# Patient Record
Sex: Female | Born: 1990 | Race: White | Hispanic: No | Marital: Married | Smoking: Never smoker
Health system: Southern US, Community
[De-identification: ages and names within clinical notes are randomized; demographics above are authoritative.]

## PROBLEM LIST (undated history)

## (undated) DIAGNOSIS — G43109 Migraine with aura, not intractable, without status migrainosus: Secondary | ICD-10-CM

## (undated) DIAGNOSIS — A6000 Herpesviral infection of urogenital system, unspecified: Secondary | ICD-10-CM

## (undated) DIAGNOSIS — F32A Depression, unspecified: Secondary | ICD-10-CM

## (undated) HISTORY — DX: Herpesviral infection of urogenital system, unspecified: A60.00

## (undated) HISTORY — DX: Depression, unspecified: F32.A

## (undated) HISTORY — DX: Migraine with aura, not intractable, without status migrainosus: G43.109

## (undated) HISTORY — PX: WISDOM TOOTH EXTRACTION: SHX21

---
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
## 2021-01-19 ENCOUNTER — Telehealth: Payer: Self-pay | Admitting: *Deleted

## 2021-01-19 NOTE — Telephone Encounter (Signed)
Left patient an urgent message to call the office to give and receive office information for New GYN appointment on 02/10/2021.

## 2021-02-07 NOTE — Progress Notes (Deleted)
   GYNECOLOGY OFFICE VISIT NOTE  History:   Monique Pruitt is a 30 y.o. No obstetric history on file. here today for multiple concerns:  Painful and heavy periods Recurrent UTIs    She denies any abnormal vaginal discharge, bleeding, pelvic pain or other concerns.     No past medical history on file.  *** The histories are not reviewed yet. Please review them in the "History" navigator section and refresh this SmartLink.  The following portions of the patient's history were reviewed and updated as appropriate: allergies, current medications, past family history, past medical history, past social history, past surgical history and problem list.   Health Maintenance:  Normal pap and negative HRHPV on ***.    Review of Systems:  Pertinent items noted in HPI and remainder of comprehensive ROS otherwise negative.  Physical Exam:  There were no vitals taken for this visit. CONSTITUTIONAL: Well-developed, well-nourished female in no acute distress.  HEENT:  Normocephalic, atraumatic. External right and left ear normal. No scleral icterus.  NECK: Normal range of motion, supple, no masses noted on observation SKIN: No rash noted. Not diaphoretic. No erythema. No pallor. MUSCULOSKELETAL: Normal range of motion. No edema noted. NEUROLOGIC: Alert and oriented to person, place, and time. Normal muscle tone coordination. No cranial nerve deficit noted. PSYCHIATRIC: Normal mood and affect. Normal behavior. Normal judgment and thought content.  CARDIOVASCULAR: Normal heart rate noted RESPIRATORY: Effort and breath sounds normal, no problems with respiration noted ABDOMEN: No masses noted. No other overt distention noted.    PELVIC: {Blank single:19197::"Deferred","Normal appearing external genitalia; normal urethral meatus; normal appearing vaginal mucosa and cervix.  No abnormal discharge noted.  Normal uterine size, no other palpable masses, no uterine or adnexal tenderness. Performed  in the presence of a chaperone"}  Labs and Imaging No results found for this or any previous visit (from the past 168 hour(s)). No results found.    Assessment and Plan:    There are no diagnoses linked to this encounter.  Routine preventative health maintenance measures emphasized. Please refer to After Visit Summary for other counseling recommendations.   No follow-ups on file.    I spent {Blank single:19197::"10","15","20","25","30"} minutes dedicated to the care of this patient including pre-visit review of records, face to face time with the patient discussing her conditions and treatments and post visit orders.    Milas Hock, MD, FACOG Obstetrician & Gynecologist, [facility] for [facility], [facility]

## 2021-02-09 ENCOUNTER — Telehealth: Payer: Self-pay | Admitting: *Deleted

## 2021-02-09 NOTE — Telephone Encounter (Signed)
Left patient an urgent message to call the office prior to appointment on 02/10/2021 at 2:10 PM for prior appointment questions.

## 2021-02-10 ENCOUNTER — Encounter: Payer: Commercial Managed Care - PPO | Admitting: Obstetrics and Gynecology

## 2021-03-01 ENCOUNTER — Emergency Department (INDEPENDENT_AMBULATORY_CARE_PROVIDER_SITE_OTHER): Payer: Commercial Managed Care - PPO

## 2021-03-01 ENCOUNTER — Emergency Department (INDEPENDENT_AMBULATORY_CARE_PROVIDER_SITE_OTHER)
Admission: RE | Admit: 2021-03-01 | Discharge: 2021-03-01 | Disposition: A | Discharge status: Home / Self Care | Payer: Commercial Managed Care - PPO | Source: Ambulatory Visit | Attending: Family Medicine | Admitting: Family Medicine

## 2021-03-01 ENCOUNTER — Other Ambulatory Visit: Payer: Self-pay

## 2021-03-01 VITALS — BP 126/86 | HR 87 | Temp 99.1°F

## 2021-03-01 DIAGNOSIS — M25562 Pain in left knee: Secondary | ICD-10-CM | POA: Diagnosis not present

## 2021-03-01 DIAGNOSIS — S83002A Unspecified subluxation of left patella, initial encounter: Secondary | ICD-10-CM

## 2021-03-01 MED ORDER — NAPROXEN SODIUM 550 MG PO TABS: 550.0000 mg | ORAL_TABLET | Freq: Two times a day (BID) | ORAL | 0 refills | Status: DC

## 2021-03-01 NOTE — Discharge Instructions (Signed)
Use ice for 20 minutes every couple of hours Take the Anaprox 2 times a day with food Stay off your leg as long as it is painful.  Hopefully this will improve over the next week or so See sports medicine if you fail to improve

## 2021-03-01 NOTE — ED Triage Notes (Addendum)
Pt c/o LT knee pain since Sunday afternoon when she twisted her knee. Says it appears swollen. Hurts to bend leg. Pain 3/10, worse when bending. Ibuprofen and ice prn.

## 2021-03-01 NOTE — ED Provider Notes (Signed)
[facility]    CSN: [id number] Arrival date & time: 03/01/21  1287      History   Chief Complaint Chief Complaint  Patient presents with   Knee Injury    LT    HPI Mikelle Myrick is a 30 y.o. female.   HPI  Patient states she was outdoors in a field, on uneven ground.  She was collecting chestnuts with a friend.  The friend tossed her 1, and as she reached to grab it she felt a twisting and pain in her left knee.  She fell to the ground.  Unable to fully weight-bear since that time.  Knee is very painful, swollen.  No prior knee problems.  Healthy 30 year old on no medications.  History reviewed. No pertinent past medical history.  There are no problems to display for this patient.   History reviewed. No pertinent surgical history.  OB History   No obstetric history on file.      Home Medications    Prior to Admission medications   Medication Sig Start Date End Date Taking? Authorizing Provider  naproxen sodium (ANAPROX DS) 550 MG tablet Take 1 tablet (550 mg total) by mouth 2 (two) times daily with a meal. 03/01/21  Yes Eustace Moore, MD    Family History History reviewed. No pertinent family history.  Social History Social History   Tobacco Use   Smoking status: Never   Smokeless tobacco: Never  Vaping Use   Vaping Use: Never used  Substance Use Topics   Alcohol use: Yes    Comment: occ     Allergies   Patient has no known allergies.   Review of Systems Review of Systems See HPI  Physical Exam Triage Vital Signs ED Triage Vitals  Enc Vitals Group     BP 03/01/21 0851 126/86     Pulse Rate 03/01/21 0851 87     Resp --      Temp 03/01/21 0851 99.1 F (37.3 C)     Temp Source 03/01/21 0851 Oral     SpO2 03/01/21 0851 96 %     Weight --      Height --      Head Circumference --      Peak Flow --      Pain Score 03/01/21 0853 3     Pain Loc --      Pain Edu? --      Excl. in GC? --    No data  found.  Updated Vital Signs BP 126/86 (BP Location: Right Arm)   Pulse 87   Temp 99.1 F (37.3 C) (Oral)   LMP 01/30/2021 (Exact Date)   SpO2 96%      Physical Exam Constitutional:      General: She is not in acute distress.    Appearance: She is well-developed.  HENT:     Head: Normocephalic and atraumatic.  Eyes:     Conjunctiva/sclera: Conjunctivae normal.     Pupils: Pupils are equal, round, and reactive to light.  Cardiovascular:     Rate and Rhythm: Normal rate.  Pulmonary:     Effort: Pulmonary effort is normal. No respiratory distress.  Abdominal:     General: There is no distension.     Palpations: Abdomen is soft.  Musculoskeletal:        General: Normal range of motion.     Cervical back: Normal range of motion.     Comments: Left knee has effusion.  Full  extension, can flex to 100 degrees.  No instability.  There is tenderness around the patella and pain with any patellar motion.  Skin:    General: Skin is warm and dry.  Neurological:     Mental Status: She is alert.     Gait: Gait abnormal.     UC Treatments / Results  Labs (all labs ordered are listed, but only abnormal results are displayed) Labs Reviewed - No data to display  EKG   Radiology DG Knee AP/LAT W/Sunrise Left  Result Date: 03/01/2021 CLINICAL DATA:  Possible patellar subluxation, left knee pain since Sunday EXAM: LEFT KNEE 3 VIEWS COMPARISON:  None. FINDINGS: There is no acute fracture or dislocation. Knee alignment is normal. Specifically, the patella is normally seated within the patellar groove. There is no effusion. The soft tissues are unremarkable. IMPRESSION: No acute fracture or dislocation.  Normal position of the patella. Electronically Signed   By: Lesia Hausen M.D.   On: 03/01/2021 09:58    Procedures Procedures (including critical care time)  Medications Ordered in UC Medications - No data to display  Initial Impression / Assessment and Plan / UC Course  I have  reviewed the triage vital signs and the nursing notes.  Pertinent labs & imaging results that were available during my care of the patient were reviewed by me and considered in my medical decision making (see chart for details).     Suspect patellar subluxation.  History and physical exam suggest.  I showed her some quadricep strengthening exercises that can be done seated, straight leg raise.  Follow-up with sports medicine Final Clinical Impressions(s) / UC Diagnoses   Final diagnoses:  Patellar subluxation, left, initial encounter     Discharge Instructions      Use ice for 20 minutes every couple of hours Take the Anaprox 2 times a day with food Stay off your leg as long as it is painful.  Hopefully this will improve over the next week or so See sports medicine if you fail to improve     ED Prescriptions     Medication Sig Dispense Auth. Provider   naproxen sodium (ANAPROX DS) 550 MG tablet Take 1 tablet (550 mg total) by mouth 2 (two) times daily with a meal. 30 tablet Eustace Moore, MD      PDMP not reviewed this encounter.   Eustace Moore, MD 03/01/21 1149

## 2021-03-07 ENCOUNTER — Telehealth: Payer: Self-pay | Admitting: *Deleted

## 2021-03-07 NOTE — Telephone Encounter (Signed)
Left patient a message with scheduled appointment information and location.

## 2021-03-15 ENCOUNTER — Encounter: Payer: Self-pay | Admitting: Obstetrics and Gynecology

## 2021-03-15 DIAGNOSIS — G43109 Migraine with aura, not intractable, without status migrainosus: Secondary | ICD-10-CM

## 2021-03-15 HISTORY — DX: Migraine with aura, not intractable, without status migrainosus: G43.109

## 2021-03-15 NOTE — Progress Notes (Signed)
GYNECOLOGY OFFICE VISIT NOTE  History:   Monique Pruitt is a 30 y.o. G0 here today for multiple concerns.   Painful heavy periods - since 19. For the first 3 days. Heating pad, ibuprofen/tylenol. Does 400mg  of ibu. Cycles are 36. + fmh of endometriosis. Taking iron which seems to be helpful. Uses the menstrual disc - empties each time she goes to the bathroom. When she used pads/tampons she used to change them between q2-4 with super tampons.  PMS - tracking for few months. Anxiety and depression, low energy after ovulation. Affects work and anhedonia. Happens more right after ovulation.  Recurrent UTIs - the last Ucx I have on file in [place] is 2020. In 2020, they thought at [facility] that perhaps she has IC. By symptoms was 2 months ago. Over a year ago, during covid she didn't feel comfortable getting in person care - has them multiple times and done over the telehelath. Saw an herbalist for 2 months ago, started on some supplement and resolved.    [facility] + Ovarian cancer - MGM.   She denies any abnormal vaginal discharge, bleeding, pelvic pain or other concerns.     Past Medical History:  Diagnosis Date   Depression    Genital HSV    Migraine headache with aura    Remote; last experienced over 5 years ago    Past Surgical History:  Procedure Laterality Date   WISDOM TOOTH EXTRACTION      The following portions of the patient's history were reviewed and updated as appropriate: allergies, current medications, past family history, past medical history, past social history, past surgical history and problem list.   Health Maintenance:   Normal pap 11/2018 in care [facility] at [facility].    Review of Systems:  Pertinent items noted in HPI and remainder of comprehensive ROS otherwise negative.  Physical Exam:  BP 115/78   Pulse 83   Ht 5\' 4"  (1.626 m)   Wt 168 lb (76.2 kg)   LMP 03/02/2021   BMI 28.84 kg/m  CONSTITUTIONAL: Well-developed, well-nourished female in no acute  distress.  HEENT:  Normocephalic, atraumatic. External right and left ear normal. No scleral icterus.  NECK: Normal range of motion, supple, no masses noted on observation SKIN: No rash noted. Not diaphoretic. No erythema. No pallor. MUSCULOSKELETAL: Normal range of motion. No edema noted. NEUROLOGIC: Alert and oriented to person, place, and time. Normal muscle tone coordination. No cranial nerve deficit noted. PSYCHIATRIC: Normal mood and affect. Normal behavior. Normal judgment and thought content.  CARDIOVASCULAR: Normal heart rate noted RESPIRATORY: Effort and breath sounds normal, no problems with respiration noted ABDOMEN: No masses noted. No other overt distention noted.    PELVIC: Deferred  Labs and Imaging  Assessment and Plan:    1. Painful menstrual periods - Discussed options for comfort - continued heating pad/tylenol/ibuprofen - Discussed may be endometriosis vs primary dysmenorrhea.  - Reduction in bleeding amount may be beneficial.  - Check pelvic to confirm no endometrioma. Discussed limit of 03/04/2021 for diagnosing endometriosis but goal would be to rule out need for surgical intervention.   2. Menorrhagia with regular cycle - She would like to continue lifestyle changes but also interested in [place]. We also discussed hormonal options. She does not wish to take medicine regularly at this time.   3. Recurrent UTI - Not a current issue - recent UTI improved without ABX - We will check Ucx today as a precaution  4. PMS (premenstrual syndrome) -  Discussed different options available. Discussed two main options include hormonal birth control and SSRI i.e. prozac.  - For hormonal birth control, Dianah Field is FDA approved but other birth control may help as well.  Discussed risk of VTE with Yaz. Discussed may do an option like Mirena to make periods light or no period at all and may help with mood as well although not FDA approved but can combine birth control with SSRI as well.   - Discussed Prozac is typically taken the week before the period and has shown benefit for these symptoms. Discussed difference in dosing from depression. She could move to when her symptoms peak as well.  - Reviewed trial and error component of management and to give each option about 2-3 months to determine effectiveness.  - She would like Expectant management and lifestyle changes at this time  - She would also like nutrition referral   5. Migraine with aura and without status migrainosus, not intractable - Not an active problem at this time but we discussed risk of combined OCP risk of stroke even with prior history and not active.    Routine preventative health maintenance measures emphasized. Please refer to After Visit Summary for other counseling recommendations.   Return in about 3 months (around 06/17/2021).   Milas Hock, MD, FACOG Obstetrician & Gynecologist, [facility] for [facility], [facility]

## 2021-03-17 ENCOUNTER — Encounter: Payer: Self-pay | Admitting: Obstetrics and Gynecology

## 2021-03-17 ENCOUNTER — Ambulatory Visit (INDEPENDENT_AMBULATORY_CARE_PROVIDER_SITE_OTHER): Payer: Commercial Managed Care - PPO | Admitting: Obstetrics and Gynecology

## 2021-03-17 ENCOUNTER — Other Ambulatory Visit: Payer: Self-pay

## 2021-03-17 VITALS — BP 115/78 | HR 83 | Ht 64.0 in | Wt 168.0 lb

## 2021-03-17 DIAGNOSIS — G43109 Migraine with aura, not intractable, without status migrainosus: Secondary | ICD-10-CM

## 2021-03-17 DIAGNOSIS — N946 Dysmenorrhea, unspecified: Secondary | ICD-10-CM | POA: Diagnosis not present

## 2021-03-17 DIAGNOSIS — N943 Premenstrual tension syndrome: Secondary | ICD-10-CM | POA: Diagnosis not present

## 2021-03-17 DIAGNOSIS — N390 Urinary tract infection, site not specified: Secondary | ICD-10-CM | POA: Diagnosis not present

## 2021-03-17 DIAGNOSIS — N920 Excessive and frequent menstruation with regular cycle: Secondary | ICD-10-CM | POA: Diagnosis not present

## 2021-03-17 MED ORDER — TRANEXAMIC ACID 650 MG PO TABS: 1300.0000 mg | ORAL_TABLET | Freq: Three times a day (TID) | ORAL | 2 refills | Status: DC

## 2021-03-17 NOTE — Progress Notes (Signed)
Pt states periods are regular but painful

## 2021-03-19 LAB — URINE CULTURE
MICRO NUMBER:: 12624537
Result:: NO GROWTH
SPECIMEN QUALITY:: ADEQUATE

## 2021-03-22 ENCOUNTER — Encounter: Payer: Commercial Managed Care - PPO | Attending: Obstetrics and Gynecology | Admitting: Registered"

## 2021-03-22 ENCOUNTER — Other Ambulatory Visit: Payer: Self-pay

## 2021-03-22 ENCOUNTER — Ambulatory Visit: Payer: Commercial Managed Care - PPO | Admitting: Registered"

## 2021-03-22 DIAGNOSIS — N946 Dysmenorrhea, unspecified: Secondary | ICD-10-CM | POA: Insufficient documentation

## 2021-03-22 NOTE — Progress Notes (Signed)
Medical Nutrition Therapy  Appointment Start time:  [phone]  Appointment End time:  1010  Primary concerns today: PMS, painful periods  Referral diagnosis: N94.6 (ICD-10-CM) - Painful menstrual periods Preferred learning style: no preference indicated Learning readiness: ready  NUTRITION ASSESSMENT   Anthropometrics  Wt Readings from Last 3 Encounters:  03/17/21 168 lb (76.2 kg)      Clinical Medical Hx: migraine, painful menstrual periods Medications: reviewed Labs: no recent blood work Notable Signs/Symptoms: hypoglycemic sxs, painful periods  Lifestyle & Dietary Hx Pt states last summer she consulted an herbalist and started B complex and iron supplement. Pt states she did not notice much difference except periods were a little lighter flow and brighter red. Pt state sfirst 3 days of period feels like an internal blender. Pt reports she will be tested for endometriosis soon.   Pt also reports drop in mood after ovulation.   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 Pt works from home 3x/week.  Physical activity: ADLs x1 month due to knee injury. Pt state prior to that was going to gym 3x/week with 30 min cardio/30 min weights.  Estimated daily fluid intake: 56-72 oz Supplements: none (was B complex and iron) Sleep: 7-9 hrs (feels better with 9 hours) Stress / self-care: 4/10 Current average weekly physical activity: ADL  24-Hr Dietary Recall First Meal: Bagel with cream cheese OR fruit Snack: fruit and peanut butter OR chips Second Meal: left overs OR goes to deli nearby work Snack: same Third Meal: soup or chili or pasta Snack: piece of chocolate and whole milk Beverages: mostly water, some milk and orange juice  Estimated Energy Needs Calories: 2200   NUTRITION DIAGNOSIS  NI-5.8.5 Inadeqate fiber intake As related to lack of whole grains and daily vegetable intake.  As evidenced by dietary recall.   NUTRITION INTERVENTION  Nutrition education  (E-1) on the following topics:  Balanced eating  Whole grains importance in diet and sources Foods that may help increase serotonin Foods with iron Meal planning Exercise role in mood  Handouts Provided Include  AND Iron deficiency MNT  Learning Style & Readiness for Change Teaching method utilized: Visual & Auditory  Demonstrated degree of understanding via: Teach Back  Barriers to learning/adherence to lifestyle change: none  Goals Established by Pt Aim for 2 vegetable servings per day, mix of starchy and non-starchy.  Consider including more [place], salmon, nuts, seeds, eggs.  Whole grains: try some new each week [employer]  Breakfast: Plan ahead for the week for breakfast ideas: Plain [place] yogurt, berries, granola Overnight oats, chia seeds or flaxseeds, protein powder (PB fit, whey protein powder, or plant based such as orgain) Egg bites (Homemade), hardboiled, with meat [place] sausage (home made: 1/2 lb burger 1 tsp sage, 1/2 tsp cinnamon, 1/2 tsp thyme, 1/2 tsp salt, (1/2 rosemary, optional) Whole grain toast with PB - (sprouted wheat is delicious)  Lunch: More leftovers  Sodium: read labels    MONITORING & EVALUATION Dietary intake, weekly physical activity, and PMS sxs in prn.  Next Steps  Patient is to contact office if would like follow-up appt.

## 2021-03-22 NOTE — Patient Instructions (Signed)
Aim for 2 vegetable servings per day, mix of starchy and non-starchy.  Consider including more [place], salmon, nuts, seeds, eggs.  Whole grains: try some new each week [employer]  Breakfast: Plan ahead for the week for breakfast ideas: Plain [place] yogurt, berries, granola Overnight oats, chia seeds or flaxseeds, protein powder (PB fit, whey protein powder, or plant based such as orgain) Egg bites (Homemade), hardboiled, with meat [place] sausage (home made: 1/2 lb burger 1 tsp sage, 1/2 tsp cinnamon, 1/2 tsp thyme, 1/2 tsp salt, (1/2 rosemary, optional) Whole grain toast with PB - (sprouted wheat is delicious)  Lunch: More leftovers  Sodium: read labels

## 2021-03-28 ENCOUNTER — Other Ambulatory Visit: Payer: Self-pay

## 2021-06-17 ENCOUNTER — Telehealth: Payer: Self-pay | Admitting: *Deleted

## 2021-06-17 NOTE — Telephone Encounter (Signed)
Left patient a message to call and schedule. 

## 2021-10-06 NOTE — Progress Notes (Signed)
ANNUAL EXAM Patient name: Monique Pruitt MRN [id number]  Date of birth: 1991-02-12 Chief Complaint:   Annual Exam  History of Present Illness:   Monique Pruitt is a 31 y.o. G0P0000 female being seen today for a routine annual exam.   Current complaints: Since last seen her periods have been about the same but now she had significant pain with intercourse which was most in May. Her pain has eased some this month. She did not yet get the [place] but would still like to go. She declined intervention at the time in November opting for expectant management/lifestyle changes. She is tearful today regarding the pain she is experiencing.   Patient's last menstrual period was 09/27/2021.  Last pap 11/2018. Results were: NILM w/ HRHPV not done. H/O abnormal pap: no  Health Maintenance Due  Topic Date Due   COVID-19 Vaccine (1) Never done   HIV Screening  Never done   Hepatitis C Screening  Never done   PAP-Cervical Cytology Screening  Never done   PAP SMEAR-Modifier  Never done   TETANUS/TDAP  08/14/2016         View : No data to display.               View : No data to display.           Review of Systems:   Pertinent items are noted in HPI Denies any headaches, blurred vision, fatigue, shortness of breath, chest pain, abdominal pain, abnormal vaginal discharge/itching/odor/irritation, bowel movements, urination, or intercourse unless otherwise stated above.  Pertinent History Reviewed:  Reviewed past medical,surgical, social and family history.  Reviewed problem list, medications and allergies. Physical Assessment:   Vitals:   10/10/21 1343  BP: 127/71  Pulse: 76  Resp: 16  Weight: 162 lb (73.5 kg)  Height: 5\' 4"  (1.626 m)  Body mass index is 27.81 kg/m.   Physical Examination:  General appearance - well appearing, and in no distress Mental status - alert, oriented to person, place, and time Psych:  She has a normal mood and affect Skin - warm and  dry, normal color, no suspicious lesions noted Chest - effort normal, all lung fields clear to auscultation bilaterally Heart - normal rate and regular rhythm Neck:  midline trachea, no thyromegaly or nodules Breasts - breasts appear normal, no suspicious masses, no skin or nipple changes or axillary nodes Abdomen - soft, nontender, nondistended, no masses or organomegaly Pelvic -  VULVA: normal appearing vulva with no masses, tenderness or lesions  VAGINA: normal appearing vagina with normal color and discharge, no lesions, bilateral levator tenderness and left>right and obturator tender CERVIX: normal appearing cervix without discharge or lesions, no CMT UTERUS: uterus is felt to be normal size, shape, consistency and nontender  ADNEXA: No adnexal masses noted. Tenderness noted bilaterally but especially on the left Extremities:  No swelling or varicosities noted  Chaperone present for exam  No results found for this or any previous visit (from the past 24 hour(s)).  Assessment & Plan:  Hula was seen today for annual exam.  Diagnoses and all orders for this visit:  Encounter for annual routine gynecological examination - Cervical cancer screening: Discussed screening Q3 years. Reviewed importance of annual exams and limits of pap smear. Pap with reflex HPV  - GC/CT: Discussed and recommended. Pt  declines - Gardasil: completed - Birth Control: Declines - Breast Health: Encouraged self breast awareness/exams. Teaching provided. - Follow-up: 12 months and prn -  Cytology - PAP( [place])  Painful menstrual periods - Discussed differential: i.e. secondary dysmenorrhea vs endometriosis vs other etiology. Most likely she has endometriosis.  - Discussed only way to tell definitively regarding endometriosis is either by US showing an endometrioma or by laparoscopy. Would advise against surgery unless other treatment options have failed or concerns regarding infertility. -  Discussed treatment options: Motrin or Naprosyn prn severe cramps along with tylenol, hormonal options. We discussed the role of PFPT.  - We also discussed possible prevention of progression of endometriosis if hormonal therapy.  - We reviewed use of laparoscopy in the assistance of fertility if endometriosis but that would only be after she has tried for at least 6 months.  - At this time, she would like to do: PFPT and NSAIDs -     naproxen (NAPROSYN) 500 MG tablet; Take 1 tablet (500 mg total) by mouth 2 (two) times daily with a meal. As needed for pain - She will also go for TVUS -     Ambulatory referral to Physical Therapy  Pelvic pain -     Cancel: Ambulatory referral to Physical Therapy -     naproxen (NAPROSYN) 500 MG tablet; Take 1 tablet (500 mg total) by mouth 2 (two) times daily with a meal. As needed for pain -     Ambulatory referral to Physical Therapy      Orders Placed This Encounter  Procedures   Ambulatory referral to Physical Therapy    Meds:  Meds ordered this encounter  Medications   naproxen (NAPROSYN) 500 MG tablet    Sig: Take 1 tablet (500 mg total) by mouth 2 (two) times daily with a meal. As needed for pain    Dispense:  60 tablet    Refill:  2    Follow-up: No follow-ups on file.  Milas Hock, MD 10/10/2021 2:44 PM

## 2021-10-10 ENCOUNTER — Encounter: Payer: Self-pay | Admitting: Obstetrics and Gynecology

## 2021-10-10 ENCOUNTER — Ambulatory Visit (INDEPENDENT_AMBULATORY_CARE_PROVIDER_SITE_OTHER): Payer: Commercial Managed Care - PPO | Admitting: Obstetrics and Gynecology

## 2021-10-10 ENCOUNTER — Other Ambulatory Visit (HOSPITAL_COMMUNITY)
Admission: RE | Admit: 2021-10-10 | Discharge: 2021-10-10 | Disposition: A | Discharge status: Home / Self Care | Payer: Commercial Managed Care - PPO | Source: Ambulatory Visit | Attending: Obstetrics and Gynecology | Admitting: Obstetrics and Gynecology

## 2021-10-10 VITALS — BP 127/71 | HR 76 | Resp 16 | Ht 64.0 in | Wt 162.0 lb

## 2021-10-10 DIAGNOSIS — Z01419 Encounter for gynecological examination (general) (routine) without abnormal findings: Secondary | ICD-10-CM

## 2021-10-10 DIAGNOSIS — N946 Dysmenorrhea, unspecified: Secondary | ICD-10-CM

## 2021-10-10 DIAGNOSIS — R102 Pelvic and perineal pain: Secondary | ICD-10-CM | POA: Diagnosis not present

## 2021-10-10 MED ORDER — NAPROXEN 500 MG PO TABS: 500.0000 mg | ORAL_TABLET | Freq: Two times a day (BID) | ORAL | 2 refills | Status: DC

## 2021-10-11 ENCOUNTER — Encounter: Payer: Self-pay | Admitting: Physical Therapy

## 2021-10-11 ENCOUNTER — Ambulatory Visit: Payer: Commercial Managed Care - PPO | Attending: Obstetrics and Gynecology | Admitting: Physical Therapy

## 2021-10-11 ENCOUNTER — Encounter: Payer: Self-pay | Admitting: Obstetrics and Gynecology

## 2021-10-11 DIAGNOSIS — M6281 Muscle weakness (generalized): Secondary | ICD-10-CM | POA: Diagnosis present

## 2021-10-11 DIAGNOSIS — N946 Dysmenorrhea, unspecified: Secondary | ICD-10-CM | POA: Insufficient documentation

## 2021-10-11 DIAGNOSIS — M62838 Other muscle spasm: Secondary | ICD-10-CM | POA: Diagnosis present

## 2021-10-11 DIAGNOSIS — R102 Pelvic and perineal pain: Secondary | ICD-10-CM | POA: Insufficient documentation

## 2021-10-11 DIAGNOSIS — R293 Abnormal posture: Secondary | ICD-10-CM | POA: Insufficient documentation

## 2021-10-11 DIAGNOSIS — R279 Unspecified lack of coordination: Secondary | ICD-10-CM

## 2021-10-11 LAB — CYTOLOGY - PAP: Diagnosis: NEGATIVE

## 2021-10-11 NOTE — Therapy (Signed)
OUTPATIENT PHYSICAL THERAPY FEMALE PELVIC EVALUATION   Patient Name: Monique Pruitt MRN: [id number] DOB:[birth year], 31 y.o., female Today's Date: 10/11/2021   PT End of Session - 10/11/21 0845     Visit Number 1    Date for PT Re-Evaluation 01/11/22    Authorization Type [employer]    PT Start Time 0845    PT Stop Time 0923    PT Time Calculation (min) 38 min    Activity Tolerance Patient tolerated treatment well    Behavior During Therapy WFL for tasks assessed/performed             Past Medical History:  Diagnosis Date   Depression    Genital HSV    Migraine headache with aura    Remote; last experienced over 5 years ago   Past Surgical History:  Procedure Laterality Date   WISDOM TOOTH EXTRACTION     Patient Active Problem List   Diagnosis Date Noted   Painful menstrual periods 03/22/2021   Migraine with aura 03/15/2021   Acne 12/29/2008   Soft tissue lesion of shoulder region 02/17/2008    PCP: none per chart  REFERRING PROVIDER: Milas Hock, MD   REFERRING DIAG: N94.6 (ICD-10-CM) - Painful menstrual periods R10.2 (ICD-10-CM) - Pelvic pain  THERAPY DIAG:  Muscle weakness (generalized)  Unspecified lack of coordination  Other muscle spasm  Rationale for Evaluation and Treatment Rehabilitation  ONSET DATE: one month  SUBJECTIVE:                                                                                                                                                                                           SUBJECTIVE STATEMENT: Pt reports she has had worsening pelvic pain starting with painful periods for years but now having soreness all the time, very painful intercourse for the past month. Periods pain has stayed the same.  Fluid intake: Yes: 48 oz of water per day, sometimes tea    Patient confirms identification and approves PT to assess pelvic floor and treatment Yes   PAIN:  Are you having pain? Yes NPRS scale: 7/10 at  worst Pain location:  abdominal Lt lower quadrant  Pain type: sharp Pain description:  sharp/severity is intermittent but soreness is constant    Aggravating factors: intercourse, periods  Relieving factors: with periods a heating pad helps this pain nothing has been helping  PRECAUTIONS: None  WEIGHT BEARING RESTRICTIONS No  FALLS:  Has patient fallen in last 6 months? No  LIVING ENVIRONMENT: Lives with: lives with their partner Lives in: House/apartment   OCCUPATION: patent Engineer, [employer]  PLOF: Independent  PATIENT GOALS to have  less pain  PERTINENT HISTORY:  Possible endometriosis  Sexual abuse: No  BOWEL MOVEMENT Pain with bowel movement: No Type of bowel movement:Type (Bristol Stool Scale) 4, Frequency 5x per week, and Strain No Fully empty rectum: No Leakage: No Pads: No Fiber supplement: No  URINATION Pain with urination: No Fully empty bladder: Yes:   Stream: Strong Urgency: No Frequency: every 2 hours Leakage:  none Pads: No  INTERCOURSE Pain with intercourse: Initial Penetration, During Penetration, and After Intercourse Ability to have vaginal penetration:  Yes: but painful Climax: not painful but is limited in ability to climax due to pain Marinoff Scale: 3/3  PREGNANCY Vaginal deliveries 0 Tearing No C-section deliveries 0 Currently pregnant No  PROLAPSE None    OBJECTIVE:   DIAGNOSTIC FINDINGS:    COGNITION:  Overall cognitive status: Within functional limits for tasks assessed     SENSATION:  Light touch: Appears intact  Proprioception: Appears intact  MUSCLE LENGTH: Bil hamstrings and adductors limited by 25%  POSTURE:  Rounded shoulders, posterior pelvic tilt  LUMBARAROM/PROM  WFL but mild stiffness reported   LOWER EXTREMITY ROM:    WFL  LOWER EXTREMITY MMT:      Rt hip 4/5 and Lt hip 3+/5; bil knees and ankles 5/5  PELVIC MMT:  pt deferred as she is still sore from internal with MD yesterday   MMT eval   Vaginal   Internal Anal Sphincter   External Anal Sphincter   Puborectalis   Diastasis Recti   (Blank rows = not tested)        PALPATION:   General  TTP at Lt lower abdominal quadrant and Lt anterior pelvis, pt ticklish and hard to assess fascia at abdomen                 External Perineal Exam pt would like wait for internal after having internal with MD yesterday and feeling "inflamed"                              Internal Pelvic Floor deferred   TONE: Deferred   PROLAPSE: Deferred   TODAY'S TREATMENT  10/11/2021 EVAL Examination completed, findings reviewed, pt educated on POC, HEP. Pt motivated to participate in PT and agreeable to attempt recommendations.     PATIENT EDUCATION:  Education details: [id number] Person educated: Patient Education method: Programmer, multimedia, Facilities manager, Actor cues, Verbal cues, and Handouts Education comprehension: verbalized understanding and returned demonstration   HOME EXERCISE PROGRAM: [id number]  ASSESSMENT:  CLINICAL IMPRESSION: Patient is a 31 y.o. female  who was seen today for physical therapy evaluation and treatment for pelvic pain with periods, intercourse, vaginal penetration, and has soreness constantly. Per chart review and pt reports endometriosis may be a diagnosis  contributing to pain, MD found internal tenderness at bil pelvic floor internally but worse at Lt side. Pt deferred internal with PT today as she was still sore from yesterday's assessment with MD. Pt found to have decreased flexiblity in bil hips and spine, decreased hip strength, decreased core strength, pain with palpation at lt side of abdomen/pelvis anteriorly. Pt would benefit from additional PT to further address deficits.     OBJECTIVE IMPAIRMENTS decreased coordination, decreased endurance, decreased strength, increased fascial restrictions, increased muscle spasms, impaired flexibility, improper body mechanics, postural dysfunction, and pain.   ACTIVITY  LIMITATIONS  intercourse/all vaginal penetration  PARTICIPATION LIMITATIONS: interpersonal relationship  PERSONAL FACTORS Time since onset of injury/illness/exacerbation and 1 comorbidity: possible  endometriosis   are also affecting patient's functional outcome.   REHAB POTENTIAL: Good  CLINICAL DECISION MAKING: Stable/uncomplicated  EVALUATION COMPLEXITY: Low   GOALS: Goals reviewed with patient? Yes  SHORT TERM GOALS: Target date: 11/08/2021  Pt to be I with HEP.  Baseline: Goal status: INITIAL  2.  Pt to be I with recall of voiding mechanics and breathing mechanics to improve ability to fully void bowels and promote improved pelvic floor relaxation.  Baseline:  Goal status: INITIAL  3.  Pt to report no more than 6/10 pelvic pain for improved QOL.  Baseline:  Goal status: INITIAL    LONG TERM GOALS: Target date: 01/11/2022   Pt to be I with advanced HEP.  Baseline:  Goal status: INITIAL  2.  Pt to demonstrate at least 5/5 bil hip strength for improved pelvic stability and functional squats without pain.  Baseline:  Goal status: INITIAL  3.  Pt to report tolerance to vaginal penetration at size of vaginal dilator 5 or equivalent for improved ability to tolerate pelvic assessments medically and for intercourse.  Baseline:  Goal status: INITIAL  4.  Pt to report no more than 3/10 pelvic pain for improved QOL.  Baseline:  Goal status: INITIAL    PLAN: PT FREQUENCY: 1x/week  PT DURATION:  8 sessions  PLANNED INTERVENTIONS: Therapeutic exercises, Therapeutic activity, Neuromuscular re-education, Patient/Family education, Joint mobilization, Aquatic Therapy, Dry Needling, Spinal mobilization, Cryotherapy, Moist heat, Manual lymph drainage, scar mobilization, Taping, Biofeedback, and Manual therapy  PLAN FOR NEXT SESSION: manual internally if pt consents and appropriate, manual abdomen/ stretching   Otelia SergeantHaley Emorie Mcfate, PT, DPT 06/06/239:23 AM

## 2021-10-14 ENCOUNTER — Ambulatory Visit (INDEPENDENT_AMBULATORY_CARE_PROVIDER_SITE_OTHER): Payer: Commercial Managed Care - PPO

## 2021-10-14 DIAGNOSIS — N920 Excessive and frequent menstruation with regular cycle: Secondary | ICD-10-CM

## 2021-10-14 DIAGNOSIS — N946 Dysmenorrhea, unspecified: Secondary | ICD-10-CM | POA: Diagnosis not present

## 2021-10-21 ENCOUNTER — Ambulatory Visit: Payer: Commercial Managed Care - PPO | Admitting: Physical Therapy

## 2021-10-21 DIAGNOSIS — M6281 Muscle weakness (generalized): Secondary | ICD-10-CM

## 2021-10-21 DIAGNOSIS — R279 Unspecified lack of coordination: Secondary | ICD-10-CM

## 2021-10-21 DIAGNOSIS — R293 Abnormal posture: Secondary | ICD-10-CM

## 2021-10-21 NOTE — Therapy (Signed)
OUTPATIENT PHYSICAL THERAPY FEMALE PELVIC TREATMENT   Patient Name: Monique Pruitt MRN: [id number] DOB:April 29, 1991, 31 y.o., female Today's Date: 10/21/2021   PT End of Session - 10/21/21 0844     Visit Number 2    Date for PT Re-Evaluation 01/11/22    Authorization Type [employer]    PT Start Time 0845    PT Stop Time 0925    PT Time Calculation (min) 40 min    Activity Tolerance Patient tolerated treatment well    Behavior During Therapy WFL for tasks assessed/performed             Past Medical History:  Diagnosis Date   Depression    Genital HSV    Migraine headache with aura    Remote; last experienced over 5 years ago   Past Surgical History:  Procedure Laterality Date   WISDOM TOOTH EXTRACTION     Patient Active Problem List   Diagnosis Date Noted   Painful menstrual periods 03/22/2021   Migraine with aura 03/15/2021   Acne 12/29/2008   Soft tissue lesion of shoulder region 02/17/2008    PCP: none per chart  REFERRING PROVIDER: Milas Hock, MD   REFERRING DIAG: N94.6 (ICD-10-CM) - Painful menstrual periods R10.2 (ICD-10-CM) - Pelvic pain  THERAPY DIAG:  Muscle weakness (generalized)  Abnormal posture  Unspecified lack of coordination  Rationale for Evaluation and Treatment Rehabilitation  ONSET DATE: one month  SUBJECTIVE:                                                                                                                                                                                           SUBJECTIVE STATEMENT: Pt reports she had been in pretty consistent pain until a few days ago, unable to sleep on Lt side due to increased pressure felt. Increased pain with cramping for a bowel movement. Pt reports HEP has been really helpful with stretching abdomen.   Fluid intake: Yes: 48 oz of water per day, sometimes tea    Patient confirms identification and approves PT to assess pelvic floor and treatment Yes   PAIN:  Are you  having pain? Yes NPRS scale: 1/10 currently, feels much better than a few days ago. Pain location:  abdominal Lt lower quadrant  Pain type: sharp Pain description:  sharp/severity is intermittent but soreness is constant    Aggravating factors: intercourse, periods  Relieving factors: with periods a heating pad helps this pain nothing has been helping  PRECAUTIONS: None   PATIENT GOALS to have less pain  PERTINENT HISTORY:  Possible endometriosis  Sexual abuse: No  BOWEL MOVEMENT Pain with bowel movement: No  Type of bowel movement:Type (Bristol Stool Scale) 4, Frequency 5x per week, and Strain No Fully empty rectum: No Leakage: No Pads: No Fiber supplement: No  URINATION Pain with urination: No Fully empty bladder: Yes:   Stream: Strong Urgency: No Frequency: every 2 hours Leakage:  none Pads: No  INTERCOURSE Pain with intercourse: Initial Penetration, During Penetration, and After Intercourse Ability to have vaginal penetration:  Yes: but painful Climax: not painful but is limited in ability to climax due to pain Marinoff Scale: 3/3  PREGNANCY Vaginal deliveries 0 Tearing No C-section deliveries 0 Currently pregnant No  PROLAPSE None    OBJECTIVE:   DIAGNOSTIC FINDINGS:    COGNITION:  Overall cognitive status: Within functional limits for tasks assessed     SENSATION:  Light touch: Appears intact  Proprioception: Appears intact  MUSCLE LENGTH: Bil hamstrings and adductors limited by 25%  POSTURE:  Rounded shoulders, posterior pelvic tilt  LUMBARAROM/PROM  WFL but mild stiffness reported   LOWER EXTREMITY ROM:    WFL  LOWER EXTREMITY MMT:      Rt hip 4/5 and Lt hip 3+/5; bil knees and ankles 5/5  PELVIC MMT:    pt deferred as she is still sore from internal with MD yesterday (time of eval)   MMT eval  Vaginal   Internal Anal Sphincter   External Anal Sphincter   Puborectalis   Diastasis Recti   (Blank rows = not tested)         PALPATION:   General  TTP at Lt lower abdominal quadrant and Lt anterior pelvis, pt ticklish and hard to assess fascia at abdomen                 External Perineal Exam pt would like wait for internal after having internal with MD yesterday and feeling "inflamed"                              Internal Pelvic Floor deferred (eval)    10/21/2021: slightly increased tone at Rt side; see treatment for details.    TONE: Deferred (eval)  PROLAPSE: Deferred (eval)  TODAY'S TREATMENT   10/21/2021: Pt consented to internal vaginal treatment and assessment this date, pt educated on female pelvic floor with model use for anatomy. Externally pt reported TTP to at Lt proximal adductor, anterior pelvic region but "not painful, just pressure". Pt consented to internal: TTP at Lt bulbocavernosus, pubococcygeus, iliococcygeus, and Lt obturator internus. Pt had trigger points at obturator,  pubococcygeus, iliococcygeus and all released with gentle overpressure. Pt reports she could feel the release and felt much less pressure and tenderness both internally and externally at Lt abdomen and pelvic area.  Post internal treatment pt agreeable to external lower abdominal soft tissue massage to decrease tension and improve mobility with less pain. Pt responded well to treatment and reported feeling much better at end of session.   10/11/2021 EVAL Examination completed, findings reviewed, pt educated on POC, HEP. Pt motivated to participate in PT and agreeable to attempt recommendations.     PATIENT EDUCATION:  Education details: [id number], female pelvic floor anatomy with model used.  Person educated: Patient Education method: Explanation, Demonstration, Tactile cues, Verbal cues, and Handouts Education comprehension: verbalized understanding and returned demonstration   HOME EXERCISE PROGRAM: [id number]  ASSESSMENT:  CLINICAL IMPRESSION: Patient reports to clinic she had high pain levels this past week but  started feeling better a few days ago. Pt consented  to internal treatment this date and found to have increased tension throughout pelvic floor but worse at Lt side and this is where pt reported her pain. See treatment note for details, but pt responded well to internal and external manual work today, reported less pain and tolerated well. All trigger points released with pt reports noted less tension and feeling better. Pt would benefit from additional PT to further address deficits.     OBJECTIVE IMPAIRMENTS decreased coordination, decreased endurance, decreased strength, increased fascial restrictions, increased muscle spasms, impaired flexibility, improper body mechanics, postural dysfunction, and pain.   ACTIVITY LIMITATIONS  intercourse/all vaginal penetration  PARTICIPATION LIMITATIONS: interpersonal relationship  PERSONAL FACTORS Time since onset of injury/illness/exacerbation and 1 comorbidity: possible endometriosis   are also affecting patient's functional outcome.   REHAB POTENTIAL: Good  CLINICAL DECISION MAKING: Stable/uncomplicated  EVALUATION COMPLEXITY: Low   GOALS: Goals reviewed with patient? Yes  SHORT TERM GOALS: Target date: 11/08/2021  Pt to be I with HEP.  Baseline: Goal status: INITIAL  2.  Pt to be I with recall of voiding mechanics and breathing mechanics to improve ability to fully void bowels and promote improved pelvic floor relaxation.  Baseline:  Goal status: INITIAL  3.  Pt to report no more than 6/10 pelvic pain for improved QOL.  Baseline:  Goal status: INITIAL    LONG TERM GOALS: Target date: 01/11/2022   Pt to be I with advanced HEP.  Baseline:  Goal status: INITIAL  2.  Pt to demonstrate at least 5/5 bil hip strength for improved pelvic stability and functional squats without pain.  Baseline:  Goal status: INITIAL  3.  Pt to report tolerance to vaginal penetration at size of vaginal dilator 5 or equivalent for improved ability to  tolerate pelvic assessments medically and for intercourse.  Baseline:  Goal status: INITIAL  4.  Pt to report no more than 3/10 pelvic pain for improved QOL.  Baseline:  Goal status: INITIAL    PLAN: PT FREQUENCY: 1x/week  PT DURATION:  8 sessions  PLANNED INTERVENTIONS: Therapeutic exercises, Therapeutic activity, Neuromuscular re-education, Patient/Family education, Joint mobilization, Aquatic Therapy, Dry Needling, Spinal mobilization, Cryotherapy, Moist heat, Manual lymph drainage, scar mobilization, Taping, Biofeedback, and Manual therapy  PLAN FOR NEXT SESSION: manual internally if pt consents and appropriate, manual abdomen/ stretching Otelia Sergeant, PT, DPT 06/16/239:47 AM

## 2021-11-10 ENCOUNTER — Ambulatory Visit: Payer: Commercial Managed Care - PPO | Attending: Obstetrics and Gynecology | Admitting: Physical Therapy

## 2021-11-10 DIAGNOSIS — M6281 Muscle weakness (generalized): Secondary | ICD-10-CM | POA: Diagnosis present

## 2021-11-10 DIAGNOSIS — R279 Unspecified lack of coordination: Secondary | ICD-10-CM | POA: Insufficient documentation

## 2021-11-10 DIAGNOSIS — R293 Abnormal posture: Secondary | ICD-10-CM | POA: Diagnosis present

## 2021-11-10 NOTE — Therapy (Signed)
OUTPATIENT PHYSICAL THERAPY FEMALE PELVIC TREATMENT   Patient Name: Monique Pruitt MRN: [id number] DOB:[birth year], 31 y.o., female Today's Date: 11/10/2021   PT End of Session - 11/10/21 0756     Visit Number 3    Date for PT Re-Evaluation 01/11/22    Authorization Type [employer]    PT Start Time 0800    PT Stop Time 0838    PT Time Calculation (min) 38 min    Activity Tolerance Patient tolerated treatment well    Behavior During Therapy WFL for tasks assessed/performed             Past Medical History:  Diagnosis Date   Depression    Genital HSV    Migraine headache with aura    Remote; last experienced over 5 years ago   Past Surgical History:  Procedure Laterality Date   WISDOM TOOTH EXTRACTION     Patient Active Problem List   Diagnosis Date Noted   Painful menstrual periods 03/22/2021   Migraine with aura 03/15/2021   Acne 12/29/2008   Soft tissue lesion of shoulder region 02/17/2008    PCP: none per chart  REFERRING PROVIDER: Milas Hock, MD   REFERRING DIAG: N94.6 (ICD-10-CM) - Painful menstrual periods R10.2 (ICD-10-CM) - Pelvic pain  THERAPY DIAG:  Muscle weakness (generalized)  Abnormal posture  Unspecified lack of coordination  Rationale for Evaluation and Treatment Rehabilitation  ONSET DATE: one month  SUBJECTIVE:                                                                                                                                                                                           SUBJECTIVE STATEMENT: "I've been doing really good." Pt reports since internal has had much lower pain levels overall and even with intercourse last week and only had 1/10 pain during. Pt also has had period since this and this pain wasn't as bad either.   Fluid intake: Yes: 48 oz of water per day, sometimes tea    Patient confirms identification and approves PT to assess pelvic floor and treatment Yes   PAIN:  Are you having pain?  No  PRECAUTIONS: None   PATIENT GOALS to have less pain  PERTINENT HISTORY:  Possible endometriosis  Sexual abuse: No  BOWEL MOVEMENT Pain with bowel movement: No Type of bowel movement:Type (Bristol Stool Scale) 4, Frequency 5x per week, and Strain No Fully empty rectum: No Leakage: No Pads: No Fiber supplement: No  URINATION Pain with urination: No Fully empty bladder: Yes:   Stream: Strong Urgency: No Frequency: every 2 hours Leakage:  none Pads: No  INTERCOURSE Pain with intercourse:  Initial Penetration, During Penetration, and After Intercourse Ability to have vaginal penetration:  Yes: but painful Climax: not painful but is limited in ability to climax due to pain Marinoff Scale: 3/3  PREGNANCY Vaginal deliveries 0 Tearing No C-section deliveries 0 Currently pregnant No  PROLAPSE None  OBJECTIVE:   DIAGNOSTIC FINDINGS:    COGNITION:  Overall cognitive status: Within functional limits for tasks assessed     SENSATION:  Light touch: Appears intact  Proprioception: Appears intact  MUSCLE LENGTH: Bil hamstrings and adductors limited by 25%  POSTURE:  Rounded shoulders, posterior pelvic tilt  LUMBARAROM/PROM  WFL but mild stiffness reported   LOWER EXTREMITY ROM:    WFL  LOWER EXTREMITY MMT:      Rt hip 4/5 and Lt hip 3+/5; bil knees and ankles 5/5  PELVIC MMT:      MMT eval  Vaginal   Internal Anal Sphincter   External Anal Sphincter   Puborectalis   Diastasis Recti   (Blank rows = not tested)        PALPATION:   General  TTP at Lt lower abdominal quadrant and Lt anterior pelvis, pt ticklish and hard to assess fascia at abdomen                 External Perineal Exam pt would like wait for internal after having internal with MD yesterday and feeling "inflamed"                              Internal Pelvic Floor deferred (eval)   10/21/2021: slightly increased tone at Rt side; see treatment for details.    TONE: Deferred  (eval)  PROLAPSE: Deferred (eval)  TODAY'S TREATMENT   11/10/21:  Pt consented to internal vaginal treatment and assessment this date, found to have trigger points at Lt pubococcygeus and Rt obturator internus. Pt had several trigger points at lt pubococcygeus all released and one at obturator. Pt tolerated well, reports she felt release each time and afterward in sitting reports she is more comfortable and has no tenderness at tailbone or abdomen.  Pt educated on pelvic wand use with model for example  10/21/2021: Pt consented to internal vaginal treatment and assessment this date, pt educated on female pelvic floor with model use for anatomy. Externally pt reported TTP to at Lt proximal adductor, anterior pelvic region but "not painful, just pressure". Pt consented to internal: TTP at Lt bulbocavernosus, pubococcygeus, iliococcygeus, and Lt obturator internus. Pt had trigger points at obturator,  pubococcygeus, iliococcygeus and all released with gentle overpressure. Pt reports she could feel the release and felt much less pressure and tenderness both internally and externally at Lt abdomen and pelvic area.  Post internal treatment pt agreeable to external lower abdominal soft tissue massage to decrease tension and improve mobility with less pain. Pt responded well to treatment and reported feeling much better at end of session.    PATIENT EDUCATION:  Education details: [email or web address] Person educated: Patient Education method: Explanation, Demonstration, Actor cues, Verbal cues, and Handouts Education comprehension: verbalized understanding and returned demonstration   HOME EXERCISE PROGRAM: [id number]  ASSESSMENT:  CLINICAL IMPRESSION: Patient reports greatly improved pain since internal last visit. Pt pleased with progress so far requesting internal again this session to see if this helps. Pt does continue to have trigger points internally but all released during session. Pt tolerated well  reported feeling better after session and could feel immediate relief. Pt also  educated on pelvic wand use as she stated she has ordered this but it has not come in yet. Pt would benefit from additional PT to further address deficits.     OBJECTIVE IMPAIRMENTS decreased coordination, decreased endurance, decreased strength, increased fascial restrictions, increased muscle spasms, impaired flexibility, improper body mechanics, postural dysfunction, and pain.   ACTIVITY LIMITATIONS  intercourse/all vaginal penetration  PARTICIPATION LIMITATIONS: interpersonal relationship  PERSONAL FACTORS Time since onset of injury/illness/exacerbation and 1 comorbidity: possible endometriosis   are also affecting patient's functional outcome.   REHAB POTENTIAL: Good  CLINICAL DECISION MAKING: Stable/uncomplicated  EVALUATION COMPLEXITY: Low   GOALS: Goals reviewed with patient? Yes  SHORT TERM GOALS: Target date: 11/08/2021  Pt to be I with HEP.  Baseline: Goal status: INITIAL  2.  Pt to be I with recall of voiding mechanics and breathing mechanics to improve ability to fully void bowels and promote improved pelvic floor relaxation.  Baseline:  Goal status: INITIAL  3.  Pt to report no more than 6/10 pelvic pain for improved QOL.  Baseline:  Goal status: INITIAL    LONG TERM GOALS: Target date: 01/11/2022   Pt to be I with advanced HEP.  Baseline:  Goal status: INITIAL  2.  Pt to demonstrate at least 5/5 bil hip strength for improved pelvic stability and functional squats without pain.  Baseline:  Goal status: INITIAL  3.  Pt to report tolerance to vaginal penetration at size of vaginal dilator 5 or equivalent for improved ability to tolerate pelvic assessments medically and for intercourse.  Baseline:  Goal status: INITIAL  4.  Pt to report no more than 3/10 pelvic pain for improved QOL.  Baseline:  Goal status: INITIAL    PLAN: PT FREQUENCY: 1x/week  PT DURATION:  8  sessions  PLANNED INTERVENTIONS: Therapeutic exercises, Therapeutic activity, Neuromuscular re-education, Patient/Family education, Joint mobilization, Aquatic Therapy, Dry Needling, Spinal mobilization, Cryotherapy, Moist heat, Manual lymph drainage, scar mobilization, Taping, Biofeedback, and Manual therapy  PLAN FOR NEXT SESSION: manual internally if pt consents and appropriate, manual abdomen/ stretching  Otelia Sergeant, PT, DPT 07/06/238:40 AM

## 2021-11-17 ENCOUNTER — Ambulatory Visit: Payer: Commercial Managed Care - PPO | Admitting: Physical Therapy

## 2021-11-17 DIAGNOSIS — R279 Unspecified lack of coordination: Secondary | ICD-10-CM

## 2021-11-17 DIAGNOSIS — R293 Abnormal posture: Secondary | ICD-10-CM

## 2021-11-17 DIAGNOSIS — M6281 Muscle weakness (generalized): Secondary | ICD-10-CM | POA: Diagnosis not present

## 2021-11-17 NOTE — Therapy (Signed)
OUTPATIENT PHYSICAL THERAPY FEMALE PELVIC TREATMENT   Patient Name: Monique Pruitt MRN: [id number] DOB:January 24, 1991, 31 y.o., female Today's Date: 11/17/2021   PT End of Session - 11/17/21 0759     Visit Number 4    Date for PT Re-Evaluation 01/11/22    Authorization Type [employer]    PT Start Time 0800    PT Stop Time 0844    PT Time Calculation (min) 44 min    Activity Tolerance Patient tolerated treatment well    Behavior During Therapy WFL for tasks assessed/performed             Past Medical History:  Diagnosis Date   Depression    Genital HSV    Migraine headache with aura    Remote; last experienced over 5 years ago   Past Surgical History:  Procedure Laterality Date   WISDOM TOOTH EXTRACTION     Patient Active Problem List   Diagnosis Date Noted   Painful menstrual periods 03/22/2021   Migraine with aura 03/15/2021   Acne 12/29/2008   Soft tissue lesion of shoulder region 02/17/2008    PCP: none per chart  REFERRING PROVIDER: Milas Hock, MD   REFERRING DIAG: N94.6 (ICD-10-CM) - Painful menstrual periods R10.2 (ICD-10-CM) - Pelvic pain  THERAPY DIAG:  Muscle weakness (generalized)  Unspecified lack of coordination  Abnormal posture  Rationale for Evaluation and Treatment Rehabilitation  ONSET DATE: one month  SUBJECTIVE:                                                                                                                                                                                           SUBJECTIVE STATEMENT: Pt reports she had just a little bit of pain at Lt side internally but this would also be around ovulation time and pt unsure if this could be pain from that. 2/10 pain at worst.   Fluid intake: Yes: 48 oz of water per day, sometimes tea    Patient confirms identification and approves PT to assess pelvic floor and treatment Yes   PAIN:  Are you having pain? No  PRECAUTIONS: None   PATIENT GOALS to have less  pain  PERTINENT HISTORY:  Possible endometriosis  Sexual abuse: No  BOWEL MOVEMENT Pain with bowel movement: No Type of bowel movement:Type (Bristol Stool Scale) 4, Frequency 5x per week, and Strain No Fully empty rectum: No Leakage: No Pads: No Fiber supplement: No  URINATION Pain with urination: No Fully empty bladder: Yes:   Stream: Strong Urgency: No Frequency: every 2 hours Leakage:  none Pads: No  INTERCOURSE Pain with intercourse: Initial Penetration, During Penetration, and After  Intercourse Ability to have vaginal penetration:  Yes: but painful Climax: not painful but is limited in ability to climax due to pain Marinoff Scale: 3/3  PREGNANCY Vaginal deliveries 0 Tearing No C-section deliveries 0 Currently pregnant No  PROLAPSE None  OBJECTIVE:   DIAGNOSTIC FINDINGS:    COGNITION:  Overall cognitive status: Within functional limits for tasks assessed     SENSATION:  Light touch: Appears intact  Proprioception: Appears intact  MUSCLE LENGTH: Bil hamstrings and adductors limited by 25%  POSTURE:  Rounded shoulders, posterior pelvic tilt  LUMBARAROM/PROM  WFL but mild stiffness reported   LOWER EXTREMITY ROM:    WFL  LOWER EXTREMITY MMT:      Rt hip 4/5 and Lt hip 3+/5; bil knees and ankles 5/5          PALPATION:   General  TTP at Lt lower abdominal quadrant and Lt anterior pelvis, pt ticklish and hard to assess fascia at abdomen                 External Perineal Exam pt would like wait for internal after having internal with MD yesterday and feeling "inflamed"                              Internal Pelvic Floor deferred (eval)   10/21/2021: slightly increased tone at Rt side; see treatment for details.    TONE: Deferred (eval)  PROLAPSE: Deferred (eval)  TODAY'S TREATMENT   11/17/21:  Pt consented to internal vaginal manual treatment today, found to have three minor trigger points (lt obturator, superficial pubococcygeus, and  one at rt superficial pubococcygeus.). pt tolerated well with release, reported only minor tenderness with palpation and had only minor tension felt internally overall.  Moist heat applied externally at Lt anterior pelvic region and hip with x5 cloth layers between skin and hot pack without adverse effects at end of session. Heat during stretching Stretching: hamstring 2x30s with strap, adductors 2x30s with strap, ITB with strap 2x30s all bil; bil piriformis stretch, hip flexor stretch in half kneeling x30s each   11/10/21:  Pt consented to internal vaginal treatment and assessment this date, found to have trigger points at Lt pubococcygeus and Rt obturator internus. Pt had several trigger points at lt pubococcygeus all released and one at obturator. Pt tolerated well, reports she felt release each time and afterward in sitting reports she is more comfortable and has no tenderness at tailbone or abdomen.  Pt educated on pelvic wand use with model for example  10/21/2021: Pt consented to internal vaginal treatment and assessment this date, pt educated on female pelvic floor with model use for anatomy. Externally pt reported TTP to at Lt proximal adductor, anterior pelvic region but "not painful, just pressure". Pt consented to internal: TTP at Lt bulbocavernosus, pubococcygeus, iliococcygeus, and Lt obturator internus. Pt had trigger points at obturator,  pubococcygeus, iliococcygeus and all released with gentle overpressure. Pt reports she could feel the release and felt much less pressure and tenderness both internally and externally at Lt abdomen and pelvic area.  Post internal treatment pt agreeable to external lower abdominal soft tissue massage to decrease tension and improve mobility with less pain. Pt responded well to treatment and reported feeling much better at end of session.    PATIENT EDUCATION:  Education details: [email or web address] Person educated: Patient Education method: Explanation,  Demonstration, Actor cues, Verbal cues, and Handouts Education comprehension: verbalized understanding and returned  demonstration   HOME EXERCISE PROGRAM: [email or web address]  ASSESSMENT:  CLINICAL IMPRESSION: Patient reports she has still felt improvement since eval, did have a little pain this past week but at most 2/10.  Pt does continue to have trigger points internally but all released during session and fewer with less tenderness compared to last session. Pt tolerated well reported feeling better after session and could feel immediate relief.  Pt session also had stretching and moist heat for improved muscle mobility and decreased pain and tension. Pt would benefit from additional PT to further address deficits.     OBJECTIVE IMPAIRMENTS decreased coordination, decreased endurance, decreased strength, increased fascial restrictions, increased muscle spasms, impaired flexibility, improper body mechanics, postural dysfunction, and pain.   ACTIVITY LIMITATIONS  intercourse/all vaginal penetration  PARTICIPATION LIMITATIONS: interpersonal relationship  PERSONAL FACTORS Time since onset of injury/illness/exacerbation and 1 comorbidity: possible endometriosis   are also affecting patient's functional outcome.   REHAB POTENTIAL: Good  CLINICAL DECISION MAKING: Stable/uncomplicated  EVALUATION COMPLEXITY: Low   GOALS: Goals reviewed with patient? Yes  SHORT TERM GOALS: Target date: 11/08/2021  Pt to be I with HEP.  Baseline: Goal status: INITIAL  2.  Pt to be I with recall of voiding mechanics and breathing mechanics to improve ability to fully void bowels and promote improved pelvic floor relaxation.  Baseline:  Goal status: INITIAL  3.  Pt to report no more than 6/10 pelvic pain for improved QOL.  Baseline:  Goal status: INITIAL    LONG TERM GOALS: Target date: 01/11/2022   Pt to be I with advanced HEP.  Baseline:  Goal status: INITIAL  2.  Pt to demonstrate at least 5/5 bil  hip strength for improved pelvic stability and functional squats without pain.  Baseline:  Goal status: INITIAL  3.  Pt to report tolerance to vaginal penetration at size of vaginal dilator 5 or equivalent for improved ability to tolerate pelvic assessments medically and for intercourse.  Baseline:  Goal status: INITIAL  4.  Pt to report no more than 3/10 pelvic pain for improved QOL.  Baseline:  Goal status: INITIAL    PLAN: PT FREQUENCY: 1x/week  PT DURATION:  8 sessions  PLANNED INTERVENTIONS: Therapeutic exercises, Therapeutic activity, Neuromuscular re-education, Patient/Family education, Joint mobilization, Aquatic Therapy, Dry Needling, Spinal mobilization, Cryotherapy, Moist heat, Manual lymph drainage, scar mobilization, Taping, Biofeedback, and Manual therapy  PLAN FOR NEXT SESSION: manual internally if pt consents and appropriate, manual abdomen/ stretching  Otelia Sergeant, PT, DPT 07/13/238:46 AM

## 2021-11-24 ENCOUNTER — Ambulatory Visit: Payer: Commercial Managed Care - PPO | Admitting: Physical Therapy

## 2021-11-24 DIAGNOSIS — R279 Unspecified lack of coordination: Secondary | ICD-10-CM

## 2021-11-24 DIAGNOSIS — M6281 Muscle weakness (generalized): Secondary | ICD-10-CM | POA: Diagnosis not present

## 2021-11-24 DIAGNOSIS — R293 Abnormal posture: Secondary | ICD-10-CM

## 2021-11-24 NOTE — Therapy (Signed)
OUTPATIENT PHYSICAL THERAPY FEMALE PELVIC TREATMENT   Patient Name: Monique Pruitt MRN: [id number] DOB:1990-11-11, 31 y.o., female Today's Date: 11/24/2021   PT End of Session - 11/24/21 0803     Visit Number 5    Date for PT Re-Evaluation 01/11/22    Authorization Type [employer]    PT Start Time 0801    PT Stop Time 0839    PT Time Calculation (min) 38 min    Activity Tolerance Patient tolerated treatment well    Behavior During Therapy WFL for tasks assessed/performed             Past Medical History:  Diagnosis Date   Depression    Genital HSV    Migraine headache with aura    Remote; last experienced over 5 years ago   Past Surgical History:  Procedure Laterality Date   WISDOM TOOTH EXTRACTION     Patient Active Problem List   Diagnosis Date Noted   Painful menstrual periods 03/22/2021   Migraine with aura 03/15/2021   Acne 12/29/2008   Soft tissue lesion of shoulder region 02/17/2008    PCP: none per chart  REFERRING PROVIDER: Radene Gunning, MD   REFERRING DIAG: N94.6 (ICD-10-CM) - Painful menstrual periods R10.2 (ICD-10-CM) - Pelvic pain  THERAPY DIAG:  Muscle weakness (generalized)  Unspecified lack of coordination  Abnormal posture  Rationale for Evaluation and Treatment Rehabilitation  ONSET DATE: one month  SUBJECTIVE:                                                                                                                                                                                           SUBJECTIVE STATEMENT: Pt reports she has had no pain this past week, thinks last week was minor pain from ovulation but no other issues. Pt would like to cancel next appointment to see how she does with/without pain through next period and then return after period.   Fluid intake: Yes: 48 oz of water per day, sometimes tea    Patient confirms identification and approves PT to assess pelvic floor and treatment Yes   PAIN:  Are you  having pain? No  PRECAUTIONS: None   PATIENT GOALS to have less pain  PERTINENT HISTORY:  Possible endometriosis  Sexual abuse: No  BOWEL MOVEMENT Pain with bowel movement: No Type of bowel movement:Type (Bristol Stool Scale) 4, Frequency 5x per week, and Strain No Fully empty rectum: No Leakage: No Pads: No Fiber supplement: No  URINATION Pain with urination: No Fully empty bladder: Yes:   Stream: Strong Urgency: No Frequency: every 2 hours Leakage:  none Pads: No  INTERCOURSE Pain  with intercourse: Initial Penetration, During Penetration, and After Intercourse Ability to have vaginal penetration:  Yes: but painful Climax: not painful but is limited in ability to climax due to pain Marinoff Scale: 3/3  PREGNANCY Vaginal deliveries 0 Tearing No C-section deliveries 0 Currently pregnant No  PROLAPSE None  OBJECTIVE:   DIAGNOSTIC FINDINGS:    COGNITION:  Overall cognitive status: Within functional limits for tasks assessed     SENSATION:  Light touch: Appears intact  Proprioception: Appears intact  MUSCLE LENGTH: Bil hamstrings and adductors limited by 25%  POSTURE:  Rounded shoulders, posterior pelvic tilt  LUMBARAROM/PROM  WFL but mild stiffness reported   LOWER EXTREMITY ROM:    WFL  LOWER EXTREMITY MMT:      Rt hip 4/5 and Lt hip 3+/5; bil knees and ankles 5/5          PALPATION:   General  TTP at Lt lower abdominal quadrant and Lt anterior pelvis, pt ticklish and hard to assess fascia at abdomen                 External Perineal Exam pt would like wait for internal after having internal with MD yesterday and feeling "inflamed"                              Internal Pelvic Floor deferred (eval)   10/21/2021: slightly increased tone at Rt side; see treatment for details.    TONE: Deferred (eval)  PROLAPSE: Deferred (eval)  TODAY'S TREATMENT   11/24/21: Bil hamstring, adductor, ITB stretching with strap 3x30s Happy baby 2x30s   Thoracic openers 2x30s each Opp hand/knee ball press 2x10 Bear hovers 2x10 Blue band palloffs 2x10 each One leg on bosu ball lunges x10 each  11/17/21:  Pt consented to internal vaginal manual treatment today, found to have three minor trigger points (lt obturator, superficial pubococcygeus, and one at rt superficial pubococcygeus.). pt tolerated well with release, reported only minor tenderness with palpation and had only minor tension felt internally overall.  Moist heat applied externally at Lt anterior pelvic region and hip with x5 cloth layers between skin and hot pack without adverse effects at end of session. Heat during stretching Stretching: hamstring 2x30s with strap, adductors 2x30s with strap, ITB with strap 2x30s all bil; bil piriformis stretch, hip flexor stretch in half kneeling x30s each   PATIENT EDUCATION:  Education details: [email or web address] Person educated: Patient Education method: Explanation, Demonstration, Tactile cues, Verbal cues, and Handouts Education comprehension: verbalized understanding and returned demonstration   HOME EXERCISE PROGRAM: [email or web address]  ASSESSMENT:  CLINICAL IMPRESSION: Patient reports she has started to feel much better, would like to see how she does through her next period then discuss discharging. Pt session focused on stretching for pelvic relaxation and preventing tension building internally to continue improved pain levels, and introducing core strengthening and hip strengthening for improved pelvic stability. Pt would benefit from additional PT to further address deficits.     OBJECTIVE IMPAIRMENTS decreased coordination, decreased endurance, decreased strength, increased fascial restrictions, increased muscle spasms, impaired flexibility, improper body mechanics, postural dysfunction, and pain.   ACTIVITY LIMITATIONS  intercourse/all vaginal penetration  PARTICIPATION LIMITATIONS: interpersonal relationship  PERSONAL FACTORS Time since  onset of injury/illness/exacerbation and 1 comorbidity: possible endometriosis   are also affecting patient's functional outcome.   REHAB POTENTIAL: Good  CLINICAL DECISION MAKING: Stable/uncomplicated  EVALUATION COMPLEXITY: Low   GOALS: Goals reviewed with patient? Yes  SHORT TERM  GOALS: Target date: 11/08/2021  Pt to be I with HEP.  Baseline: Goal status: MET  2.  Pt to be I with recall of voiding mechanics and breathing mechanics to improve ability to fully void bowels and promote improved pelvic floor relaxation.  Baseline:  Goal status: MET  3.  Pt to report no more than 6/10 pelvic pain for improved QOL.  Baseline:  Goal status: MET    LONG TERM GOALS: Target date: 01/11/2022   Pt to be I with advanced HEP.  Baseline:  Goal status: INITIAL  2.  Pt to demonstrate at least 5/5 bil hip strength for improved pelvic stability and functional squats without pain.  Baseline:  Goal status: On going  3.  Pt to report tolerance to vaginal penetration at size of vaginal dilator 5 or equivalent for improved ability to tolerate pelvic assessments medically and for intercourse.  Baseline:  Goal status: MET  4.  Pt to report no more than 3/10 pelvic pain for improved QOL.  Baseline:  Goal status: MET    PLAN: PT FREQUENCY: 1x/week  PT DURATION:  8 sessions  PLANNED INTERVENTIONS: Therapeutic exercises, Therapeutic activity, Neuromuscular re-education, Patient/Family education, Joint mobilization, Aquatic Therapy, Dry Needling, Spinal mobilization, Cryotherapy, Moist heat, Manual lymph drainage, scar mobilization, Taping, Biofeedback, and Manual therapy  PLAN FOR NEXT SESSION: manual internally if pt consents and appropriate, manual abdomen/ stretching  Stacy Gardner, PT, DPT 07/20/238:43 AM

## 2021-12-01 ENCOUNTER — Ambulatory Visit: Payer: Commercial Managed Care - PPO | Admitting: Physical Therapy

## 2021-12-08 ENCOUNTER — Ambulatory Visit: Payer: Commercial Managed Care - PPO | Attending: Obstetrics and Gynecology | Admitting: Physical Therapy

## 2021-12-08 DIAGNOSIS — R279 Unspecified lack of coordination: Secondary | ICD-10-CM | POA: Insufficient documentation

## 2021-12-08 DIAGNOSIS — M6281 Muscle weakness (generalized): Secondary | ICD-10-CM | POA: Diagnosis present

## 2021-12-08 NOTE — Therapy (Signed)
OUTPATIENT PHYSICAL THERAPY FEMALE PELVIC TREATMENT   Patient Name: Monique Pruitt MRN: [id number] DOB:1991/01/24, 31 y.o., female Today's Date: 12/08/2021   PT End of Session - 12/08/21 0759     Visit Number 6    Date for PT Re-Evaluation 01/18/22    Authorization Type [employer]    PT Start Time 0800    PT Stop Time 0838    PT Time Calculation (min) 38 min    Activity Tolerance Patient tolerated treatment well    Behavior During Therapy WFL for tasks assessed/performed             Past Medical History:  Diagnosis Date   Depression    Genital HSV    Migraine headache with aura    Remote; last experienced over 5 years ago   Past Surgical History:  Procedure Laterality Date   WISDOM TOOTH EXTRACTION     Patient Active Problem List   Diagnosis Date Noted   Painful menstrual periods 03/22/2021   Migraine with aura 03/15/2021   Acne 12/29/2008   Soft tissue lesion of shoulder region 02/17/2008    PCP: none per chart  REFERRING PROVIDER: Radene Gunning, MD   REFERRING DIAG: N94.6 (ICD-10-CM) - Painful menstrual periods R10.2 (ICD-10-CM) - Pelvic pain  THERAPY DIAG:  Muscle weakness (generalized)  Unspecified lack of coordination  Rationale for Evaluation and Treatment Rehabilitation  ONSET DATE: one month  SUBJECTIVE:                                                                                                                                                                                           SUBJECTIVE STATEMENT: Pt reports she continues to have no pain when not on period. When on her period last week had pain in abdomen, back and this has remained the same.    Fluid intake: Yes: 48 oz of water per day, sometimes tea    Patient confirms identification and approves PT to assess pelvic floor and treatment Yes   PAIN:  Are you having pain? No  PRECAUTIONS: None   PATIENT GOALS to have less pain  PERTINENT HISTORY:  Possible endometriosis   Sexual abuse: No  BOWEL MOVEMENT Pain with bowel movement: No Type of bowel movement:Type (Bristol Stool Scale) 4, Frequency 5x per week, and Strain No Fully empty rectum: No Leakage: No Pads: No Fiber supplement: No  URINATION Pain with urination: No Fully empty bladder: Yes:   Stream: Strong Urgency: No Frequency: every 2 hours Leakage:  none Pads: No  INTERCOURSE Pain with intercourse: Initial Penetration, During Penetration, and After Intercourse Ability to have vaginal penetration:  Yes: but  painful Climax: not painful but is limited in ability to climax due to pain Marinoff Scale: 3/3  PREGNANCY Vaginal deliveries 0 Tearing No C-section deliveries 0 Currently pregnant No  PROLAPSE None  OBJECTIVE:   DIAGNOSTIC FINDINGS:    COGNITION:  Overall cognitive status: Within functional limits for tasks assessed     SENSATION:  Light touch: Appears intact  Proprioception: Appears intact  MUSCLE LENGTH: Bil hamstrings and adductors limited by 25%  POSTURE:  Rounded shoulders, posterior pelvic tilt  LUMBARAROM/PROM  WFL but mild stiffness reported   LOWER EXTREMITY ROM:    WFL  LOWER EXTREMITY MMT:      Rt hip 4/5 and Lt hip 3+/5; bil knees and ankles 5/5          PALPATION:   General  TTP at Lt lower abdominal quadrant and Lt anterior pelvis, pt ticklish and hard to assess fascia at abdomen                 External Perineal Exam pt would like wait for internal after having internal with MD yesterday and feeling "inflamed"                              Internal Pelvic Floor deferred (eval)   10/21/2021: slightly increased tone at Rt side; see treatment for details.    TONE: Deferred (eval)  PROLAPSE: Deferred (eval)  TODAY'S TREATMENT   12/08/2021: Manual: abdominal fascial release throughout all quadrants, with moderate tension laterally and profound tightness throughout midline of abdomen. Indirect progressing to direct release throughout  with good release felt in all quadrants. X10 diaphragmatic breathing after and pt reports "I didn't realize how much tension I had there. This feels so much better." 2x30s childs pose, 2x30 thoracic openers each side, 2x30s needle threaders in quad.   11/24/21: Bil hamstring, adductor, ITB stretching with strap 3x30s Happy baby 2x30s  Thoracic openers 2x30s each Opp hand/knee ball press 2x10 Bear hovers 2x10 Blue band palloffs 2x10 each One leg on bosu ball lunges x10 each    PATIENT EDUCATION:  Education details: [email or web address] Person educated: Patient Education method: Consulting civil engineer, Demonstration, Corporate treasurer cues, Verbal cues, and Handouts Education comprehension: verbalized understanding and returned demonstration   HOME EXERCISE PROGRAM: [email or web address]  ASSESSMENT:  CLINICAL IMPRESSION: Patient reports she is no longer having any pain unless on period. Period pain is similar to prior to PT. Session focused on abdominal release in all quadrants and pt educated while doing this on how to complete some abdominal massage at home during and a few days prior to period to see if this helps her pain levels. Pt next session moved to after next period to see if this helps her symptoms prior to DC. Pt would benefit from additional PT to further address deficits.     OBJECTIVE IMPAIRMENTS decreased coordination, decreased endurance, decreased strength, increased fascial restrictions, increased muscle spasms, impaired flexibility, improper body mechanics, postural dysfunction, and pain.   ACTIVITY LIMITATIONS  intercourse/all vaginal penetration  PARTICIPATION LIMITATIONS: interpersonal relationship  PERSONAL FACTORS Time since onset of injury/illness/exacerbation and 1 comorbidity: possible endometriosis   are also affecting patient's functional outcome.   REHAB POTENTIAL: Good  CLINICAL DECISION MAKING: Stable/uncomplicated  EVALUATION COMPLEXITY: Low   GOALS: Goals reviewed with patient?  Yes  SHORT TERM GOALS: Target date: 11/08/2021  Pt to be I with HEP.  Baseline: Goal status: MET  2.  Pt to be I with recall of voiding  mechanics and breathing mechanics to improve ability to fully void bowels and promote improved pelvic floor relaxation.  Baseline:  Goal status: MET  3.  Pt to report no more than 6/10 pelvic pain for improved QOL.  Baseline:  Goal status: MET    LONG TERM GOALS: Target date: 01/11/2022   Pt to be I with advanced HEP.  Baseline:  Goal status: INITIAL  2.  Pt to demonstrate at least 5/5 bil hip strength for improved pelvic stability and functional squats without pain.  Baseline:  Goal status: On going  3.  Pt to report tolerance to vaginal penetration at size of vaginal dilator 5 or equivalent for improved ability to tolerate pelvic assessments medically and for intercourse.  Baseline:  Goal status: MET  4.  Pt to report no more than 3/10 pelvic pain for improved QOL.  Baseline:  Goal status: MET    PLAN: PT FREQUENCY: 1x/week  PT DURATION:  8 sessions  PLANNED INTERVENTIONS: Therapeutic exercises, Therapeutic activity, Neuromuscular re-education, Patient/Family education, Joint mobilization, Aquatic Therapy, Dry Needling, Spinal mobilization, Cryotherapy, Moist heat, Manual lymph drainage, scar mobilization, Taping, Biofeedback, and Manual therapy  PLAN FOR NEXT SESSION: manual internally if pt consents and appropriate, manual abdomen/ stretching  Stacy Gardner, PT, DPT 08/03/238:46 AM

## 2021-12-15 ENCOUNTER — Ambulatory Visit: Payer: Commercial Managed Care - PPO | Admitting: Physical Therapy

## 2022-01-12 ENCOUNTER — Encounter: Payer: Self-pay | Admitting: Physical Therapy

## 2022-01-12 ENCOUNTER — Ambulatory Visit: Payer: Commercial Managed Care - PPO | Attending: Obstetrics and Gynecology | Admitting: Physical Therapy

## 2022-01-12 DIAGNOSIS — M6281 Muscle weakness (generalized): Secondary | ICD-10-CM | POA: Diagnosis present

## 2022-01-12 DIAGNOSIS — R279 Unspecified lack of coordination: Secondary | ICD-10-CM | POA: Insufficient documentation

## 2022-01-12 DIAGNOSIS — R293 Abnormal posture: Secondary | ICD-10-CM | POA: Insufficient documentation

## 2022-01-12 NOTE — Therapy (Signed)
OUTPATIENT PHYSICAL THERAPY FEMALE PELVIC TREATMENT   Patient Name: Monique Pruitt MRN: [id number] DOB:10/26/1990, 31 y.o., female Today's Date: 01/12/2022   PT End of Session - 01/12/22 0845     Visit Number 7    Date for PT Re-Evaluation 01/18/22    Authorization Type [employer]    PT Start Time 0845    PT Stop Time 0905   pt denied additional needs for PT, pleased with progress and needed to get to another appointment and ended treatment early.   PT Time Calculation (min) 20 min    Activity Tolerance Patient tolerated treatment well    Behavior During Therapy WFL for tasks assessed/performed             Past Medical History:  Diagnosis Date   Depression    Genital HSV    Migraine headache with aura    Remote; last experienced over 5 years ago   Past Surgical History:  Procedure Laterality Date   WISDOM TOOTH EXTRACTION     Patient Active Problem List   Diagnosis Date Noted   Painful menstrual periods 03/22/2021   Migraine with aura 03/15/2021   Acne 12/29/2008   Soft tissue lesion of shoulder region 02/17/2008    PCP: none per chart  REFERRING PROVIDER: Milas Hock, MD   REFERRING DIAG: N94.6 (ICD-10-CM) - Painful menstrual periods R10.2 (ICD-10-CM) - Pelvic pain  THERAPY DIAG:  Unspecified lack of coordination  Abnormal posture  Muscle weakness (generalized)  Rationale for Evaluation and Treatment Rehabilitation  ONSET DATE: one month  SUBJECTIVE:                                                                                                                                                                                           SUBJECTIVE STATEMENT: Pt reports she is no longer having any pain outside of period and intercourse is no longer painful at all. Periods are still as high as 9/10 for short time during days 2-3 but other than this 0-2/10 pain.   Fluid intake: Yes: 48 oz of water per day, sometimes tea    Patient confirms  identification and approves PT to assess pelvic floor and treatment Yes   PAIN:  Are you having pain? No  PRECAUTIONS: None   PATIENT GOALS to have less pain  PERTINENT HISTORY:  Possible endometriosis  Sexual abuse: No  BOWEL MOVEMENT Pain with bowel movement: No Type of bowel movement:Type (Bristol Stool Scale) 4, Frequency 5x per week, and Strain No Fully empty rectum: No Leakage: No Pads: No Fiber supplement: No  URINATION Pain with urination: No Fully empty bladder: Yes:  Stream: Strong Urgency: No Frequency: every 2 hours Leakage:  none Pads: No  INTERCOURSE Pain with intercourse: Initial Penetration, During Penetration, and After Intercourse Ability to have vaginal penetration:  Yes: but painful Climax: not painful but is limited in ability to climax due to pain Marinoff Scale: 3/3  PREGNANCY Vaginal deliveries 0 Tearing No C-section deliveries 0 Currently pregnant No  PROLAPSE None  OBJECTIVE:   DIAGNOSTIC FINDINGS:    COGNITION:  Overall cognitive status: Within functional limits for tasks assessed     SENSATION:  Light touch: Appears intact  Proprioception: Appears intact  MUSCLE LENGTH: Bil hamstrings and adductors limited by 25%  POSTURE:  Rounded shoulders, posterior pelvic tilt  LUMBARAROM/PROM  WFL but mild stiffness reported   LOWER EXTREMITY ROM:    WFL  LOWER EXTREMITY MMT:      Rt hip 4/5 and Lt hip 3+/5; bil knees and ankles 5/5         PALPATION:   General  TTP at Lt lower abdominal quadrant and Lt anterior pelvis, pt ticklish and hard to assess fascia at abdomen                 External Perineal Exam pt would like wait for internal after having internal with MD yesterday and feeling "inflamed"                              Internal Pelvic Floor deferred (eval)   10/21/2021: slightly increased tone at Rt side; see treatment for details.    TONE: Deferred (eval)  PROLAPSE: Deferred (eval)  TODAY'S TREATMENT    01/12/22: Pt reporting she is no longer having pain outside of period at all and no longer having pain with sex, only having pain with 2 days during period (days 2-3) with pain high and day 1, days 4-7 0-2/10 pain. Pt very pleased with progress and reports she is ready for DC. Pt had questions about maintaining gains and educated to continue HEP, HEP updated for more abdominal mobility and to prevent tension returns with addition of posture exercises as pt reports she feels tight with sitting a lot for work. Pt also educated on continued fitness for relaxation, flexibility and strengthening as well. Pt denied additional questions and denied additional needs for PT.      PATIENT EDUCATION:  Education details: [id number] Person educated: Patient Education method: Consulting civil engineer, Demonstration, Corporate treasurer cues, Verbal cues, and Handouts Education comprehension: verbalized understanding and returned demonstration   HOME EXERCISE PROGRAM: [id number]  ASSESSMENT:  CLINICAL IMPRESSION: Patient pleased with progress, only having period pains that are high pain levels 2 days during periods, rest of period pretty low pain. Pt reports she feels confident in DC post treatment today. Pt session focused on education of updated HEP, continued HEP, maintaining strength, flexibility and relaxation techniques and recommendations and pt agreed. Reports she goes to the [facility] and understands to continue to attempt abdominal mobility to decrease tension returns. Pt understands she will need new referral for future PT needs, all goals met and pt denied additional needs for PT and ended session early to go to another appointment, pleased with progress.    OBJECTIVE IMPAIRMENTS decreased coordination, decreased endurance, decreased strength, increased fascial restrictions, increased muscle spasms, impaired flexibility, improper body mechanics, postural dysfunction, and pain.   ACTIVITY LIMITATIONS  intercourse/all vaginal  penetration  PARTICIPATION LIMITATIONS: interpersonal relationship  PERSONAL FACTORS Time since onset of injury/illness/exacerbation and 1 comorbidity: possible endometriosis  are also affecting patient's functional outcome.   REHAB POTENTIAL: Good  CLINICAL DECISION MAKING: Stable/uncomplicated  EVALUATION COMPLEXITY: Low   GOALS: Goals reviewed with patient? Yes  SHORT TERM GOALS: Target date: 11/08/2021  Pt to be I with HEP.  Baseline: Goal status: MET  2.  Pt to be I with recall of voiding mechanics and breathing mechanics to improve ability to fully void bowels and promote improved pelvic floor relaxation.  Baseline:  Goal status: MET  3.  Pt to report no more than 6/10 pelvic pain for improved QOL.  Baseline:  Goal status: MET    LONG TERM GOALS: Target date: 01/11/2022   Pt to be I with advanced HEP.  Baseline:  Goal status: MET  2.  Pt to demonstrate at least 5/5 bil hip strength for improved pelvic stability and functional squats without pain.  Baseline:  Goal status: MET  3.  Pt to report tolerance to vaginal penetration at size of vaginal dilator 5 or equivalent for improved ability to tolerate pelvic assessments medically and for intercourse.  Baseline:  Goal status: MET  4.  Pt to report no more than 3/10 pelvic pain for improved QOL.  Baseline:  Goal status: MET    PLAN: PT FREQUENCY: 1x/week  PT DURATION:  8 sessions  PLANNED INTERVENTIONS: Therapeutic exercises, Therapeutic activity, Neuromuscular re-education, Patient/Family education, Joint mobilization, Aquatic Therapy, Dry Needling, Spinal mobilization, Cryotherapy, Moist heat, Manual lymph drainage, scar mobilization, Taping, Biofeedback, and Manual therapy  PLAN FOR NEXT SESSION:   PHYSICAL THERAPY DISCHARGE SUMMARY  Visits from Start of Care: 7  Current functional level related to goals / functional outcomes: All goals met   Remaining deficits: Pt does still have 2 days during  periods where pain is high but all other pain and pt goals met   Education / Equipment: HEP   Patient agrees to discharge. Patient goals were met. Patient is being discharged due to meeting the stated rehab goals.   Stacy Gardner, PT, DPT 09/07/239:11 AM

## 2022-05-11 ENCOUNTER — Encounter: Payer: Self-pay | Admitting: Obstetrics and Gynecology

## 2022-05-25 ENCOUNTER — Encounter: Payer: Self-pay | Admitting: *Deleted

## 2022-05-25 DIAGNOSIS — Z3490 Encounter for supervision of normal pregnancy, unspecified, unspecified trimester: Secondary | ICD-10-CM | POA: Insufficient documentation

## 2022-05-25 DIAGNOSIS — Z3480 Encounter for supervision of other normal pregnancy, unspecified trimester: Secondary | ICD-10-CM | POA: Insufficient documentation

## 2022-05-29 ENCOUNTER — Encounter: Payer: Self-pay | Admitting: Obstetrics and Gynecology

## 2022-05-29 ENCOUNTER — Ambulatory Visit (INDEPENDENT_AMBULATORY_CARE_PROVIDER_SITE_OTHER): Payer: Commercial Managed Care - PPO

## 2022-05-29 ENCOUNTER — Ambulatory Visit (INDEPENDENT_AMBULATORY_CARE_PROVIDER_SITE_OTHER): Payer: Commercial Managed Care - PPO | Admitting: Obstetrics and Gynecology

## 2022-05-29 ENCOUNTER — Other Ambulatory Visit (HOSPITAL_COMMUNITY)
Admission: RE | Admit: 2022-05-29 | Discharge: 2022-05-29 | Disposition: A | Discharge status: Home / Self Care | Payer: Commercial Managed Care - PPO | Source: Ambulatory Visit | Attending: Obstetrics and Gynecology | Admitting: Obstetrics and Gynecology

## 2022-05-29 VITALS — BP 118/77 | HR 116 | Wt 152.0 lb

## 2022-05-29 DIAGNOSIS — Z3401 Encounter for supervision of normal first pregnancy, first trimester: Secondary | ICD-10-CM

## 2022-05-29 DIAGNOSIS — Z8619 Personal history of other infectious and parasitic diseases: Secondary | ICD-10-CM | POA: Diagnosis not present

## 2022-05-29 DIAGNOSIS — Z3490 Encounter for supervision of normal pregnancy, unspecified, unspecified trimester: Secondary | ICD-10-CM | POA: Diagnosis not present

## 2022-05-29 DIAGNOSIS — Z3491 Encounter for supervision of normal pregnancy, unspecified, first trimester: Secondary | ICD-10-CM

## 2022-05-29 DIAGNOSIS — Z3A10 10 weeks gestation of pregnancy: Secondary | ICD-10-CM | POA: Diagnosis not present

## 2022-05-29 NOTE — Patient Instructions (Signed)
AREA PEDIATRIC/FAMILY PRACTICE PHYSICIANS  Central/Southeast [place] ([address]) [facility] Chambliss, MD; Eniola, MD; Hale, MD; Hensel, MD; McDiarmid, MD; McIntyer, MD; Neal, MD; Walden, MD [address] [place], [place] [address] [phone] Mon-Fri 8:30-12:30, 1:30-5:00 Providers come to see babies at [facility] Accepting Medicaid [facility] providers who accept newborns: Koirala, MD; Morrow, MD; Wolters, MD [address], [place], [place] [address] [phone] Mon-Fri 8:00-5:30 Babies seen by providers at [facility] Does NOT accept Medicaid Please call early in hospitalization for appointment (limited availability)  [facility] Mulberry, MD [address] [place], [place] [address] [phone] Mon, Tue, Thur, Fri 8:30-5:00, Wed 10:00-7:00 (closed 1-2pm) Babies seen by [facility] providers Accepting Medicaid Rubin - Pediatrician Rubin, MD [address], [place], [place] [address] [phone] Mon-Fri 8:30-5:00, Sat 8:30-12:00 Provider comes to see babies at [facility] Accepting Medicaid Must have been referred from current patients or contacted office prior to delivery [facility] ([facility]) Brown, MD; Chandler, MD; Ettefagh, MD; Grant, MD; Lester, MD; McCormick, MD; McQueen, MD; Prose, MD; Simha, MD; Stanley, MD; Stryffeler, NP; Tebben, NP [address], [place], [place] [address] [phone] Mon, Tue, Thur, Fri 8:30-5:30, Wed 9:30-5:30, Sat 8:30-12:30 Babies seen by [facility] providers Accepting Medicaid Only accepting infants of first-time parents or siblings of current patients Hospital discharge coordinator will make follow-up appointment Jack Amos [address], [place], [place]  [address] [phone]   Fax - [phone] [facility] [address], [place], [place]  [address] Phone - [phone]   Fax - [phone] Shilpa Gosrani [address] [place], [place]  [address] [phone]  East/Northeast [place] ([address]) [facility] Bates, MD; Brassfield, MD; Cooper, Cox, MD; MD; Davis, MD; Dovico, MD; Ettefaugh, MD; Little, MD; Lowe, MD; Keiffer, MD; Melvin, MD; Sumner, MD; Williams, MD [address], [place], [place] [address] [phone] Mon-Fri 8:30-5:00 (extended evenings Mon-Thur as needed), Sat-Sun 10:00-1:00 Providers come to see babies at [facility] Accepting Medicaid for families of first-time babies and families with all children in the household age 3 and under. Must register with office prior to making appointment (M-F only). [facility] Henson, NP; Knapp, MD; Lalonde, MD; Tysinger, PA [address] [place], [place] [address] [phone] Mon-Fri 8:00-5:00 Babies seen by providers at [facility] Does NOT accept Medicaid/Commercial Insurance Only [facility] ([facility])  Artis, MD; Barnes, MD; Bratton, MD; Coccaro, MD; Lockett Gardner, MD; Kramer, MD; Marshall, MD; Netherton, MD; Poleto, MD; Skinner, MD [address] [place], [place] [address] [phone] Mon-Fri 8:30-5:30, Sat (Oct.-Mar.) 9:00-1:00 Babies seen by providers at [facility] Accepting Medicaid  [place] ([address]) [facility] Reid, MD; Warner, MD [address], [place], [place] [address] [phone] Mon-Fri 8:30-5:00, Sat 8:30-12:00 Providers come to see babies at [facility] Does NOT accept Medicaid [facility] Becker, PA; Hagler, MD; Scifres, PA; Sun, MD; Swayne, MD [address], [place], [place] [address] [phone] Mon-Fri 8:00-5:00 Babies seen by providers at [facility] Does NOT accept Medicaid Only accepting babies of parents who  are patients Please call early in hospitalization for appointment (limited availability) [facility] Clark, MD; Frye, MD; Kelleher, MD; Mack, NP; Miller, MD; O'Keller, MD; Patterson, NP; Pudlo, MD; Puzio, MD; Thomas, MD; Tucker, MD; Twiselton, MD [address], [place], [place] [address] [phone] Mon-Fri 8:00-5:00, Sat 9:00-12:00 Providers come to see babies at [facility] Does NOT accept Medicaid  [place] ([address]) [facility] providers accepting new patients: Brake, NP; Wharton, PA [address], [place], [place] [address] [phone] Mon-Fri 8:00-5:00 Babies seen by providers at [facility] Does NOT accept Medicaid Only accepting babies of parents who are patients Please call early in hospitalization for appointment (limited availability) [facility] Gay, MD; Quinlan, MD [address] [place], [place] [address] [phone] (press [phone] to schedule appointment) Mon-Fri 8:00-5:00 Providers come to see babies at [facility] Does NOT accept Medicaid [facility] Mazer, MD [address] [place], [place] [address] [phone] Mon-Fri 8:30-5:00 (lunch 12:30-1:00), extended hours by appointment only Wed 5:00-6:30 Babies seen by [facility] providers Accepting Medicaid [facility] Banks, MD; Jordan, MD; Koberlein, MD [address], [place], [place] [address] [phone] Mon-Fri 8:00-5:00 Babies seen by [facility] providers Does NOT accept Medicaid [facility] Parker, MD; Hunter, MD; Wallace, DO [address] [place], [place] [address] [phone] Mon-Fri 8:00-5:00 Babies seen by [facility] providers Does NOT accept Medicaid [facility] Brandon, PA; Brecken, PA; Christy, NP; Dees, MD; DeClaire, MD; DeWeese, MD; Hansen, NP; Mills, NP; Parrish, NP; Smoot, NP; Summer, MD; Vapne,  MD [address] [place], [place] [address] [phone] Mon-Fri 8:30-5:00, Sat 10:00-1:00 Providers come to see babies at [facility] Does NOT accept Medicaid Free prenatal information session Tuesdays at 4:45pm [facility] Bouska, MD; Gordon, PA; Jeffery, PA; Weber, PA [address] [place] [address] [phone] Mon-Fri 7:30-5:30 Babies seen by [facility] providers [facility] [address], [place], [place]  [address] [phone]   Fax - [phone]  [place] ([address] & [address]) [facility] [place] [address] [place], [place] [address] [phone] Mon-Thur 8:00-6:00 Providers come to see babies at [facility] Accepting Medicaid [facility] Anderson, NP; Badger, MD; Beal, PA; Spencer, PA [address] [place], [place] [address] [phone] Mon-Thur 7:30-7:30, Fri 7:30-4:30 Babies seen by [facility] providers Accepting Medicaid [facility] Agbuya, MD; Klett, NP; Romgoolam, MD [address], [place], [place] [address] [phone] Mon-Fri 8:30-5:00, Sat 8:30-12:00 Providers come to see babies at [facility] Accepting Medicaid Must have "Meet & Greet" appointment at office prior to delivery [facility] ([facility] McCord, MD; Wallace, MD; Wood, MD [address], [place], [place] [address] [phone] Mon-Wed 8:00-6:00, Thur-Fri 8:00-5:00, Sat 9:00-12:00 Providers come to see babies at [facility] Does NOT accept Medicaid Only accepting siblings of current patients [facility]  [address], [place], [place]  [address] [phone]   Fax - [phone] [facility] [address], [place], [place]  [address] [phone]   Fax -  [phone]  [place]west  ([address] & [address]) [facility] Cirigliano, DO; Matthews, DO [address] , [place] [address] [phone] Mon-Fri 7:00-5:00 Babies seen by [facility] providers Does NOT accept Medicaid [facility] Briscoe, MD; Howley, PA; Moreira, PA [address], [place], [place] [address] [phone] Mon-Fri 8:00-5:00 Babies seen by [facility] providers Accepting Medicaid [facility] Boyd, MD; Church, PA; Jones, NP; Osborn, PA [address], ,  [address] [phone] Mon-Fri 8:00-5:00 Babies seen by providers at [facility] Accepting Medicaid  [place] ([address]) [facility] [place] Wendling, DO [address] [place], [place] [address] [phone] Mon-Fri 8:00-5:00 Babies seen by [facility] providers Does NOT accept Medicaid Limited availability, please call early in hospitalization to schedule follow-up [facility] Calderon, PA; Cummings, MD; Dillard, MD; Martin, PA; Olson, MD; VanDeven, PA [address], [place], [place] [address] [phone] Mon-Fri 8:30-5:00, Sat 9:00-12:00 Babies seen by providers at [facility] Accepting Medicaid Please register online then schedule online or call office [email or web address] Hunter, NP; Kumar, MD; [place] Rogers, PA [address], [place], [place] [address] [phone] Mon-Fri 8:00-5:00 Babies seen by providers at [facility] Accepting Medicaid [facility] ([facility]) Selma, MD; Kristi Fleenor, NP; West, MD [address], [place], [place] [address] [phone] Mon-Fri 8:00-5:30, Sat&Sun by appointment (phones open at  8:30) Babies seen by [facility] providers Accepting Medicaid Must be a first-time baby or sibling of current patient [facility] [place]  [address], [place], [place]  [address] [phone]   Fax - [phone]  [place] ([address]) [facility] Brown, PA; Cowen, PA; Rice, MD; Helton, PA; Spry, MD [address] [place], [place] [address] [phone] Mon-Thur 8:00-7:00, Fri 8:00-5:00, Sat 8:00-12:00, Sun 9:00-12:00 Babies seen by [facility] providers Accepting Medicaid [facility] Coe-Goins, MD; Marshall, MD; Pierre-Louis, MD [address], [place], [place] [address] [phone] Mon-Thur 8:00-5:00 Babies seen by providers at [facility] Accepting Medicaid [facility] Bratton, MD; Coe-Goins, MD; Hayes, MD; Lewis, MD; List, MD; Lott, MD; Marshall, MD; Moran, MD; O'Neal, MD; Pierre-Louis, MD; Pitonzo, MD; Scholer, MD; Spangle, MD [address] [place], [place] [address] [phone] Mon-Fri 8:00-5:30, Sat (Oct.-Mar.) 9:00-1:00 Babies seen by providers at [facility] Accepting Medicaid Must fill out new patient packet, available online at [email or web address] [facility] ([facility]) Friddle, NP; Harris, NP; Kelly, NP; Logan, MD; Melvin, PA; Poth, MD; Ramadoss, MD; Stanton, NP [address], [place], [place] [address] [phone] Mon-Thur 8:00-5:30, Fri 8:00-5:00 Babies seen by providers at [facility] Accepting Medicaid  [place] ([address]) [facility] Dixon, PA; Emanuel, MD; Pickard, MD; Tapia, PA [address], [place], [place] [address] [phone] Mon-Fri 8:00-5:00 Babies seen by providers at [facility] Accepting Medicaid   [place] ([address]) [facility] [place] Masneri, DO; Meyers, MD; Nelson, PA [address], [place], [place] [address] [phone] Mon-Fri 8:00-5:00 Babies seen by providers at [facility] Does NOT accept Medicaid Limited appointment availability, please call early in hospitalization  [facility] [place] Kunedd, DO; McGowen, MD [address], [place], [place] [address] [phone] Mon-Fri 8:00-5:00 Babies seen by [facility] providers Does NOT accept Medicaid [facility] [place] Cameron, MD; MacDonald, MD; Michaels, PA; Nayak, MD [address] [place] [address], [place], [place] [address] [phone] Mon-Fri 8:00-5:00 After hours clinic ([address] [place],  [address]) [phone] Mon-Fri 5:00-8:00, Sat 12:00-6:00, Sun 10:00-4:00 Babies seen by [facility] providers Accepting Medicaid [facility] [place] [address], [place], [place]  [address] [phone]   Fax - [phone]  [place] ([address]) [facility] [place] [facility] Andy, MD [address], [place], [place] [address] [phone] Mon-Fri 8:00-5:00 Babies seen by [facility] providers Does NOT accept Medicaid [facility] [place] ([facility] [place]) Eksir, MD [address], [place], [place] [address] [phone] Mon-Thur 8:00-7:00, Fri 8:00-5:00, Sat 8:00-12:00 Babies seen by providers at [facility] Accepting Medicaid - but does not have vaccinations in office (must be received elsewhere) Limited availability, please call early in hospitalization  [place] ([address]) [facility]  Charlene Flemming, MD [address], [place] [address] [phone]  Fax [phone]  [facility]  Kimberly Newton, MD, Annamarie Streilein, PA, Carla Hampton, PA [address] [place] [place], [place]  [address] [phone] [facility]  [address], [place], [place] [address] [phone] [address], [place], [place] [address] [phone] (West Office)  [facility] [address], [place], [place] [address] [phone] [facility] [address] [place], [place], [place] [address] [phone] [facility] [address], [place], [place] [address] [phone] [facility] [address], [place], [place] [address] [phone] [facility] [address], [place], [place] [address] [phone] [facility] [address], [place], [place] [address] [phone] [facility]  [address], [place], [place] [address] [phone] Dr[facility] [address] [place], [place], [place] [address] [phone] [facility] [address], [place], [place] [address] [phone] [facility] [address], [place], [place] [address] [phone]  Safe Medications in Pregnancy   Acne:  Benzoyl Peroxide  Salicylic Acid   Backache/Headache:  Tylenol: 2 regular strength every 4 hours OR               2 Extra strength every 6 hours   Colds/Coughs/Allergies:  Benadryl (alcohol free) 25 mg every 6 hours as needed  Breath right strips  Claritin  Cepacol throat lozenges  Chloraseptic throat spray  Cold-Eeze- up to three times per day  Cough drops, alcohol free  Flonase (by prescription only)  Guaifenesin  Mucinex  Robitussin DM (plain only, alcohol free)  Saline nasal spray/drops  Sudafed (pseudoephedrine) & Actifed * use only after [redacted] weeks gestation and if you do not have high blood pressure  Tylenol  Vicks Vaporub  Zinc lozenges  Zyrtec   Constipation:  Colace  Ducolax suppositories  Fleet enema  Glycerin suppositories  Metamucil  Milk of magnesia  Miralax  Senokot  Smooth move tea   Diarrhea:  Kaopectate  Imodium A-D    *NO pepto Bismol   Hemorrhoids:  Anusol  Anusol HC  Preparation H  Tucks   Indigestion:  Tums  Maalox  Mylanta  Zantac  Pepcid   Insomnia:  Benadryl (alcohol free) 25mg every 6 hours as needed  Tylenol PM  Unisom, no Gelcaps   Leg Cramps:  Tums  MagGel   Nausea/Vomiting:  Bonine  Dramamine  Emetrol  Ginger extract  Sea bands  Meclizine  Nausea medication to take during pregnancy:  Unisom (doxylamine succinate 25 mg tablets) Take one tablet daily at bedtime. If symptoms are not adequately controlled, the dose can be increased to a maximum recommended dose of two tablets daily (1/2 tablet in the morning, 1/2 tablet mid-afternoon and one at bedtime).  Vitamin B6 100mg tablets. Take one tablet twice a day (up   to 200 mg per day).   Skin Rashes:  Aveeno products  Benadryl cream or 25mg every 6 hours as needed  Calamine Lotion  1% cortisone cream   Yeast infection:  Gyne-lotrimin 7  Monistat 7    **If taking multiple medications, please check labels to avoid duplicating the same active ingredients  **take medication as directed on the label  ** Do not exceed 4000 mg of tylenol in 24 hours  **Do not take medications that contain aspirin or ibuprofen           

## 2022-05-29 NOTE — Progress Notes (Signed)
     Subjective:   Monique Pruitt is a 32 y.o. G1P0000 at [redacted]w[redacted]d by LMP c/w [place] today being seen for her first obstetrical visit.  Her obstetrical history is significant for genital HSV, migraines and depression. Patient does intend to breast feed. Pregnancy history fully reviewed.  Patient reports no complaints.  HISTORY: OB History  Gravida Para Term Preterm AB Living  1 0 0 0 0 0  SAB IAB Ectopic Multiple Live Births  0 0 0 0 0    # Outcome Date GA Lbr Len/2nd Weight Sex Delivery Anes PTL Lv  1 Current             Last pap smear: Lab Results  Component Value Date   DIAGPAP  10/10/2021    - Negative for intraepithelial lesion or malignancy (NILM)   Past Medical History:  Diagnosis Date   Depression    Genital HSV    Migraine without aura 03/15/2021   Past Surgical History:  Procedure Laterality Date   WISDOM TOOTH EXTRACTION     Family History  Problem Relation Age of Onset   Endometriosis Mother    Ovarian cancer Maternal Grandmother    Social History   Tobacco Use   Smoking status: Never   Smokeless tobacco: Never  Vaping Use   Vaping Use: Never used  Substance Use Topics   Alcohol use: Not Currently    Comment: occ   Drug use: Never   No Known Allergies Current Outpatient Medications on File Prior to Visit  Medication Sig Dispense Refill   Prenatal Vit-Fe Fumarate-FA (PRENATAL VITAMIN PO) Take by mouth.     No current facility-administered medications on file prior to visit.   Exam   Vitals:   05/29/22 0817  BP: 118/77  Pulse: (!) 116  Weight: 152 lb (68.9 kg)      General:  Alert, oriented and cooperative. Patient is in no acute distress.  Breast: deferred  Cardiovascular: Normal heart rate noted  Respiratory: Normal respiratory effort, no problems with respiration noted  Abdomen: Soft, nontender  Pelvic: Deferred  Extremities: Normal range of motion.     Mental Status: Normal mood and affect. Normal behavior. Normal judgment and  thought content.    Assessment:   Pregnancy: G1P0000 Patient Active Problem List   Diagnosis Date Noted   History of HSV 05/29/2022   Encounter for supervision of normal pregnancy 05/25/2022   Plan:  1. Encounter for supervision of normal pregnancy, antepartum, unspecified gravidity Initial labs drawn. Continue prenatal vitamins. Genetic Screening discussed: NIPS, carrier screening and AFP, declined. Ultrasound discussed; fetal anatomic survey: ordered. Problem list reviewed and updated. The nature of [place] with multiple MDs and other Advanced Practice Providers was explained to patient; also emphasized that residents, students are part of our team. Routine obstetric precautions reviewed. Return in about 4 weeks (around 06/26/2022) for return OB at 14-16 weeks.  2. Hx HSV No outbreak in > 2 years Discussed suppression by 36w Discussed CS if outbreak at time of delivery  Gale Journey, MD [place], Product/process development scientist for [place]

## 2022-05-30 LAB — CERVICOVAGINAL ANCILLARY ONLY
Chlamydia: NEGATIVE
Comment: NEGATIVE
Comment: NORMAL
Neisseria Gonorrhea: NEGATIVE

## 2022-05-31 ENCOUNTER — Encounter: Payer: Self-pay | Admitting: Obstetrics and Gynecology

## 2022-05-31 DIAGNOSIS — Z6791 Unspecified blood type, Rh negative: Secondary | ICD-10-CM | POA: Insufficient documentation

## 2022-05-31 DIAGNOSIS — O26899 Other specified pregnancy related conditions, unspecified trimester: Secondary | ICD-10-CM | POA: Insufficient documentation

## 2022-05-31 LAB — PREGNANCY, INITIAL SCREEN
Antibody Screen: NEGATIVE
Basophils Absolute: 0 10*3/uL (ref 0.0–0.2)
Basos: 0 %
Bilirubin, UA: NEGATIVE
Chlamydia trachomatis, NAA: NEGATIVE
EOS (ABSOLUTE): 0 10*3/uL (ref 0.0–0.4)
Eos: 0 %
Glucose, UA: NEGATIVE
HCV Ab: NONREACTIVE
HIV Screen 4th Generation wRfx: NONREACTIVE
Hematocrit: 36.1 % (ref 34.0–46.6)
Hemoglobin: 12.4 g/dL (ref 11.1–15.9)
Hepatitis B Surface Ag: NEGATIVE
Immature Grans (Abs): 0 10*3/uL (ref 0.0–0.1)
Immature Granulocytes: 0 %
Ketones, UA: NEGATIVE
Leukocytes,UA: NEGATIVE
Lymphocytes Absolute: 0.9 10*3/uL (ref 0.7–3.1)
Lymphs: 11 %
MCH: 29.5 pg (ref 26.6–33.0)
MCHC: 34.3 g/dL (ref 31.5–35.7)
MCV: 86 fL (ref 79–97)
Monocytes Absolute: 0.4 10*3/uL (ref 0.1–0.9)
Monocytes: 5 %
Neisseria Gonorrhoeae by PCR: NEGATIVE
Neutrophils Absolute: 6.8 10*3/uL (ref 1.4–7.0)
Neutrophils: 84 %
Nitrite, UA: NEGATIVE
Platelets: 179 10*3/uL (ref 150–450)
Protein,UA: NEGATIVE
RBC, UA: NEGATIVE
RBC: 4.2 x10E6/uL (ref 3.77–5.28)
RDW: 12.2 % (ref 11.7–15.4)
RPR Ser Ql: NONREACTIVE
Rh Factor: NEGATIVE
Rubella Antibodies, IGG: 4.34 index (ref >0.99)
Specific Gravity, UA: 1.007 (ref 1.005–1.030)
Urobilinogen, Ur: 0.2 mg/dL (ref 0.2–1.0)
WBC: 8.1 10*3/uL (ref 3.4–10.8)
pH, UA: 7 (ref 5.0–7.5)

## 2022-05-31 LAB — HEPATITIS C ANTIBODY: Hep C Virus Ab: NONREACTIVE

## 2022-05-31 LAB — MICROSCOPIC EXAMINATION
Bacteria, UA: NONE SEEN
Casts: NONE SEEN /lpf
Epithelial Cells (non renal): NONE SEEN /hpf (ref 0–10)
RBC, Urine: NONE SEEN /hpf (ref 0–2)
WBC, UA: NONE SEEN /hpf (ref 0–5)

## 2022-05-31 LAB — CULTURE, OB URINE

## 2022-05-31 LAB — HCV INTERPRETATION

## 2022-05-31 LAB — URINE CULTURE, OB REFLEX
Organism ID, Bacteria: NO GROWTH
Organism ID, Bacteria: NO GROWTH

## 2022-06-02 ENCOUNTER — Encounter: Payer: Self-pay | Admitting: Obstetrics and Gynecology

## 2022-06-26 ENCOUNTER — Encounter: Payer: Self-pay | Admitting: Obstetrics & Gynecology

## 2022-06-26 ENCOUNTER — Ambulatory Visit (INDEPENDENT_AMBULATORY_CARE_PROVIDER_SITE_OTHER): Payer: Commercial Managed Care - PPO | Admitting: Obstetrics & Gynecology

## 2022-06-26 VITALS — BP 113/70 | HR 83 | Wt 151.0 lb

## 2022-06-26 DIAGNOSIS — Z3490 Encounter for supervision of normal pregnancy, unspecified, unspecified trimester: Secondary | ICD-10-CM

## 2022-06-26 DIAGNOSIS — Z3A14 14 weeks gestation of pregnancy: Secondary | ICD-10-CM

## 2022-06-26 DIAGNOSIS — Z3402 Encounter for supervision of normal first pregnancy, second trimester: Secondary | ICD-10-CM

## 2022-06-26 MED ORDER — LIDOCAINE-PRILOCAINE 2.5-2.5 % EX CREA: 1.0000 | TOPICAL_CREAM | CUTANEOUS | 0 refills | Status: DC | PRN

## 2022-06-26 NOTE — Progress Notes (Signed)
   PRENATAL VISIT NOTE  Subjective:  Monique Pruitt is a 32 y.o. G1P0000 at 54w5dbeing seen today for ongoing prenatal care.  She is currently monitored for the following issues for this low-risk pregnancy and has Encounter for supervision of normal pregnancy; History of HSV; and Rh negative state in antepartum period on their problem list.  Patient reports no complaints.  Contractions: Not present. Vag. Bleeding: None.  Movement: Absent. Denies leaking of fluid.   The following portions of the patient's history were reviewed and updated as appropriate: allergies, current medications, past family history, past medical history, past social history, past surgical history and problem list.   Objective:   Vitals:   06/26/22 1010  BP: 113/70  Pulse: 83  Weight: 151 lb (68.5 kg)    Fetal Status: Fetal Heart Rate (bpm): 146   Movement: Absent     General:  Alert, oriented and cooperative. Patient is in no acute distress.  Skin: Skin is warm and dry. No rash noted.   Cardiovascular: Normal heart rate noted  Respiratory: Normal respiratory effort, no problems with respiration noted  Abdomen: Soft, gravid, appropriate for gestational age.  Pain/Pressure: Absent     Pelvic: Cervical exam deferred        Extremities: Normal range of motion.  Edema: None  Mental Status: Normal mood and affect. Normal behavior. Normal judgment and thought content.   Assessment and Plan:  Pregnancy: G1P0000 at 170w5d. Encounter for supervision of normal pregnancy, antepartum, unspecified gravidity Reviewed labs and Rh negative status  CaTyleenderstands to come in with vaginal bleeding and abdominal Pruitt / deceleration  Anatomy USKoreardered and scheduled Baby Rx optimized schedule with BPs crossing into Epic  2.  Syncopal Episode with last blood draw EMLA cream prescribed for 28 week labs Pt should ask for butterfly needle.   Preterm labor symptoms and general obstetric precautions including but  not limited to vaginal bleeding, contractions, leaking of fluid and fetal movement were reviewed in detail with the patient. Please refer to After Visit Summary for other counseling recommendations.   No follow-ups on file.  Future Appointments  Date Time Provider De[place]en3/18/2024  9:00 AM [facility] US[facility]3/18/2024 11:10 AM LeGala RomneyKeFredderick PhenixMD [facility]  KeSilas SacramentoMD

## 2022-07-19 ENCOUNTER — Telehealth: Payer: Self-pay

## 2022-07-24 ENCOUNTER — Ambulatory Visit (INDEPENDENT_AMBULATORY_CARE_PROVIDER_SITE_OTHER): Payer: Commercial Managed Care - PPO | Admitting: Obstetrics & Gynecology

## 2022-07-24 ENCOUNTER — Other Ambulatory Visit: Payer: Self-pay | Admitting: *Deleted

## 2022-07-24 ENCOUNTER — Ambulatory Visit: Payer: Commercial Managed Care - PPO | Attending: Obstetrics and Gynecology

## 2022-07-24 ENCOUNTER — Ambulatory Visit: Payer: Commercial Managed Care - PPO | Attending: Obstetrics and Gynecology | Admitting: Maternal & Fetal Medicine

## 2022-07-24 ENCOUNTER — Other Ambulatory Visit: Payer: Self-pay | Admitting: Obstetrics and Gynecology

## 2022-07-24 VITALS — BP 116/78 | HR 90 | Wt 153.0 lb

## 2022-07-24 DIAGNOSIS — Z3490 Encounter for supervision of normal pregnancy, unspecified, unspecified trimester: Secondary | ICD-10-CM | POA: Diagnosis not present

## 2022-07-24 DIAGNOSIS — Z3A18 18 weeks gestation of pregnancy: Secondary | ICD-10-CM

## 2022-07-24 DIAGNOSIS — O99352 Diseases of the nervous system complicating pregnancy, second trimester: Secondary | ICD-10-CM | POA: Diagnosis not present

## 2022-07-24 DIAGNOSIS — Z8619 Personal history of other infectious and parasitic diseases: Secondary | ICD-10-CM

## 2022-07-24 DIAGNOSIS — Z362 Encounter for other antenatal screening follow-up: Secondary | ICD-10-CM | POA: Diagnosis not present

## 2022-07-24 DIAGNOSIS — O3503X0 Maternal care for (suspected) central nervous system malformation or damage in fetus, choroid plexus cysts, not applicable or unspecified: Secondary | ICD-10-CM | POA: Diagnosis not present

## 2022-07-24 DIAGNOSIS — Z3A19 19 weeks gestation of pregnancy: Secondary | ICD-10-CM | POA: Diagnosis not present

## 2022-07-24 DIAGNOSIS — Z363 Encounter for antenatal screening for malformations: Secondary | ICD-10-CM | POA: Diagnosis not present

## 2022-07-24 DIAGNOSIS — Z3402 Encounter for supervision of normal first pregnancy, second trimester: Secondary | ICD-10-CM

## 2022-07-24 NOTE — Progress Notes (Signed)
   PRENATAL VISIT NOTE  Subjective:  Monique Pruitt is a 32 y.o. G1P0000 at [redacted]w[redacted]d being seen today for ongoing prenatal care.  She is currently monitored for the following issues for this low-risk pregnancy and has Encounter for supervision of normal pregnancy; History of HSV; and Rh negative state in antepartum period on their problem list.  Patient reports  colostrum from breasts .  Contractions: Not present. Vag. Bleeding: None.  Movement: Present. Denies leaking of fluid.   The following portions of the patient's history were reviewed and updated as appropriate: allergies, current medications, past family history, past medical history, past social history, past surgical history and problem list.   Objective:   Vitals:   07/24/22 1111  BP: 116/78  Pulse: 90  Weight: 69.4 kg    Fetal Status: Fetal Heart Rate (bpm): 147   Movement: Present     General:  Alert, oriented and cooperative. Patient is in no acute distress.  Skin: Skin is warm and dry. No rash noted.   Cardiovascular: Normal heart rate noted  Respiratory: Normal respiratory effort, no problems with respiration noted  Abdomen: Soft, gravid, appropriate for gestational age.  Pain/Pressure: Absent     Pelvic: Cervical exam deferred        Extremities: Normal range of motion.  Edema: None  Mental Status: Normal mood and affect. Normal behavior. Normal judgment and thought content.   Assessment and Plan:  Pregnancy: G1P0000 at [redacted]w[redacted]d  1. Encounter for supervision of normal pregnancy, antepartum, unspecified gravidity Baby Rx opt scheudle--BPs crossing and normal.  Will get her back on schedule.  2. Choroid plexus Cyst Patient had anatomy scan today and the report is not ready.  However, MFM came to speak with her and told her about the choroid plexus cysts.  She declines genetic testing at this point as there were no other findings on the anatomy ultrasound.  Anatomy was not fully visualized and she has a follow-up  appointment.  3.  Tailbone Pain Patient sees chiropractor.  I suggested using a doughnut if it hurts to sit for long periods.  4.  Rh neg No bleeding   Preterm labor symptoms and general obstetric precautions including but not limited to vaginal bleeding, contractions, leaking of fluid and fetal movement were reviewed in detail with the patient. Please refer to After Visit Summary for other counseling recommendations.   No follow-ups on file.  Future Appointments  Date Time Provider [place]  09/04/2022  8:10 AM Inez Catalina, MD [facility]  09/04/2022  9:45 AM [facility] NURSE [facility]  09/04/2022 10:00 AM [facility] US1 [facility]  09/25/2022  8:10 AM Israella Hubert, Fredderick Phenix, MD [facility]    Silas Sacramento, MD

## 2022-07-24 NOTE — Progress Notes (Signed)
Patient information  Patient Name: Monique Pruitt  Patient MRN:   [id number]  Referring practice: [facility] Referring Provider: [facility]  Medical/Obstetric History   Past pregnancies OB History  Gravida Para Term Preterm AB Living  1 0 0 0 0 0  SAB IAB Ectopic Multiple Live Births  0 0 0 0 0    # Outcome Date GA Lbr Len/2nd Weight Sex Delivery Anes PTL Lv  1 Current              Monique Pruitt is a 32 y.o. G1P0000 at [redacted]w[redacted]d here for ultrasound and consultation.   RE RE CPC: I discussed the finding of bilateral small choroid plexus cysts were noted in the fetal brain but appear to be resolving.  The implications and management of choroid plexus cysts were discussed today.  I discussed that these are fluid-filled structures in the choroid plexus.  I discussed that in the setting of normal aneuploidy screening these represent a normal variant.  However, these historically have been associated with trisomy 45.  Up to 1 to 2% of all genetically normal fetuses will have a choroid plexus cyst.  However, up to 30% to 50% of all fetuses with trisomy 18 will have choroid plexus cyst.  Based on the most recent data, isolated choroid plexus cysts have a minimal increased risk of trisomy 18.  I discussed genetic screening versus diagnostic testing with amniocentesis versus expectant management.  There is no evidence of other ultrasound stigmata typically seen with trisomy 18 such as: congenital heart disease, clenched hands, single umbilical artery, intrauterine growth restriction, and rocker-bottom feet, however, very structures were limited based on poor echogenic windows.  The patient was notified that not all parts of the fetus were well-visualized.  The patient was comfortable with the ultrasound results and declined all future screening or testing since it would not change her anxiety level or the decision to continue the pregnancy.  Review of Systems: A review of  systems was performed and was negative except per HPI   Vitals and Physical Exam    07/24/2022   11:11 AM 06/26/2022   10:10 AM 05/29/2022    8:17 AM  Vitals with BMI  Weight 153 lbs 151 lbs 0000000 lbs  Systolic [id number]  Diastolic 78 70 77  Pulse 90 83 116   Sitting comfortably on the sonogram table Nonlabored breathing Normal rate and rhythm Abdomen is nontender  Sonographic findings Single intrauterine pregnancy. Fetal cardiac activity:  Observed and appears normal. Presentation: variable. The anatomic structures that were well seen appear normal except for very small CPCs. Due to poor acoustic windows, the visualization some structures remain suboptimally seen. Fetal biometry shows the estimated fetal weight is consistent with the EDD. Amniotic fluid volume: Within normal limits. MVP: 4.45 cm. Placenta: posterior.   Assessment Cord plexus cyst, small and bilateral 19-week gestation Plan -Detailed ultrasound was done today showing isolated bilateral choroid plexus cysts. -Follow-up anatomy in 4 to 6 weeks to complete any anatomy not visualized today. -Genetic screening and diagnostic testing were offered but declined  I spent 30 minutes reviewing the patients chart, including labs and images as well as counseling the patient about her medical conditions. Greater than 50% of the time was spent in direct face-to-face patient counseling.  Valeda Malm  MFM, [place]   07/24/2022  12:38 PM   Future Appointments   Future Appointments  Date Time Provider [place]  09/04/2022  9:45  AM [facility] NURSE [facility]  09/04/2022 10:00 AM [facility] US1 [facility]  09/18/2022  8:10 AM Leg[place], Fredderick Phenix, MD [facility]

## 2022-07-25 ENCOUNTER — Encounter: Payer: Self-pay | Admitting: Obstetrics and Gynecology

## 2022-07-25 DIAGNOSIS — G930 Cerebral cysts: Secondary | ICD-10-CM | POA: Insufficient documentation

## 2022-07-26 ENCOUNTER — Other Ambulatory Visit: Payer: Self-pay | Admitting: Obstetrics & Gynecology

## 2022-07-26 ENCOUNTER — Encounter: Payer: Self-pay | Admitting: Obstetrics & Gynecology

## 2022-07-26 MED ORDER — LIDOCAINE-PRILOCAINE 2.5-2.5 % EX CREA: 1.0000 | TOPICAL_CREAM | CUTANEOUS | 0 refills | Status: DC | PRN

## 2022-07-31 ENCOUNTER — Other Ambulatory Visit: Payer: Self-pay | Admitting: *Deleted

## 2022-07-31 DIAGNOSIS — Z3402 Encounter for supervision of normal first pregnancy, second trimester: Secondary | ICD-10-CM

## 2022-08-17 LAB — PANORAMA PRENATAL TEST FULL PANEL:PANORAMA TEST PLUS 5 ADDITIONAL MICRODELETIONS: FETAL FRACTION: 8.8

## 2022-08-30 ENCOUNTER — Other Ambulatory Visit (HOSPITAL_COMMUNITY)
Admission: RE | Admit: 2022-08-30 | Discharge: 2022-08-30 | Disposition: A | Discharge status: Home / Self Care | Payer: Commercial Managed Care - PPO | Source: Ambulatory Visit | Attending: Obstetrics and Gynecology | Admitting: Obstetrics and Gynecology

## 2022-08-30 ENCOUNTER — Ambulatory Visit (INDEPENDENT_AMBULATORY_CARE_PROVIDER_SITE_OTHER): Payer: Commercial Managed Care - PPO | Admitting: Obstetrics and Gynecology

## 2022-08-30 ENCOUNTER — Encounter: Payer: Self-pay | Admitting: Obstetrics and Gynecology

## 2022-08-30 VITALS — BP 108/66 | HR 93 | Wt 163.0 lb

## 2022-08-30 DIAGNOSIS — O26899 Other specified pregnancy related conditions, unspecified trimester: Secondary | ICD-10-CM | POA: Insufficient documentation

## 2022-08-30 DIAGNOSIS — R102 Pelvic and perineal pain: Secondary | ICD-10-CM | POA: Diagnosis present

## 2022-08-30 DIAGNOSIS — Z3490 Encounter for supervision of normal pregnancy, unspecified, unspecified trimester: Secondary | ICD-10-CM

## 2022-08-30 DIAGNOSIS — Z3A24 24 weeks gestation of pregnancy: Secondary | ICD-10-CM

## 2022-08-30 DIAGNOSIS — O99891 Other specified diseases and conditions complicating pregnancy: Secondary | ICD-10-CM

## 2022-08-30 DIAGNOSIS — Z6791 Unspecified blood type, Rh negative: Secondary | ICD-10-CM

## 2022-08-30 DIAGNOSIS — M549 Dorsalgia, unspecified: Secondary | ICD-10-CM

## 2022-08-30 LAB — POCT URINALYSIS DIPSTICK
Bilirubin, UA: NEGATIVE
Blood, UA: NEGATIVE
Glucose, UA: NEGATIVE
Ketones, UA: NEGATIVE
Leukocytes, UA: NEGATIVE
Nitrite, UA: NEGATIVE
Protein, UA: NEGATIVE
Spec Grav, UA: 1.015 (ref 1.010–1.025)
Urobilinogen, UA: NEGATIVE E.U./dL — AB
pH, UA: 7 (ref 5.0–8.0)

## 2022-08-30 NOTE — Patient Instructions (Signed)
You can try - tylenol extra strength 1-2 tabs every 8 hours - icy hot or tiger balm creams - maternity support belt - physical therapy

## 2022-08-30 NOTE — Progress Notes (Signed)
   PRENATAL VISIT NOTE  Subjective:  Monique Pruitt is a 32 y.o. G1P0000 at [redacted]w[redacted]d being seen today as a problem visit for persistent back and pelvic pain.  Patient reports backache and pelvic pain x 1 month. Pain is progressively worsening. Has known history of back/pelvic pain for which she has followed with PT and a chiropractor in the past. Has done myofascial release through pelvic floor PT and she has been trying to use a wand to do it, but it doesn't seem to help. Pain is worse when going from lying to sitting up, twisting and when sitting for long periods of time.   Contractions: Not present. Vag. Bleeding: None.  Movement: Present. Denies leaking of fluid.   The following portions of the patient's history were reviewed and updated as appropriate: allergies, current medications, past family history, past medical history, past social history, past surgical history and problem list.   Objective:   Vitals:   08/30/22 1031  BP: 108/66  Pulse: 93  Weight: 163 lb (73.9 kg)   Fetal Status: Fetal Heart Rate (bpm): 145   Movement: Present     General:  Alert, oriented and cooperative. Patient is in no acute distress.  Skin: Skin is warm and dry. No rash noted.   Cardiovascular: Normal heart rate noted  Respiratory: Normal respiratory effort, no problems with respiration noted  Abdomen: Soft, gravid, appropriate for gestational age.  Pain/Pressure: Present     Discussed option for pelvic exam. Given the chronicity and description of her symptoms, this appears to be an MSK problem and we are concerned we will exacerbate pain with exam today. Reviewed that we will trial PT & OTC options but that if she continues to have pain I strongly recommend exam.  Assessment and Plan:  Pregnancy: G1P0000 at [redacted]w[redacted]d presenting as a problem visit for back pain  1. Back pain affecting pregnancy in second trimester 2. Pelvic pain affecting pregnancy, antepartum Will send letter to patient  requesting work accomodation - needs to drive 1.6+ hours each way to [place] twice per week for a job that can be completed fully remote Discussed options for pain management (see AVS) - Ambulatory referral to Physical Therapy - Culture, OB Urine - POCT Urinalysis Dipstick - Cervicovaginal ancillary only( [place])  3. Rh negative Discussed adding Rh to NIPS, but she would need repeat blood draw after discussion w/ natera. MyChart message sent  Preterm labor symptoms and general obstetric precautions including but not limited to vaginal bleeding, contractions, leaking of fluid and fetal movement were reviewed in detail with the patient.  Future Appointments  Date Time Provider Department Center  08/31/2022  8:00 AM Gearlean Alf, PT [facility] None  09/04/2022  9:45 AM [facility] NURSE [facility]  09/04/2022 10:00 AM [facility] US[facility]  09/18/2022  8:10 AM Penne Lash, Fredrich Romans, MD [facility]  10/09/2022  8:10 AM Jolayne Panther, Gigi Gin, MD [facility]  10/30/2022  8:10 AM Lennart Pall, MD [facility]   Lennart Pall, MD

## 2022-08-30 NOTE — Therapy (Signed)
OUTPATIENT PHYSICAL THERAPY THORACOLUMBAR EVALUATION   Patient Name: Monique Pruitt MRN: [id number] DOB:Dec 06, 1990, 32 y.o., female Today's Date: 08/31/2022  END OF SESSION:  PT End of Session - 08/31/22 0808     Visit Number 1    Date for PT Re-Evaluation 10/26/22    Authorization Type [employer]    PT Start Time 0808    PT Stop Time 0848    PT Time Calculation (min) 40 min    Activity Tolerance Patient tolerated treatment well    Behavior During Therapy [facility] for tasks assessed/performed             Past Medical History:  Diagnosis Date   Depression    Genital HSV    Migraine without aura 03/15/2021   Past Surgical History:  Procedure Laterality Date   WISDOM TOOTH EXTRACTION     Patient Active Problem List   Diagnosis Date Noted   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   Rh negative state in antepartum period 05/31/2022   History of HSV 05/29/2022   Encounter for supervision of normal pregnancy 05/25/2022    PCP: Pcp, No     REFERRING PROVIDER: Lennart Pall, MD   REFERRING DIAG: O99.891,M54.9 (ICD-10-CM) - Back pain affecting pregnancy in second trimester   Rationale for Evaluation and Treatment: Rehabilitation  THERAPY DIAG:  Back pain affecting pregnancy in second trimester  Pelvic pain affecting pregnancy in second trimester, antepartum  ONSET DATE: pelvic pain March 1, back pain mid January 2023  SUBJECTIVE:                                                                                                                                                                                           SUBJECTIVE STATEMENT:  Back hurts with prolonged sitting, standing or walking. Limited to 1 hour sitting, 10 min standing and walking 20-30 min. Pelvic pain on left side or rolling on left pain to back, getting up from sitting or bed or low seated car. Walking also hurts pelvis.  PERTINENT HISTORY:  H/O LBP and pelvic pain, left knee pain, 24 weeks and 1 day  pregnant EDD: 12/20/22, depression  PAIN:  Are you having pain? Yes: NPRS scale: 3 at rest up to 10/10 Pain location: tailbone Pain description: pinching, shooting pains out and down Aggravating factors: prolonged positioning Relieving factors: changing position  Are you having pain? Yes: NPRS scale: 7 now up to 10/10 Pain location: left ant pelvis Pain description: constant, bruise that's being pressed on Aggravating factors: on left side or rolling on left pain to back, getting up from sitting or bed or low seated car. Walking Relieving  factors: swimming  PRECAUTIONS: Other: pregnancy  WEIGHT BEARING RESTRICTIONS: No  FALLS:  Has patient fallen in last 6 months? 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  PLOF: Independent  PATIENT GOALS: to keep pain from getting worse  NEXT MD VISIT: mid May  OBJECTIVE:   DIAGNOSTIC FINDINGS:  N/A  SCREENING FOR RED FLAGS: Neg  COGNITION: Overall cognitive status: Within functional limits for tasks assessed     SENSATION: WFL  MUSCLE LENGTH: HS: mild tightness bil Piriformis:WNL  POSTURE: No Significant postural limitations  PALPATION: TTP in bil gluteals, lumbar  LUMBAR ROM: WNL, some tightness with left rotation 25% deficit  LOWER EXTREMITY ROM:   WNL  LOWER EXTREMITY MMT:  *pelvic pain unless noted otherwise  MMT Right eval Left eval  Hip flexion 4+ with pain right LB 5  Hip extension 5 5  Hip abduction 5* 4*r  Hip adduction 5 4+  Hip internal rotation    Hip external rotation    Knee flexion 5 4+  Knee extension 5 4+*  Ankle dorsiflexion 5 5   (Blank rows = not tested)  LUMBAR SPECIAL TESTS:  FABER test: Negative, negative SIJ sompression/distraction  FUNCTIONAL TESTS:  5 times sit to stand: 23.88 sec pelvic/low pain at  6/10  MODIFIED OSWESTRY: TBD   TODAY'S TREATMENT:                                                                                                                              DATE:   07/02/22 See pt ed and HEP   PATIENT EDUCATION:  Education details: PT eval findings, anticipated POC, need for further assessment of pelvic pain, initial HEP, postural awareness, education on log rolling for bed mobility, and discussed acquiring a belly band for support.  Person educated: Patient Education method: Explanation, Demonstration, Verbal cues, and Handouts Education comprehension: verbalized understanding and returned demonstration  HOME EXERCISE PROGRAM: Access Code: [email or web address] URL: [email or web address] Date: 08/31/2022 Prepared by: Monique Pruitt  Exercises - Seated Transversus Abdominis Bracing  - 2 x daily - 7 x weekly - 2 sets - 10 reps - Seated Pelvic Tilt  - 2 x daily - 7 x weekly - 2 sets - 10 reps - Cat Cow  - 2 x daily - 7 x weekly - 2 sets - 10 reps - 3 hold - Standing Lumbar Extension  - 2 x daily - 7 x weekly - 2 sets - 10 reps - Reclined Diaphragmatic Breathing  - 1 x daily - 7 x weekly - 1 sets - 20 reps - 1 2 second hold  ASSESSMENT:  CLINICAL IMPRESSION: Monique Pruitt is a 32 y.o. female who was seen today for physical therapy evaluation and treatment for low back pain affecting pregnanc which began in mid January and is  worse with prolonged sitting, standing and walking. Monique Pruitt also reports left sided pelvic pain which began at the beginning of March and is worse with standing, sit to stand transfers, bed mobility and getting in/out of the car. She also reports pain with walking. [name] demonstrates good standing posture at this time and is not overly lordotic. She has mild limitations in left lumbar rotation in standing. She demonstrates left-sided weakness in her lumbar stabilizers and left psoas. She reports relief from pain when she was in the pool  this week and would likely benefit from aquatic therapy. Plan to treat pt in Ortho setting and if pain continues seek referral to Pelvic Floor PT. She will benefit from skilled PT to address these deficits.    OBJECTIVE IMPAIRMENTS: decreased activity tolerance, decreased ROM, decreased strength, increased muscle spasms, impaired flexibility, and pain.   ACTIVITY LIMITATIONS: sitting, standing, transfers, and locomotion level  PARTICIPATION LIMITATIONS: occupation  PERSONAL FACTORS: Past/current experiences and 1-2 comorbidities: previous LB/pelvic pain and left knee pain  are also affecting patient's functional outcome.   REHAB POTENTIAL: Excellent  CLINICAL DECISION MAKING: Stable/uncomplicated  EVALUATION COMPLEXITY: Low   GOALS: Goals reviewed with patient? Yes  SHORT TERM GOALS: Target date: 09/14/2022  The patient will demonstrate knowledge of basic self care strategies and exercises to promote healing including log-rolling to get in/out of bed   Baseline: Goal status: INITIAL  2.  The patient will report a 30% improvement in pain levels with functional activities which are currently difficult including sit to stand, turning over in bed, getting in/out of car and walking. Baseline:  Goal status: INITIAL  3.  Patient to complete Modified Oswestry by 2nd land visit for baseline functional assessment. Baseline:  Goal status: INITIAL    LONG TERM GOALS: Target date: 10/26/2022  The patient will be independent in a safe self progression of a home exercise program to promote further recovery of function   Baseline:  Goal status: INITIAL  2. The patient will report a 75% improvement in pain levels with functional activities which are currently difficult including bed mobility, sit to stand, getting in/out of car and walking Baseline:  Goal status: INITIAL  3.  The patient will have improved hip flexor and extensor muscle strength to 5/5 needed for lifting medium weight  objects and carrying her infant  Baseline:  Goal status: INITIAL  4.  Improved modified Oswestry score by 12% indicating improved function with less pain Baseline: TBD Goal status: INITIAL   PLAN:  PT FREQUENCY: 2x/week  PT DURATION: 8 weeks  PLANNED INTERVENTIONS: Therapeutic exercises, Therapeutic activity, Neuromuscular re-education, Patient/Family education, Self Care, Joint mobilization, Aquatic Therapy, Spinal mobilization, Cryotherapy, Moist heat, Taping, and Manual therapy.  PLAN FOR NEXT SESSION: Complete Modified Oswestry by 2nd land visit, Progress and review HEP for hip/knee/core/hip flexor strengthening,    Diedra Sinor, PT 08/31/2022, 9:17 AM

## 2022-08-31 ENCOUNTER — Other Ambulatory Visit: Payer: Self-pay

## 2022-08-31 ENCOUNTER — Encounter: Payer: Self-pay | Admitting: Physical Therapy

## 2022-08-31 ENCOUNTER — Ambulatory Visit: Payer: Commercial Managed Care - PPO | Attending: Obstetrics and Gynecology | Admitting: Physical Therapy

## 2022-08-31 ENCOUNTER — Telehealth: Payer: Self-pay

## 2022-08-31 DIAGNOSIS — R293 Abnormal posture: Secondary | ICD-10-CM | POA: Insufficient documentation

## 2022-08-31 DIAGNOSIS — M6281 Muscle weakness (generalized): Secondary | ICD-10-CM | POA: Diagnosis present

## 2022-08-31 DIAGNOSIS — O99891 Other specified diseases and conditions complicating pregnancy: Secondary | ICD-10-CM | POA: Diagnosis present

## 2022-08-31 DIAGNOSIS — M549 Dorsalgia, unspecified: Secondary | ICD-10-CM | POA: Diagnosis present

## 2022-08-31 DIAGNOSIS — R279 Unspecified lack of coordination: Secondary | ICD-10-CM | POA: Insufficient documentation

## 2022-08-31 DIAGNOSIS — O26892 Other specified pregnancy related conditions, second trimester: Secondary | ICD-10-CM | POA: Diagnosis present

## 2022-08-31 DIAGNOSIS — M62838 Other muscle spasm: Secondary | ICD-10-CM | POA: Insufficient documentation

## 2022-08-31 DIAGNOSIS — R102 Pelvic and perineal pain: Secondary | ICD-10-CM | POA: Insufficient documentation

## 2022-08-31 LAB — CERVICOVAGINAL ANCILLARY ONLY
Bacterial Vaginitis (gardnerella): NEGATIVE
Candida Glabrata: NEGATIVE
Candida Vaginitis: NEGATIVE
Chlamydia: NEGATIVE
Comment: NEGATIVE
Comment: NEGATIVE
Comment: NEGATIVE
Comment: NEGATIVE
Comment: NEGATIVE
Comment: NORMAL
Neisseria Gonorrhea: NEGATIVE
Trichomonas: NEGATIVE

## 2022-08-31 NOTE — Telephone Encounter (Signed)
Left message for patient to call back regarding Monday 09/04/22 ultrasound appt. - need to move to Tuesday 09/05/22, due to only 1 provider here on Monday 09/04/22

## 2022-08-31 NOTE — Patient Instructions (Signed)
Aquatic Therapy: What to Expect! ° °Where:  °[facility] [address] °[address] °[place], [place] [address] °[phone]          ° °How to Prepare: ° °If you require assistance with dressing, with transportation (ie: wheel chair), or toileting, a caregiver must attend the entire session with you (unless your primary therapists feels this is not necessary).   °If there is thunder during your appointment, you will be asked to leave pool area. You have the option to finish your session in the physical therapy area near the gym. °Masks in the pool area are optional. Your face will remain dry during your session, so you are welcome to keep your mask on, if desired. You will be spaced at least 6 feet from other aquatic patients.  °Please bring your own swim towel to dry off with.   °There are Men's and Women's locker rooms with showers, as well as gender neutral bathrooms in the pool area.  °Please arrive IN YOUR SUIT and a few minutes prior to your appointment - this helps to avoid delays in starting your session. °Head to the pool and await your appointment on the bench on the pool deck. °Please make sure to attend to any toileting needs prior to entering the pool. °Once on the pool deck your therapist will ask you to sign the Patient  Consent and Assignment of Benefits form. °Your therapist may take your blood pressure prior to, during and after your session if indicated. °We usually try and create a home exercise program based on activities we do in the pool. Some patients do not want to or do not have the ability to participate in an aquatic home program - this is not a barrier in any way to you participating in aquatic therapy as part of your current therapy plan! ° °Appointments:  All sessions are 45 minutes ° °About the pool: °Entering the pool: °Your therapist will assist you if needed; there are two ways to enter the pool - stairs or a mechanical lift. Your therapist will determine  the most appropriate way for you. °Water temperature is usually around 91-95°.  There is a lap pool with a temperature around 84° °There may be other therapists and patients in the pool at the same time.  ° °Contact Info:            °To cancel appointment, please call [facility] at [phone] °If you are running late, please call [facility] at [phone]    °        °     ° ° °

## 2022-09-01 ENCOUNTER — Encounter: Payer: Self-pay | Admitting: Physical Therapy

## 2022-09-01 ENCOUNTER — Ambulatory Visit: Payer: Commercial Managed Care - PPO | Admitting: Physical Therapy

## 2022-09-01 DIAGNOSIS — R293 Abnormal posture: Secondary | ICD-10-CM

## 2022-09-01 DIAGNOSIS — R279 Unspecified lack of coordination: Secondary | ICD-10-CM

## 2022-09-01 DIAGNOSIS — O99891 Other specified diseases and conditions complicating pregnancy: Secondary | ICD-10-CM

## 2022-09-01 DIAGNOSIS — M6281 Muscle weakness (generalized): Secondary | ICD-10-CM

## 2022-09-01 DIAGNOSIS — M62838 Other muscle spasm: Secondary | ICD-10-CM

## 2022-09-01 DIAGNOSIS — O26892 Other specified pregnancy related conditions, second trimester: Secondary | ICD-10-CM

## 2022-09-01 LAB — SPECIMEN STATUS REPORT

## 2022-09-01 LAB — URINE CULTURE, OB REFLEX

## 2022-09-01 LAB — CULTURE, OB URINE

## 2022-09-01 NOTE — Therapy (Signed)
OUTPATIENT PHYSICAL THERAPY TREATMENT NOTE   Patient Name: Monique Pruitt MRN: [id number] DOB:[birth year], 32 y.o., female Today's Date: 09/01/2022  PCP: None REFERRING PROVIDER: Lennart Pall, MD   END OF SESSION:   PT End of Session - 09/01/22 1524     Visit Number 2    Date for PT Re-Evaluation 10/26/22    Authorization Type [employer]    PT Start Time 1300    PT Stop Time 1345    PT Time Calculation (min) 45 min    Activity Tolerance Patient tolerated treatment well    Behavior During Therapy WFL for tasks assessed/performed             Past Medical History:  Diagnosis Date   Depression    Genital HSV    Migraine without aura 03/15/2021   Past Surgical History:  Procedure Laterality Date   WISDOM TOOTH EXTRACTION     Patient Active Problem List   Diagnosis Date Noted   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   Rh negative state in antepartum period 05/31/2022   History of HSV 05/29/2022   Encounter for supervision of normal pregnancy 05/25/2022    REFERRING DIAG: O99.891,M54.9 (ICD-10-CM) - Back pain affecting pregnancy in second trimester   THERAPY DIAG:  Back pain affecting pregnancy in second trimester  Pelvic pain affecting pregnancy in second trimester, antepartum  Unspecified lack of coordination  Abnormal posture  Muscle weakness (generalized)  Other muscle spasm  Rationale for Evaluation and Treatment Rehabilitation  PERTINENT HISTORY: H/O LBP and pelvic pain, left knee pain, 24 weeks and 1 day pregnant EDD: 12/20/22, depression  PRECAUTIONS: other, pregnant  SUBJECTIVE:                                                                                                                                                                                      SUBJECTIVE STATEMENT:  I am about a 6/10 pain today, I can move but it is not comfortabale.   PAIN:  Are you having pain? Yes: NPRS scale: 6/10 Pain location: low back, pelvis Pain  description: sore Aggravating factors: sitting too long, standing too long Relieving factors: changing positions   OBJECTIVE: (objective measures completed at initial evaluation unless otherwise dated)  DIAGNOSTIC FINDINGS:  N/A   SCREENING FOR RED FLAGS: Neg   COGNITION: Overall cognitive status: Within functional limits for tasks assessed                          SENSATION: WFL   MUSCLE LENGTH: HS: mild tightness bil Piriformis:WNL   POSTURE: No Significant postural limitations   PALPATION: TTP in bil gluteals,  lumbar   LUMBAR ROM: WNL, some tightness with left rotation 25% deficit   LOWER EXTREMITY ROM:   WNL   LOWER EXTREMITY MMT:  *pelvic pain unless noted otherwise   MMT Right eval Left eval  Hip flexion 4+ with pain right LB 5  Hip extension 5 5  Hip abduction 5* 4*r  Hip adduction 5 4+  Hip internal rotation      Hip external rotation      Knee flexion 5 4+  Knee extension 5 4+*  Ankle dorsiflexion 5 5   (Blank rows = not tested)   LUMBAR SPECIAL TESTS:  FABER test: Negative, negative SIJ sompression/distraction   FUNCTIONAL TESTS:  5 times sit to stand: 23.88 sec pelvic/low pain at 6/10   MODIFIED OSWESTRY: TBD     TODAY'S TREATMENT:                                                                                                                              DATE:    07/03/22: Pt arrives for aquatic physical therapy. Treatment took place in 3.5-5.5 feet of water. Water temperature was 90 degrees F. Pt entered the pool via stairs independently but slowly and with moderate use of the rail. Pt requires buoyancy of water for support and to offload joints with strengthening exercises.   Seated water bench with 75% submersion Pt performed seated LE AROM exercises 20x in all planes, with concurrent education on water principles and how we would use them, verbal understanding.  Seated decompression hang with yellow noodle, VC to relax pelvis 2 min. 75%  submersion water walking 4 lengths in each direction 1 min seated decompression hang rest in between yellow noodle used for postural support. Standing against wall: posterior pelvic tilt 5 sec hold 5x. Verbally educated pt to use this stretch/exercise for pain management or if she finds she has to stand for awhile. Pt verbally understood.    07/02/22 See pt ed and HEP     PATIENT EDUCATION:  Education details: PT eval findings, anticipated POC, need for further assessment of pelvic pain, initial HEP, postural awareness, education on log rolling for bed mobility, and discussed acquiring a belly band for support.   Person educated: Patient Education method: Explanation, Demonstration, Verbal cues, and Handouts Education comprehension: verbalized understanding and returned demonstration   HOME EXERCISE PROGRAM: Access Code: [email or web address] URL: [email or web address] Date: 08/31/2022 Prepared by: Raynelle Fanning   Exercises - Seated Transversus Abdominis Bracing  - 2 x daily - 7 x weekly - 2 sets - 10 reps - Seated Pelvic Tilt  - 2 x daily - 7 x weekly - 2 sets - 10 reps - Cat Cow  - 2 x daily - 7 x weekly - 2 sets - 10 reps - 3 hold - Standing Lumbar Extension  - 2 x daily - 7 x weekly - 2 sets - 10 reps - Reclined Diaphragmatic Breathing  - 1 x daily - 7 x  weekly - 1 sets - 20 reps - 1 2 second hold   ASSESSMENT:   CLINICAL IMPRESSION: Pt arrives for first aquatic PT treatment today with moderate pain but reports this kind of pain level she can at least move with. Pt was educated in water principles and how we would use them, verbal understanding. Pt reported pain was abolished while moving in the water. Pt reports a feeling of gentle stretch today with the support of the water.   OBJECTIVE IMPAIRMENTS: decreased activity tolerance, decreased ROM, decreased strength, increased muscle spasms, impaired flexibility, and pain.    ACTIVITY LIMITATIONS: sitting, standing, transfers, and  locomotion level   PARTICIPATION LIMITATIONS: occupation   PERSONAL FACTORS: Past/current experiences and 1-2 comorbidities: previous LB/pelvic pain and left knee pain  are also affecting patient's functional outcome.    REHAB POTENTIAL: Excellent   CLINICAL DECISION MAKING: Stable/uncomplicated   EVALUATION COMPLEXITY: Low     GOALS: Goals reviewed with patient? Yes   SHORT TERM GOALS: Target date: 09/14/2022   The patient will demonstrate knowledge of basic self care strategies and exercises to promote healing including log-rolling to get in/out of bed   Baseline: Goal status: INITIAL   2.  The patient will report a 30% improvement in pain levels with functional activities which are currently difficult including sit to stand, turning over in bed, getting in/out of car and walking. Baseline:  Goal status: INITIAL   3.  Patient to complete Modified Oswestry by 2nd land visit for baseline functional assessment. Baseline:  Goal status: INITIAL       LONG TERM GOALS: Target date: 10/26/2022   The patient will be independent in a safe self progression of a home exercise program to promote further recovery of function   Baseline:  Goal status: INITIAL   2. The patient will report a 75% improvement in pain levels with functional activities which are currently difficult including bed mobility, sit to stand, getting in/out of car and walking Baseline:  Goal status: INITIAL   3.  The patient will have improved hip flexor and extensor muscle strength to 5/5 needed for lifting medium weight objects and carrying her infant  Baseline:  Goal status: INITIAL   4.  Improved modified Oswestry score by 12% indicating improved function with less pain Baseline: TBD Goal status: INITIAL     PLAN:   PT FREQUENCY: 2x/week   PT DURATION: 8 weeks   PLANNED INTERVENTIONS: Therapeutic exercises, Therapeutic activity, Neuromuscular re-education, Patient/Family education, Self Care, Joint  mobilization, Aquatic Therapy, Spinal mobilization, Cryotherapy, Moist heat, Taping, and Manual therapy.   PLAN FOR NEXT SESSION: land next, see how pt did with aquatics. Pt will try to get into the [employer] since her next PT aquatic appt is not until mid May.  Coburn Knaus, PTA 09/01/2022, 3:39 PM

## 2022-09-01 NOTE — Therapy (Deleted)
OUTPATIENT PHYSICAL THERAPY TREATMENT NOTE   Patient Name: Monique Pruitt MRN: [id number] DOB:01-26-1991, 32 y.o., female Today's Date: 09/01/2022  PCP: None REFERRING PROVIDER: Lennart Pall, MD   END OF SESSION:    Past Medical History:  Diagnosis Date   Depression    Genital HSV    Migraine without aura 03/15/2021   Past Surgical History:  Procedure Laterality Date   WISDOM TOOTH EXTRACTION     Patient Active Problem List   Diagnosis Date Noted   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   Rh negative state in antepartum period 05/31/2022   History of HSV 05/29/2022   Encounter for supervision of normal pregnancy 05/25/2022    REFERRING DIAG:  O99.891,M54.9 (ICD-10-CM) - Back pain affecting pregnancy in second trimester       THERAPY DIAG:  Back pain affecting pregnancy in second trimester   Pelvic pain affecting pregnancy in second trimester, antepartum   ONSET DATE: pelvic pain March 1, back pain mid January 2023   SUBJECTIVE:                                                                                                                                                                                            SUBJECTIVE STATEMENT:   PERTINENT HISTORY:  H/O LBP and pelvic pain, left knee pain, 24 weeks and 1 day pregnant EDD: 12/20/22, depression   PAIN:  Are you having pain? Yes: NPRS scale: 3 at rest up to 10/10 Pain location: tailbone Pain description: pinching, shooting pains out and down Aggravating factors: prolonged positioning Relieving factors: changing position   Are you having pain? Yes: NPRS scale: 7 now up to 10/10 Pain location: left ant pelvis Pain description: constant, bruise that's being pressed on Aggravating factors: on left side or rolling on left pain to back, getting up from sitting or bed or low seated car. Walking Relieving factors: swimming   PRECAUTIONS: Other: pregnancy   WEIGHT BEARING RESTRICTIONS: No   FALLS:   Has patient fallen in last 6 months? 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   PLOF: Independent   PATIENT GOALS: to keep pain from getting worse   NEXT MD VISIT: mid May   OBJECTIVE:    DIAGNOSTIC FINDINGS:  N/A   SCREENING FOR RED FLAGS: Neg   COGNITION: Overall cognitive status: Within functional limits for tasks assessed  SENSATION: WFL   MUSCLE LENGTH: HS: mild tightness bil Piriformis:WNL   POSTURE: No Significant postural limitations   PALPATION: TTP in bil gluteals, lumbar   LUMBAR ROM: WNL, some tightness with left rotation 25% deficit   LOWER EXTREMITY ROM:   WNL   LOWER EXTREMITY MMT:  *pelvic pain unless noted otherwise   MMT Right eval Left eval  Hip flexion 4+ with pain right LB 5  Hip extension 5 5  Hip abduction 5* 4*r  Hip adduction 5 4+  Hip internal rotation      Hip external rotation      Knee flexion 5 4+  Knee extension 5 4+*  Ankle dorsiflexion 5 5   (Blank rows = not tested)   LUMBAR SPECIAL TESTS:  FABER test: Negative, negative SIJ sompression/distraction   FUNCTIONAL TESTS:  5 times sit to stand: 23.88 sec pelvic/low pain at 6/10   MODIFIED OSWESTRY: TBD     TODAY'S TREATMENT:                                                                                                                              DATE:    09/01/22:Pt arrives for aquatic physical therapy. Treatment took place in 3.5-5.5 feet of water. Water temperature was 90 degreesF. Pt entered the pool via stairs reciprocally with light hand rail use. Pt requires buoyancy of water for support and to offload joints with strengthening exercises.   Seated water bench with 75% submersion Pt performed seated LE AROM exercises 20x in all planes, concurrent education  on water principles and how we would use them.   07/02/22 See pt ed and HEP     PATIENT EDUCATION:  Education details: PT eval findings, anticipated POC, need for further assessment of pelvic pain, initial HEP, postural awareness, education on log rolling for bed mobility, and discussed acquiring a belly band for support.   Person educated: Patient Education method: Explanation, Demonstration, Verbal cues, and Handouts Education comprehension: verbalized understanding and returned demonstration   HOME EXERCISE PROGRAM: Access Code: [email or web address] URL: [email or web address] Date: 08/31/2022 Prepared by: Raynelle Fanning   Exercises - Seated Transversus Abdominis Bracing  - 2 x daily - 7 x weekly - 2 sets - 10 reps - Seated Pelvic Tilt  - 2 x daily - 7 x weekly - 2 sets - 10 reps - Cat Cow  - 2 x daily - 7 x weekly - 2 sets - 10 reps - 3 hold - Standing Lumbar Extension  - 2 x daily - 7 x weekly - 2 sets - 10 reps - Reclined Diaphragmatic Breathing  - 1 x daily - 7 x weekly - 1 sets - 20 reps - 1 2 second hold   ASSESSMENT:   CLINICAL IMPRESSION:     OBJECTIVE IMPAIRMENTS: decreased activity tolerance, decreased ROM, decreased strength, increased muscle spasms, impaired flexibility, and pain.    ACTIVITY LIMITATIONS: sitting, standing, transfers, and locomotion level   PARTICIPATION LIMITATIONS:  occupation   PERSONAL FACTORS: Past/current experiences and 1-2 comorbidities: previous LB/pelvic pain and left knee pain  are also affecting patient's functional outcome.    REHAB POTENTIAL: Excellent   CLINICAL DECISION MAKING: Stable/uncomplicated   EVALUATION COMPLEXITY: Low     GOALS: Goals reviewed with patient? Yes   SHORT TERM GOALS: Target date: 09/14/2022   The patient will demonstrate knowledge of basic self care strategies and exercises to promote healing including log-rolling to get in/out of bed   Baseline: Goal status: INITIAL   2.  The patient will report a 30%  improvement in pain levels with functional activities which are currently difficult including sit to stand, turning over in bed, getting in/out of car and walking. Baseline:  Goal status: INITIAL   3.  Patient to complete Modified Oswestry by 2nd land visit for baseline functional assessment. Baseline:  Goal status: INITIAL       LONG TERM GOALS: Target date: 10/26/2022   The patient will be independent in a safe self progression of a home exercise program to promote further recovery of function   Baseline:  Goal status: INITIAL   2. The patient will report a 75% improvement in pain levels with functional activities which are currently difficult including bed mobility, sit to stand, getting in/out of car and walking Baseline:  Goal status: INITIAL   3.  The patient will have improved hip flexor and extensor muscle strength to 5/5 needed for lifting medium weight objects and carrying her infant  Baseline:  Goal status: INITIAL   4.  Improved modified Oswestry score by 12% indicating improved function with less pain Baseline: TBD Goal status: INITIAL     PLAN:   PT FREQUENCY: 2x/week   PT DURATION: 8 weeks   PLANNED INTERVENTIONS: Therapeutic exercises, Therapeutic activity, Neuromuscular re-education, Patient/Family education, Self Care, Joint mobilization, Aquatic Therapy, Spinal mobilization, Cryotherapy, Moist heat, Taping, and Manual therapy.   PLAN FOR NEXT SESSION: Complete Modified Oswestry by 2nd land visit, Progress and review HEP for hip/knee/core/hip flexor strengthening,     Ane Payment, PTA 09/01/22 12:09 PM              Outpatient Rehab on 08/31/2022       Detailed Report      Note viewed by patient Additional Documentation  Vitals: LMP 03/15/2022  Flowsheets: PT End of Session,   Healthcare Directives,   Screening,   8 Minute Rule PT  Encounter Info: Billing Info,   History,   Allergies,   Detailed Report   Linked  Episodes  back pain affecting pregnancy Noted 08/31/2022 Orders Placed  PT plan of care cert/re-cert Medication Changes   None Medication List Visit Diagnoses   Back pain affecting pregnancy in second trimester  Pelvic pain affecting pregnancy in second trimester, antepartum Problem List  THERAPY DIAG:  Back pain affecting pregnancy in second trimester  Pelvic pain affecting pregnancy in second trimester, antepartum  Unspecified lack of coordination  Abnormal posture  Muscle weakness (generalized)  Other muscle spasm  Rationale for Evaluation and Treatment {[id number]}  PERTINENT HISTORY: ***  PRECAUTIONS: ***  SUBJECTIVE:  SUBJECTIVE STATEMENT:  ***   PAIN:  Are you having pain? {OPRCPAIN:[id number]}   OBJECTIVE: (objective measures completed at initial evaluation unless otherwise dated)   (Copy Eval's Objective through Plan section here)   Dayle Mcnerney, PTA 09/01/2022, 12:04 PM

## 2022-09-04 ENCOUNTER — Ambulatory Visit: Payer: Commercial Managed Care - PPO | Attending: Maternal & Fetal Medicine

## 2022-09-04 ENCOUNTER — Encounter: Payer: Commercial Managed Care - PPO | Admitting: Obstetrics and Gynecology

## 2022-09-04 ENCOUNTER — Ambulatory Visit: Payer: Commercial Managed Care - PPO | Admitting: *Deleted

## 2022-09-04 VITALS — BP 118/67 | HR 103

## 2022-09-04 DIAGNOSIS — Z362 Encounter for other antenatal screening follow-up: Secondary | ICD-10-CM | POA: Diagnosis present

## 2022-09-04 DIAGNOSIS — Z3A24 24 weeks gestation of pregnancy: Secondary | ICD-10-CM | POA: Diagnosis not present

## 2022-09-04 DIAGNOSIS — O360190 Maternal care for anti-D [Rh] antibodies, unspecified trimester, not applicable or unspecified: Secondary | ICD-10-CM

## 2022-09-04 DIAGNOSIS — O3503X0 Maternal care for (suspected) central nervous system malformation or damage in fetus, choroid plexus cysts, not applicable or unspecified: Secondary | ICD-10-CM

## 2022-09-07 ENCOUNTER — Ambulatory Visit: Payer: Commercial Managed Care - PPO | Attending: Obstetrics and Gynecology

## 2022-09-07 ENCOUNTER — Ambulatory Visit: Payer: Commercial Managed Care - PPO | Admitting: Rehabilitative and Restorative Service Providers"

## 2022-09-07 DIAGNOSIS — M549 Dorsalgia, unspecified: Secondary | ICD-10-CM | POA: Insufficient documentation

## 2022-09-07 DIAGNOSIS — O99891 Other specified diseases and conditions complicating pregnancy: Secondary | ICD-10-CM | POA: Diagnosis present

## 2022-09-07 DIAGNOSIS — R262 Difficulty in walking, not elsewhere classified: Secondary | ICD-10-CM

## 2022-09-07 DIAGNOSIS — R102 Pelvic and perineal pain: Secondary | ICD-10-CM | POA: Insufficient documentation

## 2022-09-07 DIAGNOSIS — M62838 Other muscle spasm: Secondary | ICD-10-CM

## 2022-09-07 DIAGNOSIS — R293 Abnormal posture: Secondary | ICD-10-CM | POA: Diagnosis present

## 2022-09-07 DIAGNOSIS — O26892 Other specified pregnancy related conditions, second trimester: Secondary | ICD-10-CM | POA: Diagnosis present

## 2022-09-07 DIAGNOSIS — M6281 Muscle weakness (generalized): Secondary | ICD-10-CM | POA: Insufficient documentation

## 2022-09-07 DIAGNOSIS — R279 Unspecified lack of coordination: Secondary | ICD-10-CM | POA: Diagnosis present

## 2022-09-07 NOTE — Therapy (Addendum)
OUTPATIENT PHYSICAL THERAPY TREATMENT NOTE   Patient Name: Monique Pruitt MRN: [id number] DOB:03-23-1991, 32 y.o., female Today's Date: 09/07/2022  PCP: None REFERRING PROVIDER: Lennart Pall, MD   END OF SESSION:   PT End of Session - 09/07/22 0936     Visit Number 3    Date for PT Re-Evaluation 10/26/22    Authorization Type [employer]    PT Start Time [phone]    PT Stop Time 1020    PT Time Calculation (min) 49 min    Activity Tolerance Patient tolerated treatment well    Behavior During Therapy [facility] for tasks assessed/performed             Past Medical History:  Diagnosis Date   Depression    Genital HSV    Migraine without aura 03/15/2021   Past Surgical History:  Procedure Laterality Date   WISDOM TOOTH EXTRACTION     Patient Active Problem List   Diagnosis Date Noted   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   Rh negative state in antepartum period 05/31/2022   History of HSV 05/29/2022   Encounter for supervision of normal pregnancy 05/25/2022    REFERRING DIAG: O99.891,M54.9 (ICD-10-CM) - Back pain affecting pregnancy in second trimester   THERAPY DIAG:  Back pain affecting pregnancy in second trimester  Pelvic pain affecting pregnancy in second trimester, antepartum  Muscle weakness (generalized)  Other muscle spasm  Difficulty in walking, not elsewhere classified  Rationale for Evaluation and Treatment Rehabilitation  PERTINENT HISTORY: H/O LBP and pelvic pain, left knee pain, 24 weeks and 1 day pregnant EDD: 12/20/22, depression  PRECAUTIONS: other, pregnant  SUBJECTIVE:                                                                                                                                                                                      SUBJECTIVE STATEMENT:  Patient reports no significant change in pain.  Reports pain at 6-8/10.     PAIN:  Are you having pain? Yes: NPRS scale: 6-8/10 Pain location: low back, pelvis Pain  description: sore Aggravating factors: sitting too long, standing too long Relieving factors: changing positions   OBJECTIVE: (objective measures completed at initial evaluation unless otherwise dated)  DIAGNOSTIC FINDINGS:  N/A   SCREENING FOR RED FLAGS: Neg   COGNITION: Overall cognitive status: Within functional limits for tasks assessed                          SENSATION: WFL   MUSCLE LENGTH: HS: mild tightness bil Piriformis:WNL   POSTURE: No Significant postural limitations   PALPATION: TTP in bil gluteals, lumbar  LUMBAR ROM: WNL, some tightness with left rotation 25% deficit   LOWER EXTREMITY ROM:   WNL   LOWER EXTREMITY MMT:  *pelvic pain unless noted otherwise   MMT Right eval Left eval  Hip flexion 4+ with pain right LB 5  Hip extension 5 5  Hip abduction 5* 4*r  Hip adduction 5 4+  Hip internal rotation      Hip external rotation      Knee flexion 5 4+  Knee extension 5 4+*  Ankle dorsiflexion 5 5   (Blank rows = not tested)   LUMBAR SPECIAL TESTS:  FABER test: Negative, negative SIJ sompression/distraction   FUNCTIONAL TESTS:  5 times sit to stand: 23.88 sec pelvic/low pain at 6/10   MODIFIED OSWESTRY: TBD     TODAY'S TREATMENT:                                                                                                                              DATE: 09/07/22 Nustep x 5 min level 5 (PT present to discuss goals and status) Standing hamstring stretch 3 x 30 sec each LE Standing quad/hip flexor stretch 3 x 30 sec each LE Lateral band walks with blue band x 3 laps of 10 steps each way Cone touches 3 x 10 each LE Supine IT band stretch with strap 3 x 30 sec each LE Supine Hip Adduction Isometric with Ball x 10 hold 2-3 sec Hooklying Isometric Hip Abduction with Belt x 10 hold 2-3 sec Reviewed HEP and made suggestions to progression   DATE:    07/03/22: Pt arrives for aquatic physical therapy. Treatment took place in 3.5-5.5 feet of  water. Water temperature was 90 degrees F. Pt entered the pool via stairs independently but slowly and with moderate use of the rail. Pt requires buoyancy of water for support and to offload joints with strengthening exercises.   Seated water bench with 75% submersion Pt performed seated LE AROM exercises 20x in all planes, with concurrent education on water principles and how we would use them, verbal understanding.  Seated decompression hang with yellow noodle, VC to relax pelvis 2 min. 75% submersion water walking 4 lengths in each direction 1 min seated decompression hang rest in between yellow noodle used for postural support. Standing against wall: posterior pelvic tilt 5 sec hold 5x. Verbally educated pt to use this stretch/exercise for pain management or if she finds she has to stand for awhile. Pt verbally understood.    07/02/22 See pt ed and HEP     PATIENT EDUCATION:  Education details: PT eval findings, anticipated POC, need for further assessment of pelvic pain, initial HEP, postural awareness, education on log rolling for bed mobility, and discussed acquiring a belly band for support.   Person educated: Patient Education method: Explanation, Demonstration, Verbal cues, and Handouts Education comprehension: verbalized understanding and returned demonstration   HOME EXERCISE PROGRAM:  Access Code: [email or web address] URL: [email or web address] Date: 09/07/2022 Prepared  by: Mikey Kirschner  Exercises - Seated Transversus Abdominis Bracing  - 2 x daily - 7 x weekly - 2 sets - 10 reps - Seated Pelvic Tilt  - 2 x daily - 7 x weekly - 2 sets - 10 reps - Cat Cow  - 2 x daily - 7 x weekly - 2 sets - 10 reps - 3 hold - Standing Lumbar Extension  - 2 x daily - 7 x weekly - 2 sets - 10 reps - Reclined Diaphragmatic Breathing  - 1 x daily - 7 x weekly - 1 sets - 20 reps - 1 2 second hold - Standing Hamstring Stretch on Chair  - 1 x daily - 7 x weekly - 1 sets - 3 reps - 30 sec hold -  Standing Quad Stretch with Table and Chair Support  - 1 x daily - 7 x weekly - 1 sets - 3 reps - 30 sec hold - Supine ITB Stretch with Strap  - 1 x daily - 7 x weekly - 1 sets - 3 reps - 30 sec hold - Supine Hip Adduction Isometric with Ball  - 1 x daily - 7 x weekly - 1 sets - 10 reps - 10 sec hold - Hooklying Isometric Hip Abduction with Belt  - 1 x daily - 7 x weekly - 1 sets - 10 reps - 10 sec hold  ASSESSMENT:   CLINICAL IMPRESSION: Tamah was able to complete all tolerate all activities today with minimal pain.  She did have some discomfort in her left knee (where she had a previous knee injury) while she was on Nustep but this was mild.  She has more obvious weakness in the right hip/ glut medius with band walks and single leg  activities.  She would benefit from continuing skilled PT for pelvic mobility, hip strengthening and core stabilization.        OBJECTIVE IMPAIRMENTS: decreased activity tolerance, decreased ROM, decreased strength, increased muscle spasms, impaired flexibility, and pain.    ACTIVITY LIMITATIONS: sitting, standing, transfers, and locomotion level   PARTICIPATION LIMITATIONS: occupation   PERSONAL FACTORS: Past/current experiences and 1-2 comorbidities: previous LB/pelvic pain and left knee pain  are also affecting patient's functional outcome.    REHAB POTENTIAL: Excellent   CLINICAL DECISION MAKING: Stable/uncomplicated   EVALUATION COMPLEXITY: Low     GOALS: Goals reviewed with patient? Yes   SHORT TERM GOALS: Target date: 09/14/2022   The patient will demonstrate knowledge of basic self care strategies and exercises to promote healing including log-rolling to get in/out of bed   Baseline: Goal status: INITIAL   2.  The patient will report a 30% improvement in pain levels with functional activities which are currently difficult including sit to stand, turning over in bed, getting in/out of car and walking. Baseline:  Goal status: INITIAL   3.   Patient to complete Modified Oswestry by 2nd land visit for baseline functional assessment. Baseline:  Goal status: INITIAL       LONG TERM GOALS: Target date: 10/26/2022   The patient will be independent in a safe self progression of a home exercise program to promote further recovery of function   Baseline:  Goal status: INITIAL   2. The patient will report a 75% improvement in pain levels with functional activities which are currently difficult including bed mobility, sit to stand, getting in/out of car and walking Baseline:  Goal status: INITIAL   3.  The patient will have improved hip flexor  and extensor muscle strength to 5/5 needed for lifting medium weight objects and carrying her infant  Baseline:  Goal status: INITIAL   4.  Improved modified Oswestry score by 12% indicating improved function with less pain Baseline: TBD Goal status: INITIAL     PLAN:   PT FREQUENCY: 2x/week   PT DURATION: 8 weeks   PLANNED INTERVENTIONS: Therapeutic exercises, Therapeutic activity, Neuromuscular re-education, Patient/Family education, Self Care, Joint mobilization, Aquatic Therapy, Spinal mobilization, Cryotherapy, Moist heat, Taping, and Manual therapy.   PLAN FOR NEXT SESSION: Continue with emphasis on pelvic mobility and hip strengthening along with core strengthening.  Victorino Dike B. Xavian Hardcastle, PT 09/14/22 9:26 AM [facility] [address], [address] [place], [place] [address] Phone # [phone] Fax [phone]

## 2022-09-14 ENCOUNTER — Ambulatory Visit: Payer: Commercial Managed Care - PPO

## 2022-09-14 DIAGNOSIS — M6281 Muscle weakness (generalized): Secondary | ICD-10-CM

## 2022-09-14 DIAGNOSIS — O99891 Other specified diseases and conditions complicating pregnancy: Secondary | ICD-10-CM | POA: Diagnosis not present

## 2022-09-14 DIAGNOSIS — R102 Pelvic and perineal pain: Secondary | ICD-10-CM

## 2022-09-14 DIAGNOSIS — M549 Dorsalgia, unspecified: Secondary | ICD-10-CM

## 2022-09-14 DIAGNOSIS — M62838 Other muscle spasm: Secondary | ICD-10-CM

## 2022-09-14 NOTE — Therapy (Signed)
OUTPATIENT PHYSICAL THERAPY TREATMENT NOTE   Patient Name: Monique Pruitt MRN: [id number] DOB:February 13, 1991, 32 y.o., female Today's Date: 09/14/2022  PCP: None REFERRING PROVIDER: Lennart Pall, MD   END OF SESSION:   PT End of Session - 09/14/22 0854     Visit Number 4    Date for PT Re-Evaluation 10/26/22    Authorization Type [employer]    PT Start Time 0803    PT Stop Time 0844    PT Time Calculation (min) 41 min    Activity Tolerance Patient tolerated treatment well    Behavior During Therapy [facility] for tasks assessed/performed              Past Medical History:  Diagnosis Date   Depression    Genital HSV    Migraine without aura 03/15/2021   Past Surgical History:  Procedure Laterality Date   WISDOM TOOTH EXTRACTION     Patient Active Problem List   Diagnosis Date Noted   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   Rh negative state in antepartum period 05/31/2022   History of HSV 05/29/2022   Encounter for supervision of normal pregnancy 05/25/2022    REFERRING DIAG: O99.891,M54.9 (ICD-10-CM) - Back pain affecting pregnancy in second trimester   THERAPY DIAG:  Back pain affecting pregnancy in second trimester  Muscle weakness (generalized)  Pelvic pain affecting pregnancy in second trimester, antepartum  Other muscle spasm  Rationale for Evaluation and Treatment Rehabilitation  PERTINENT HISTORY: H/O LBP and pelvic pain, left knee pain, 24 weeks and 1 day pregnant EDD: 12/20/22, depression  PRECAUTIONS: other, pregnant  SUBJECTIVE:                                                                                                                                                                                      SUBJECTIVE STATEMENT:  Today is a bad day. I didn't sleep well last night.  5% overall reduction in pain since the start of care.    PAIN:  Are you having pain? Yes: NPRS scale: 6-7/10 Pain location: low back, pelvis Pain description:  sore Aggravating factors: sitting too long, standing too long Relieving factors: changing positions, being in the water    OBJECTIVE: (objective measures completed at initial evaluation unless otherwise dated)  DIAGNOSTIC FINDINGS:  N/A   SCREENING FOR RED FLAGS: Neg   COGNITION: Overall cognitive status: Within functional limits for tasks assessed                          SENSATION: WFL   MUSCLE LENGTH: HS: mild tightness bil Piriformis:WNL   POSTURE: No Significant postural limitations  PALPATION: TTP in bil gluteals, lumbar   LUMBAR ROM: WNL, some tightness with left rotation 25% deficit   LOWER EXTREMITY ROM:   WNL   LOWER EXTREMITY MMT:  *pelvic pain unless noted otherwise   MMT Right eval Left eval  Hip flexion 4+ with pain right LB 5  Hip extension 5 5  Hip abduction 5* 4*r  Hip adduction 5 4+  Hip internal rotation      Hip external rotation      Knee flexion 5 4+  Knee extension 5 4+*  Ankle dorsiflexion 5 5   (Blank rows = not tested)   LUMBAR SPECIAL TESTS:  FABER test: Negative, negative SIJ sompression/distraction   FUNCTIONAL TESTS:  5 times sit to stand: 23.88 sec pelvic/low pain at 6/10   MODIFIED OSWESTRY: TBD     TODAY'S TREATMENT:           DATE: 09/14/22 Nustep x 6 min level 5 (PT present to discuss goals and status) Seated hamstring stretch 3 x 30 sec each LE Seated hamstring with ITB emphasis 2x20 seconds bil  Thread the needle: x3 each side  Supine hip abduction with blue band: TA and pelvic floor activation  Supine ball squeeze with TA and pelvic floor activation 5" hold x10 Sidelying clam and reverse clam x10 each  Standing quad/hip flexor stretch 3 x 30 sec each LE                                                                                                                   DATE: 09/07/22 Nustep x 5 min level 5 (PT present to discuss goals and status) Standing hamstring stretch 3 x 30 sec each LE Standing quad/hip  flexor stretch 3 x 30 sec each LE Supine IT band stretch with strap 3 x 30 sec each LE   DATE:    07/03/22: Pt arrives for aquatic physical therapy. Treatment took place in 3.5-5.5 feet of water. Water temperature was 90 degrees F. Pt entered the pool via stairs independently but slowly and with moderate use of the rail. Pt requires buoyancy of water for support and to offload joints with strengthening exercises.   Seated water bench with 75% submersion Pt performed seated LE AROM exercises 20x in all planes, with concurrent education on water principles and how we would use them, verbal understanding.  Seated decompression hang with yellow noodle, VC to relax pelvis 2 min. 75% submersion water walking 4 lengths in each direction 1 min seated decompression hang rest in between yellow noodle used for postural support. Standing against wall: posterior pelvic tilt 5 sec hold 5x. Verbally educated pt to use this stretch/exercise for pain management or if she finds she has to stand for awhile. Pt verbally understood.    07/02/22 See pt ed and HEP     PATIENT EDUCATION:  Education details: PT eval findings, anticipated POC, need for further assessment of pelvic pain, initial HEP, postural awareness, education on log rolling for bed mobility, and discussed acquiring  a belly band for support.   Person educated: Patient Education method: Explanation, Demonstration, Verbal cues, and Handouts Education comprehension: verbalized understanding and returned demonstration   HOME EXERCISE PROGRAM:  Access Code: [email or web address] URL: [email or web address] Date: 09/07/2022 Prepared by: Mikey Kirschner  Exercises - Seated Transversus Abdominis Bracing  - 2 x daily - 7 x weekly - 2 sets - 10 reps - Seated Pelvic Tilt  - 2 x daily - 7 x weekly - 2 sets - 10 reps - Cat Cow  - 2 x daily - 7 x weekly - 2 sets - 10 reps - 3 hold - Standing Lumbar Extension  - 2 x daily - 7 x weekly - 2 sets - 10 reps -  Reclined Diaphragmatic Breathing  - 1 x daily - 7 x weekly - 1 sets - 20 reps - 1 2 second hold - Standing Hamstring Stretch on Chair  - 1 x daily - 7 x weekly - 1 sets - 3 reps - 30 sec hold - Standing Quad Stretch with Table and Chair Support  - 1 x daily - 7 x weekly - 1 sets - 3 reps - 30 sec hold - Supine Hip Adduction Isometric with Ball  - 1 x daily - 7 x weekly - 1 sets - 10 reps - 10 sec hold - Hooklying Isometric Hip Abduction with Belt  - 1 x daily - 7 x weekly - 1 sets - 10 reps - 10 sec hold  ASSESSMENT:   CLINICAL IMPRESSION: Pt reports 5% overall improvement in symptoms since the start of care.  She has been able to complete her exercises and ITB stretch with strap was aggravating so PT modified to be performed seated. Pt is using log roll and modified mechanics to reduce pain. Pt did well with all exercises and didn't have increased pain. Patient will benefit from skilled PT to address the below impairments and improve overall function.    OBJECTIVE IMPAIRMENTS: decreased activity tolerance, decreased ROM, decreased strength, increased muscle spasms, impaired flexibility, and pain.    ACTIVITY LIMITATIONS: sitting, standing, transfers, and locomotion level   PARTICIPATION LIMITATIONS: occupation   PERSONAL FACTORS: Past/current experiences and 1-2 comorbidities: previous LB/pelvic pain and left knee pain  are also affecting patient's functional outcome.    REHAB POTENTIAL: Excellent   CLINICAL DECISION MAKING: Stable/uncomplicated   EVALUATION COMPLEXITY: Low     GOALS: Goals reviewed with patient? Yes   SHORT TERM GOALS: Target date: 09/14/2022   The patient will demonstrate knowledge of basic self care strategies and exercises to promote healing including log-rolling to get in/out of bed   Baseline: Goal status:MET   2.  The patient will report a 30% improvement in pain levels with functional activities which are currently difficult including sit to stand, turning  over in bed, getting in/out of car and walking. Baseline:  5% improvement (09/14/22) Goal status: INITIAL   3.  Patient to complete Modified Oswestry by 2nd land visit for baseline functional assessment. Baseline:  Goal status: INITIAL       LONG TERM GOALS: Target date: 10/26/2022   The patient will be independent in a safe self progression of a home exercise program to promote further recovery of function   Baseline:  Goal status: INITIAL   2. The patient will report a 75% improvement in pain levels with functional activities which are currently difficult including bed mobility, sit to stand, getting in/out of car and walking Baseline:  Goal status: INITIAL  3.  The patient will have improved hip flexor and extensor muscle strength to 5/5 needed for lifting medium weight objects and carrying her infant  Baseline:  Goal status: INITIAL   4.  Improved modified Oswestry score by 12% indicating improved function with less pain Baseline: TBD Goal status: INITIAL     PLAN:   PT FREQUENCY: 2x/week   PT DURATION: 8 weeks   PLANNED INTERVENTIONS: Therapeutic exercises, Therapeutic activity, Neuromuscular re-education, Patient/Family education, Self Care, Joint mobilization, Aquatic Therapy, Spinal mobilization, Cryotherapy, Moist heat, Taping, and Manual therapy.   PLAN FOR NEXT SESSION: Pt will see pelvic floor therapist next Lorrene Reid, PT 09/14/22 8:56 AM

## 2022-09-15 ENCOUNTER — Ambulatory Visit: Payer: Commercial Managed Care - PPO | Admitting: Physical Therapy

## 2022-09-15 ENCOUNTER — Encounter: Payer: Self-pay | Admitting: Physical Therapy

## 2022-09-15 DIAGNOSIS — O99891 Other specified diseases and conditions complicating pregnancy: Secondary | ICD-10-CM

## 2022-09-15 DIAGNOSIS — R279 Unspecified lack of coordination: Secondary | ICD-10-CM

## 2022-09-15 DIAGNOSIS — R262 Difficulty in walking, not elsewhere classified: Secondary | ICD-10-CM

## 2022-09-15 DIAGNOSIS — R293 Abnormal posture: Secondary | ICD-10-CM

## 2022-09-15 DIAGNOSIS — M62838 Other muscle spasm: Secondary | ICD-10-CM

## 2022-09-15 DIAGNOSIS — O26892 Other specified pregnancy related conditions, second trimester: Secondary | ICD-10-CM

## 2022-09-15 DIAGNOSIS — M6281 Muscle weakness (generalized): Secondary | ICD-10-CM

## 2022-09-15 NOTE — Therapy (Signed)
OUTPATIENT PHYSICAL THERAPY TREATMENT NOTE   Patient Name: Monique Pruitt MRN: [id number] DOB:1991/01/14, 32 y.o., female Today's Date: 09/15/2022  PCP: None REFERRING PROVIDER: Lennart Pall, MD   END OF SESSION:   PT End of Session - 09/15/22 1423     Visit Number 5    Date for PT Re-Evaluation 10/26/22    Authorization Type [employer]    PT Start Time 1210    PT Stop Time 1300    PT Time Calculation (min) 50 min    Activity Tolerance Patient tolerated treatment well    Behavior During Therapy WFL for tasks assessed/performed               Past Medical History:  Diagnosis Date   Depression    Genital HSV    Migraine without aura 03/15/2021   Past Surgical History:  Procedure Laterality Date   WISDOM TOOTH EXTRACTION     Patient Active Problem List   Diagnosis Date Noted   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   Rh negative state in antepartum period 05/31/2022   History of HSV 05/29/2022   Encounter for supervision of normal pregnancy 05/25/2022    REFERRING DIAG: O99.891,M54.9 (ICD-10-CM) - Back pain affecting pregnancy in second trimester   THERAPY DIAG:  Back pain affecting pregnancy in second trimester  Muscle weakness (generalized)  Pelvic pain affecting pregnancy in second trimester, antepartum  Other muscle spasm  Difficulty in walking, not elsewhere classified  Unspecified lack of coordination  Abnormal posture  Rationale for Evaluation and Treatment Rehabilitation  PERTINENT HISTORY: H/O LBP and pelvic pain, left knee pain, 24 weeks and 1 day pregnant EDD: 12/20/22, depression  PRECAUTIONS: other, pregnant  SUBJECTIVE:                                                                                                                                                                                      SUBJECTIVE STATEMENT: Today is a good day. I did very well in and after my first aquatic session. I didn't hurt much at all for the rest  of that day.   PAIN:  Are you having pain? Yes: NPRS scale: 4-5/10 Pain location: low back, pelvis Pain description: sore Aggravating factors: sitting too long, standing too long Relieving factors: changing positions, being in the water    OBJECTIVE: (objective measures completed at initial evaluation unless otherwise dated)  DIAGNOSTIC FINDINGS:  N/A   SCREENING FOR RED FLAGS: Neg   COGNITION: Overall cognitive status: Within functional limits for tasks assessed  SENSATION: WFL   MUSCLE LENGTH: HS: mild tightness bil Piriformis:WNL   POSTURE: No Significant postural limitations   PALPATION: TTP in bil gluteals, lumbar   LUMBAR ROM: WNL, some tightness with left rotation 25% deficit   LOWER EXTREMITY ROM:   WNL   LOWER EXTREMITY MMT:  *pelvic pain unless noted otherwise   MMT Right eval Left eval  Hip flexion 4+ with pain right LB 5  Hip extension 5 5  Hip abduction 5* 4*r  Hip adduction 5 4+  Hip internal rotation      Hip external rotation      Knee flexion 5 4+  Knee extension 5 4+*  Ankle dorsiflexion 5 5   (Blank rows = not tested)   LUMBAR SPECIAL TESTS:  FABER test: Negative, negative SIJ sompression/distraction   FUNCTIONAL TESTS:  5 times sit to stand: 23.88 sec pelvic/low pain at 6/10   MODIFIED OSWESTRY: TBD     TODAY'S TREATMENT:      09/15/22:  Pt arrives for aquatic physical therapy. Treatment took place in 3.5-5.5 feet of water. Water temperature was 90 degrees F. Pt entered the pool via stairs independently but slowly and with moderate use of the rail. Pt requires buoyancy of water for support and to offload joints with strengthening exercises.   Seated water bench with 75% submersion Pt performed seated LE AROM exercises 20x in all planes, with concurrent discussion of status post first pool session and her current status. Seated decompression hang with yellow noodle, VC to relax pelvis 2 min. 75% submersion  water walking 6 lengths in each direction 1 min seated decompression hang rest in between yellow noodle used for postural support. Standing against wall: posterior pelvic tilt 5 sec hold 5x. Post tilt, hold and lat press with single buoy 2x5, marching 4 lengths across the pool holding large noodle. Seated decompression 5 min post with neck pillow for support.     DATE: 09/14/22 Nustep x 6 min level 5 (PT present to discuss goals and status) Seated hamstring stretch 3 x 30 sec each LE Seated hamstring with ITB emphasis 2x20 seconds bil  Thread the needle: x3 each side  Supine hip abduction with blue band: TA and pelvic floor activation  Supine ball squeeze with TA and pelvic floor activation 5" hold x10 Sidelying clam and reverse clam x10 each  Standing quad/hip flexor stretch 3 x 30 sec each LE                                                                                                                   DATE: 09/07/22 Nustep x 5 min level 5 (PT present to discuss goals and status) Standing hamstring stretch 3 x 30 sec each LE Standing quad/hip flexor stretch 3 x 30 sec each LE Supine IT band stretch with strap 3 x 30 sec each LE   DATE:    07/03/22: Pt arrives for aquatic physical therapy. Treatment took place in 3.5-5.5 feet of water. Water temperature was 90 degrees  F. Pt entered the pool via stairs independently but slowly and with moderate use of the rail. Pt requires buoyancy of water for support and to offload joints with strengthening exercises.   Seated water bench with 75% submersion Pt performed seated LE AROM exercises 20x in all planes, with concurrent education on water principles and how we would use them, verbal understanding.  Seated decompression hang with yellow noodle, VC to relax pelvis 2 min. 75% submersion water walking 4 lengths in each direction 1 min seated decompression hang rest in between yellow noodle used for postural support. Standing against wall: posterior  pelvic tilt 5 sec hold 5x. Verbally educated pt to use this stretch/exercise for pain management or if she finds she has to stand for awhile. Pt verbally understood.    07/02/22 See pt ed and HEP     PATIENT EDUCATION:  Education details: PT eval findings, anticipated POC, need for further assessment of pelvic pain, initial HEP, postural awareness, education on log rolling for bed mobility, and discussed acquiring a belly band for support.   Person educated: Patient Education method: Explanation, Demonstration, Verbal cues, and Handouts Education comprehension: verbalized understanding and returned demonstration   HOME EXERCISE PROGRAM:  Access Code: [email or web address] URL: [email or web address] Date: 09/07/2022 Prepared by: Mikey Kirschner  Exercises - Seated Transversus Abdominis Bracing  - 2 x daily - 7 x weekly - 2 sets - 10 reps - Seated Pelvic Tilt  - 2 x daily - 7 x weekly - 2 sets - 10 reps - Cat Cow  - 2 x daily - 7 x weekly - 2 sets - 10 reps - 3 hold - Standing Lumbar Extension  - 2 x daily - 7 x weekly - 2 sets - 10 reps - Reclined Diaphragmatic Breathing  - 1 x daily - 7 x weekly - 1 sets - 20 reps - 1 2 second hold - Standing Hamstring Stretch on Chair  - 1 x daily - 7 x weekly - 1 sets - 3 reps - 30 sec hold - Standing Quad Stretch with Table and Chair Support  - 1 x daily - 7 x weekly - 1 sets - 3 reps - 30 sec hold - Supine Hip Adduction Isometric with Ball  - 1 x daily - 7 x weekly - 1 sets - 10 reps - 10 sec hold - Hooklying Isometric Hip Abduction with Belt  - 1 x daily - 7 x weekly - 1 sets - 10 reps - 10 sec hold  ASSESSMENT:   CLINICAL IMPRESSION: Pt reports good results from aquatic PT treatment in regards to lasting pain reduction. Pt has found the posterior tilt has been useful at home and while working in reducing back discomfort after sitting. Added more challenging exercises today that she had no issue with.  OBJECTIVE IMPAIRMENTS: decreased activity  tolerance, decreased ROM, decreased strength, increased muscle spasms, impaired flexibility, and pain.    ACTIVITY LIMITATIONS: sitting, standing, transfers, and locomotion level   PARTICIPATION LIMITATIONS: occupation   PERSONAL FACTORS: Past/current experiences and 1-2 comorbidities: previous LB/pelvic pain and left knee pain  are also affecting patient's functional outcome.    REHAB POTENTIAL: Excellent   CLINICAL DECISION MAKING: Stable/uncomplicated   EVALUATION COMPLEXITY: Low     GOALS: Goals reviewed with patient? Yes   SHORT TERM GOALS: Target date: 09/14/2022   The patient will demonstrate knowledge of basic self care strategies and exercises to promote healing including log-rolling to get in/out of bed   Baseline: Goal  status:MET   2.  The patient will report a 30% improvement in pain levels with functional activities which are currently difficult including sit to stand, turning over in bed, getting in/out of car and walking. Baseline:  5% improvement (09/14/22) Goal status: INITIAL   3.  Patient to complete Modified Oswestry by 2nd land visit for baseline functional assessment. Baseline:  Goal status: INITIAL       LONG TERM GOALS: Target date: 10/26/2022   The patient will be independent in a safe self progression of a home exercise program to promote further recovery of function   Baseline:  Goal status: INITIAL   2. The patient will report a 75% improvement in pain levels with functional activities which are currently difficult including bed mobility, sit to stand, getting in/out of car and walking Baseline:  Goal status: INITIAL   3.  The patient will have improved hip flexor and extensor muscle strength to 5/5 needed for lifting medium weight objects and carrying her infant  Baseline:  Goal status: INITIAL   4.  Improved modified Oswestry score by 12% indicating improved function with less pain Baseline: TBD Goal status: INITIAL     PLAN:   PT  FREQUENCY: 2x/week   PT DURATION: 8 weeks   PLANNED INTERVENTIONS: Therapeutic exercises, Therapeutic activity, Neuromuscular re-education, Patient/Family education, Self Care, Joint mobilization, Aquatic Therapy, Spinal mobilization, Cryotherapy, Moist heat, Taping, and Manual therapy.   PLAN FOR NEXT SESSION: Pt will see pelvic floor therapist next  Ane Payment, PTA 09/15/22 2:24 PM

## 2022-09-18 ENCOUNTER — Ambulatory Visit: Payer: Commercial Managed Care - PPO | Admitting: Physical Therapy

## 2022-09-18 ENCOUNTER — Encounter: Payer: Commercial Managed Care - PPO | Admitting: Obstetrics & Gynecology

## 2022-09-18 DIAGNOSIS — R279 Unspecified lack of coordination: Secondary | ICD-10-CM

## 2022-09-18 DIAGNOSIS — O99891 Other specified diseases and conditions complicating pregnancy: Secondary | ICD-10-CM | POA: Diagnosis not present

## 2022-09-18 DIAGNOSIS — M62838 Other muscle spasm: Secondary | ICD-10-CM

## 2022-09-18 DIAGNOSIS — M549 Dorsalgia, unspecified: Secondary | ICD-10-CM

## 2022-09-18 DIAGNOSIS — R293 Abnormal posture: Secondary | ICD-10-CM

## 2022-09-18 DIAGNOSIS — M6281 Muscle weakness (generalized): Secondary | ICD-10-CM

## 2022-09-18 NOTE — Therapy (Signed)
OUTPATIENT PHYSICAL THERAPY TREATMENT NOTE   Patient Name: Monique Pruitt MRN: [id number] DOB:03-23-1991, 32 y.o., female Today's Date: 09/18/2022  PCP: None REFERRING PROVIDER: Lennart Pall, MD   END OF SESSION:   PT End of Session - 09/18/22 0803     Visit Number 6    Date for PT Re-Evaluation 10/26/22    Authorization Type [employer]    PT Start Time 0801    PT Stop Time 0843    PT Time Calculation (min) 42 min    Activity Tolerance Patient tolerated treatment well    Behavior During Therapy [facility] for tasks assessed/performed              Past Medical History:  Diagnosis Date   Depression    Genital HSV    Migraine without aura 03/15/2021   Past Surgical History:  Procedure Laterality Date   WISDOM TOOTH EXTRACTION     Patient Active Problem List   Diagnosis Date Noted   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   Rh negative state in antepartum period 05/31/2022   History of HSV 05/29/2022   Encounter for supervision of normal pregnancy 05/25/2022    REFERRING DIAG: O99.891,M54.9 (ICD-10-CM) - Back pain affecting pregnancy in second trimester   THERAPY DIAG:  Back pain affecting pregnancy in second trimester  Muscle weakness (generalized)  Other muscle spasm  Unspecified lack of coordination  Abnormal posture  Rationale for Evaluation and Treatment Rehabilitation  PERTINENT HISTORY: H/O LBP and pelvic pain, left knee pain, 24 weeks and 1 day pregnant EDD: 12/20/22, depression  PRECAUTIONS: other, pregnant  SUBJECTIVE:                                                                                                                                                                                      SUBJECTIVE STATEMENT:  pt states pain ranges 4-9/10, sometimes worse in the morning when waking and prolonged activity. Pool has been helping a lot    PAIN:  Are you having pain? Yes: NPRS scale: 6-7/10 Pain location: low back, pelvis Pain description:  sore Aggravating factors: sitting too long, standing too long Relieving factors: changing positions, being in the water    OBJECTIVE: (objective measures completed at initial evaluation unless otherwise dated)  DIAGNOSTIC FINDINGS:  N/A   SCREENING FOR RED FLAGS: Neg   COGNITION: Overall cognitive status: Within functional limits for tasks assessed                          SENSATION: WFL   MUSCLE LENGTH: HS: mild tightness bil Piriformis:WNL   POSTURE: No Significant postural limitations   PALPATION:  TTP in bil gluteals, lumbar   LUMBAR ROM: WNL, some tightness with left rotation 25% deficit   LOWER EXTREMITY ROM:   WNL   LOWER EXTREMITY MMT:  *pelvic pain unless noted otherwise   MMT Right eval Left eval  Hip flexion 4+ with pain right LB 5  Hip extension 5 5  Hip abduction 5* 4*r  Hip adduction 5 4+  Hip internal rotation      Hip external rotation      Knee flexion 5 4+  Knee extension 5 4+*  Ankle dorsiflexion 5 5   (Blank rows = not tested)   LUMBAR SPECIAL TESTS:  FABER test: Negative, negative SIJ sompression/distraction   FUNCTIONAL TESTS:  5 times sit to stand: 23.88 sec pelvic/low pain at 6/10   MODIFIED OSWESTRY: TBD     TODAY'S TREATMENT:            DATE: 09/18/22  Patient consented to internal pelvic floor treatment/assessment vaginally this date and found to have tension at bil pubococcygeus and Lt worse than Rt. Multiple trigger points noted at Lt and one at Rt, all released well but slight remaining tightness at Rt side after treatment. Pt benefited from cues for diaphragmatic breathing during treatment to decreased pain and tension. Post treatment pt reported significant release of muscle and reports she did not feel nearly as tight.  Pt denied allergies to adhesives/tape and agreed to taping at abdomen today for abdominal support and decreased strain at pelvic floor and less pain. Pt educated on how/when to remove tape and to remove with  any redness/itching/adverse reactions and pt agreed and verbalized understanding. X5 k-tape placed for support with 25% upward stretch. Pt reported improvement with this as well and reported no pain at end of session.  Pt also brought belly band- PT educated on donning technique to decreased strain of pt doing in standing, instead to attempt to lay on it and help support pubic symphysis approximation.    DATE: 09/14/22 Nustep x 6 min level 5 (PT present to discuss goals and status) Seated hamstring stretch 3 x 30 sec each LE Seated hamstring with ITB emphasis 2x20 seconds bil  Thread the needle: x3 each side  Supine hip abduction with blue band: TA and pelvic floor activation  Supine ball squeeze with TA and pelvic floor activation 5" hold x10 Sidelying clam and reverse clam x10 each  Standing quad/hip flexor stretch 3 x 30 sec each LE                                                                                                                   DATE: 09/07/22 Nustep x 5 min level 5 (PT present to discuss goals and status) Standing hamstring stretch 3 x 30 sec each LE Standing quad/hip flexor stretch 3 x 30 sec each LE Supine IT band stretch with strap 3 x 30 sec each LE     PATIENT EDUCATION:  Education details: PT eval findings, anticipated POC, need for further assessment of  pelvic pain, initial HEP, postural awareness, education on log rolling for bed mobility, and discussed acquiring a belly band for support.   Person educated: Patient Education method: Explanation, Demonstration, Verbal cues, and Handouts Education comprehension: verbalized understanding and returned demonstration   HOME EXERCISE PROGRAM:  Access Code: [email or web address] URL: [email or web address] Date: 09/07/2022 Prepared by: Mikey Kirschner  Exercises - Seated Transversus Abdominis Bracing  - 2 x daily - 7 x weekly - 2 sets - 10 reps - Seated Pelvic Tilt  - 2 x daily - 7 x weekly - 2 sets - 10 reps -  Cat Cow  - 2 x daily - 7 x weekly - 2 sets - 10 reps - 3 hold - Standing Lumbar Extension  - 2 x daily - 7 x weekly - 2 sets - 10 reps - Reclined Diaphragmatic Breathing  - 1 x daily - 7 x weekly - 1 sets - 20 reps - 1 2 second hold - Standing Hamstring Stretch on Chair  - 1 x daily - 7 x weekly - 1 sets - 3 reps - 30 sec hold - Standing Quad Stretch with Table and Chair Support  - 1 x daily - 7 x weekly - 1 sets - 3 reps - 30 sec hold - Supine Hip Adduction Isometric with Ball  - 1 x daily - 7 x weekly - 1 sets - 10 reps - 10 sec hold - Hooklying Isometric Hip Abduction with Belt  - 1 x daily - 7 x weekly - 1 sets - 10 reps - 10 sec hold  ASSESSMENT:   CLINICAL IMPRESSION: Pt session focused on internal vaginal pelvic floor manual work for decreased pain and tension at bil pubococcygeus and trigger point release. Pt tolerated well, reported improvement in symptoms post this treatment once returning to standing and moving around. Pt also educated on belly band use and agreed to taping at abdomen for support and decreased pain at pelvis. Pt reported feeling much better at end of session and pleased with improvement. Pt has pelvic wand at and reports she has been using this but not seen improvement. Pt educated on where tension felt internally and pt can clear with OBGYN and pt agreed. Patient will benefit from skilled PT to address the below impairments and improve overall function.    OBJECTIVE IMPAIRMENTS: decreased activity tolerance, decreased ROM, decreased strength, increased muscle spasms, impaired flexibility, and pain.    ACTIVITY LIMITATIONS: sitting, standing, transfers, and locomotion level   PARTICIPATION LIMITATIONS: occupation   PERSONAL FACTORS: Past/current experiences and 1-2 comorbidities: previous LB/pelvic pain and left knee pain  are also affecting patient's functional outcome.    REHAB POTENTIAL: Excellent   CLINICAL DECISION MAKING: Stable/uncomplicated   EVALUATION  COMPLEXITY: Low     GOALS: Goals reviewed with patient? Yes   SHORT TERM GOALS: Target date: 09/14/2022   The patient will demonstrate knowledge of basic self care strategies and exercises to promote healing including log-rolling to get in/out of bed   Baseline: Goal status:MET   2.  The patient will report a 30% improvement in pain levels with functional activities which are currently difficult including sit to stand, turning over in bed, getting in/out of car and walking. Baseline:  5% improvement (09/14/22) Goal status: INITIAL   3.  Patient to complete Modified Oswestry by 2nd land visit for baseline functional assessment. Baseline:  Goal status: INITIAL       LONG TERM GOALS: Target date: 10/26/2022   The patient will be  independent in a safe self progression of a home exercise program to promote further recovery of function   Baseline:  Goal status: INITIAL   2. The patient will report a 75% improvement in pain levels with functional activities which are currently difficult including bed mobility, sit to stand, getting in/out of car and walking Baseline:  Goal status: INITIAL   3.  The patient will have improved hip flexor and extensor muscle strength to 5/5 needed for lifting medium weight objects and carrying her infant  Baseline:  Goal status: INITIAL   4.  Improved modified Oswestry score by 12% indicating improved function with less pain Baseline: TBD Goal status: INITIAL     PLAN:   PT FREQUENCY: 2x/week   PT DURATION: 8 weeks   PLANNED INTERVENTIONS: Therapeutic exercises, Therapeutic activity, Neuromuscular re-education, Patient/Family education, Self Care, Joint mobilization, Aquatic Therapy, Spinal mobilization, Cryotherapy, Moist heat, Taping, and Manual therapy.   PLAN FOR NEXT SESSION: Pt will see pelvic floor therapist next   Otelia Sergeant, PT, DPT 09/17/2408:02 AM

## 2022-09-19 ENCOUNTER — Ambulatory Visit (INDEPENDENT_AMBULATORY_CARE_PROVIDER_SITE_OTHER): Payer: Commercial Managed Care - PPO | Admitting: Obstetrics and Gynecology

## 2022-09-19 VITALS — BP 111/77 | HR 94 | Wt 163.0 lb

## 2022-09-19 DIAGNOSIS — Z3490 Encounter for supervision of normal pregnancy, unspecified, unspecified trimester: Secondary | ICD-10-CM

## 2022-09-19 MED ORDER — RHO D IMMUNE GLOBULIN 1500 UNIT/2ML IJ SOSY: 300.0000 ug | PREFILLED_SYRINGE | Freq: Once | INTRAMUSCULAR | Status: DC

## 2022-09-19 NOTE — Progress Notes (Signed)
   PRENATAL VISIT NOTE  Subjective:  Monique Pruitt is a 32 y.o. G1P0000 at [redacted]w[redacted]d being seen today for ongoing prenatal care.  She is currently monitored for the following issues for this low-risk pregnancy and has Encounter for supervision of normal pregnancy; History of HSV; Rh negative state in antepartum period; and Choroid plexus cyst on their problem list.  Patient reports no complaints.  Contractions: Not present. Vag. Bleeding: None.  Movement: Present. Denies leaking of fluid.   The following portions of the patient's history were reviewed and updated as appropriate: allergies, current medications, past family history, past medical history, past social history, past surgical history and problem list.   Objective:   Vitals:   09/19/22 0813  BP: 111/77  Pulse: 94  Weight: 163 lb (73.9 kg)    Fetal Status: Fetal Heart Rate (bpm): 154 Fundal Height: 27 cm Movement: Present     General:  Alert, oriented and cooperative. Patient is in no acute distress.  Skin: Skin is warm and dry. No rash noted.   Cardiovascular: Normal heart rate noted  Respiratory: Normal respiratory effort, no problems with respiration noted  Abdomen: Soft, gravid, appropriate for gestational age.  Pain/Pressure: Absent     Pelvic: Cervical exam deferred        Extremities: Normal range of motion.  Edema: None  Mental Status: Normal mood and affect. Normal behavior. Normal judgment and thought content.   Assessment and Plan:  Pregnancy: G1P0000 at [redacted]w[redacted]d 1. Encounter for supervision of normal pregnancy, antepartum, unspecified gravidity  - Concern with needles- will do rhogam and TDAP at her next visit.  - Glucose Tolerance, 2 Hours w/1 Hour - Antibody screen - HIV antibody (with reflex) - CBC - RPR  -OB box updated   Preterm labor symptoms and general obstetric precautions including but not limited to vaginal bleeding, contractions, leaking of fluid and fetal movement were reviewed in detail  with the patient. Please refer to After Visit Summary for other counseling recommendations.   No follow-ups on file.  Future Appointments  Date Time Provider Department Center  09/19/2022 10:30 AM Chessa Barrasso, Harolyn Rutherford, NP [facility]  09/21/2022  8:00 AM Riddles, Enrique Sack, PT [facility] None  09/27/2022  8:00 AM Edrick Oh, PT [facility] None  09/28/2022 12:00 PM Carlson-Long, Lise Auer [facility]  10/04/2022  8:00 AM Manfred Shirts, PTA [facility] None  10/05/2022  8:00 AM Riddles, Enrique Sack, PT [facility] None  10/09/2022  8:10 AM Constant, Gigi Gin, MD [facility]  10/11/2022  8:00 AM Manfred Shirts, PTA [facility] None  10/12/2022  8:00 AM Riddles, Enrique Sack, PT [facility] None  10/18/2022  8:45 AM Manfred Shirts, PTA [facility] None  10/19/2022  8:00 AM Edrick Oh, PT [facility] None  10/19/2022  8:00 AM Barbaraann Faster, PT [facility] None  10/30/2022  8:10 AM Lennart Pall, MD [facility] [place] [place]    Venia Carbon, NP

## 2022-09-20 LAB — ANTIBODY SCREEN: Antibody Screen: NEGATIVE

## 2022-09-20 LAB — CBC
Hematocrit: 35.3 % (ref 34.0–46.6)
Hemoglobin: 11.8 g/dL (ref 11.1–15.9)
MCH: 30.3 pg (ref 26.6–33.0)
MCHC: 33.4 g/dL (ref 31.5–35.7)
MCV: 91 fL (ref 79–97)
Platelets: 170 10*3/uL (ref 150–450)
RBC: 3.9 x10E6/uL (ref 3.77–5.28)
RDW: 12.6 % (ref 11.7–15.4)
WBC: 8.2 10*3/uL (ref 3.4–10.8)

## 2022-09-20 LAB — GLUCOSE TOLERANCE, 2 HOURS W/ 1HR
Glucose, 1 hour: 145 mg/dL (ref 70–179)
Glucose, 2 hour: 134 mg/dL (ref 70–152)
Glucose, Fasting: 75 mg/dL (ref 70–91)

## 2022-09-20 LAB — RPR: RPR Ser Ql: NONREACTIVE

## 2022-09-20 LAB — HIV ANTIBODY (ROUTINE TESTING W REFLEX): HIV Screen 4th Generation wRfx: NONREACTIVE

## 2022-09-21 ENCOUNTER — Ambulatory Visit: Payer: Commercial Managed Care - PPO

## 2022-09-21 DIAGNOSIS — M62838 Other muscle spasm: Secondary | ICD-10-CM

## 2022-09-21 DIAGNOSIS — M6281 Muscle weakness (generalized): Secondary | ICD-10-CM

## 2022-09-21 DIAGNOSIS — O99891 Other specified diseases and conditions complicating pregnancy: Secondary | ICD-10-CM | POA: Diagnosis not present

## 2022-09-21 NOTE — Therapy (Signed)
OUTPATIENT PHYSICAL THERAPY TREATMENT NOTE   Patient Name: Monique Pruitt MRN: [id number] DOB:Jan 01, 1991, 32 y.o., female Today's Date: 09/21/2022  PCP: None REFERRING PROVIDER: Lennart Pall, MD   END OF SESSION:   PT End of Session - 09/21/22 0842     Visit Number 7    Date for PT Re-Evaluation 10/26/22    Authorization Type [employer]    PT Start Time 0801    PT Stop Time 0842    PT Time Calculation (min) 41 min    Activity Tolerance Patient tolerated treatment well    Behavior During Therapy [facility] for tasks assessed/performed               Past Medical History:  Diagnosis Date   Depression    Genital HSV    Migraine without aura 03/15/2021   Past Surgical History:  Procedure Laterality Date   WISDOM TOOTH EXTRACTION     Patient Active Problem List   Diagnosis Date Noted   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   Rh negative state in antepartum period 05/31/2022   History of HSV 05/29/2022   Encounter for supervision of normal pregnancy 05/25/2022    REFERRING DIAG: O99.891,M54.9 (ICD-10-CM) - Back pain affecting pregnancy in second trimester   THERAPY DIAG:  Back pain affecting pregnancy in second trimester  Muscle weakness (generalized)  Other muscle spasm  Rationale for Evaluation and Treatment Rehabilitation  PERTINENT HISTORY: H/O LBP and pelvic pain, left knee pain, 24 weeks and 1 day pregnant EDD: 12/20/22, depression  PRECAUTIONS: other, pregnant  SUBJECTIVE:                                                                                                                                                                                      SUBJECTIVE STATEMENT:  Last treatment helped me more than anything has. I felt really good.    PAIN:  Are you having pain? Yes: NPRS scale: 4/10 Pain location: low back, pelvis Pain description: sore Aggravating factors: sitting too long, standing too long Relieving factors: changing positions, being in  the water    OBJECTIVE: (objective measures completed at initial evaluation unless otherwise dated)  DIAGNOSTIC FINDINGS:  N/A   SCREENING FOR RED FLAGS: Neg   COGNITION: Overall cognitive status: Within functional limits for tasks assessed                          SENSATION: WFL   MUSCLE LENGTH: HS: mild tightness bil Piriformis:WNL   POSTURE: No Significant postural limitations   PALPATION: TTP in bil gluteals, lumbar   LUMBAR ROM: WNL, some tightness with left rotation 25%  deficit   LOWER EXTREMITY ROM:   WNL   LOWER EXTREMITY MMT:  *pelvic pain unless noted otherwise   MMT Right eval Left eval  Hip flexion 4+ with pain right LB 5  Hip extension 5 5  Hip abduction 5* 4*r  Hip adduction 5 4+  Hip internal rotation      Hip external rotation      Knee flexion 5 4+  Knee extension 5 4+*  Ankle dorsiflexion 5 5   (Blank rows = not tested)   LUMBAR SPECIAL TESTS:  FABER test: Negative, negative SIJ sompression/distraction   FUNCTIONAL TESTS:  5 times sit to stand: 23.88 sec pelvic/low pain at 6/10   MODIFIED OSWESTRY: TBD     TODAY'S TREATMENT:          DATE: 09/21/22 Nustep x 6 min level 5 (PT present to discuss goals and status) Standing hip flexor stretch at steps 3x20 seconds bil Seated on ball: pelvic tilt and circles x 20 each   Pelvic floor PT: 09/21/22  Manual: Patient consented to internal pelvic floor treatment vaginally this date and found to continued tension throughout Lt pelvic floor superficial and deep layers however Lt obturator internus most tension with multiple trigger points. All released well, mild tightness remaining however pt reported greatly improved pain levels. Rt obturator also had small trigger point which released well with manual work. Gentle stretching completed with all areas of tension more so deeper layers and pt tolerated well. Pt reported feeling better with manual work today, and felt more mobile.  Otelia Sergeant, PT,  DPT 05/16/249:40 AM    DATE: 09/18/22  Patient consented to internal pelvic floor treatment/assessment vaginally this date and found to have tension at bil pubococcygeus and Lt worse than Rt. Multiple trigger points noted at Lt and one at Rt, all released well but slight remaining tightness at Rt side after treatment. Pt benefited from cues for diaphragmatic breathing during treatment to decreased pain and tension. Post treatment pt reported significant release of muscle and reports she did not feel nearly as tight.  Pt denied allergies to adhesives/tape and agreed to taping at abdomen today for abdominal support and decreased strain at pelvic floor and less pain. Pt educated on how/when to remove tape and to remove with any redness/itching/adverse reactions and pt agreed and verbalized understanding. X5 k-tape placed for support with 25% upward stretch. Pt reported improvement with this as well and reported no pain at end of session.  Pt also brought belly band- PT educated on donning technique to decreased strain of pt doing in standing, instead to attempt to lay on it and help support pubic symphysis approximation.    DATE: 09/14/22 Nustep x 6 min level 5 (PT present to discuss goals and status) Seated hamstring stretch 3 x 30 sec each LE Seated hamstring with ITB emphasis 2x20 seconds bil  Thread the needle: x3 each side  Supine hip abduction with blue band: TA and pelvic floor activation  Supine ball squeeze with TA and pelvic floor activation 5" hold x10 Sidelying clam and reverse clam x10 each  Standing quad/hip flexor stretch 3 x 30 sec each LE  DATE: 09/07/22 Nustep x 5 min level 5 (PT present to discuss goals and status) Standing hamstring stretch 3 x 30 sec each LE Standing quad/hip flexor stretch 3 x 30 sec each LE Supine IT band stretch with strap 3 x 30 sec each LE      PATIENT EDUCATION:  Education details: PT eval findings, anticipated POC, need for further assessment of pelvic pain, initial HEP, postural awareness, education on log rolling for bed mobility, and discussed acquiring a belly band for support.   Person educated: Patient Education method: Explanation, Demonstration, Verbal cues, and Handouts Education comprehension: verbalized understanding and returned demonstration   HOME EXERCISE PROGRAM:  Access Code: [email or web address] URL: [email or web address] Date: 09/07/2022 Prepared by: Mikey Kirschner  Exercises - Seated Transversus Abdominis Bracing  - 2 x daily - 7 x weekly - 2 sets - 10 reps - Seated Pelvic Tilt  - 2 x daily - 7 x weekly - 2 sets - 10 reps - Cat Cow  - 2 x daily - 7 x weekly - 2 sets - 10 reps - 3 hold - Standing Lumbar Extension  - 2 x daily - 7 x weekly - 2 sets - 10 reps - Reclined Diaphragmatic Breathing  - 1 x daily - 7 x weekly - 1 sets - 20 reps - 1 2 second hold - Standing Hamstring Stretch on Chair  - 1 x daily - 7 x weekly - 1 sets - 3 reps - 30 sec hold - Standing Quad Stretch with Table and Chair Support  - 1 x daily - 7 x weekly - 1 sets - 3 reps - 30 sec hold - Supine Hip Adduction Isometric with Ball  - 1 x daily - 7 x weekly - 1 sets - 10 reps - 10 sec hold - Hooklying Isometric Hip Abduction with Belt  - 1 x daily - 7 x weekly - 1 sets - 10 reps - 10 sec hold  ASSESSMENT:   CLINICAL IMPRESSION: Pt reports 40% reduction in symptoms since the start of care.  She has increased ease with rolling in bed and car transfers.  Pt reports that she felt the best she has ever felt after last session and was pain free x 2 weeks.  Pt responded well to manual therapy performed by pelvic PT and had tension in pelvic floor and responded well to manual trigger point release. Patient will benefit from skilled PT to address the below impairments and improve overall function.    OBJECTIVE IMPAIRMENTS: decreased activity  tolerance, decreased ROM, decreased strength, increased muscle spasms, impaired flexibility, and pain.    ACTIVITY LIMITATIONS: sitting, standing, transfers, and locomotion level   PARTICIPATION LIMITATIONS: occupation   PERSONAL FACTORS: Past/current experiences and 1-2 comorbidities: previous LB/pelvic pain and left knee pain  are also affecting patient's functional outcome.    REHAB POTENTIAL: Excellent   CLINICAL DECISION MAKING: Stable/uncomplicated   EVALUATION COMPLEXITY: Low     GOALS: Goals reviewed with patient? Yes   SHORT TERM GOALS: Target date: 09/14/2022   The patient will demonstrate knowledge of basic self care strategies and exercises to promote healing including log-rolling to get in/out of bed   Baseline: Goal status:MET   2.  The patient will report a 30% improvement in pain levels with functional activities which are currently difficult including sit to stand, turning over in bed, getting in/out of car and walking. Baseline: 40% better (09/21/22) Goal status: MET    3.  Patient to complete Modified Oswestry  by 2nd land visit for baseline functional assessment. Baseline:  Goal status: INITIAL       LONG TERM GOALS: Target date: 10/26/2022   The patient will be independent in a safe self progression of a home exercise program to promote further recovery of function   Baseline:  Goal status: INITIAL   2. The patient will report a 75% improvement in pain levels with functional activities which are currently difficult including bed mobility, sit to stand, getting in/out of car and walking Baseline:  Goal status: INITIAL   3.  The patient will have improved hip flexor and extensor muscle strength to 5/5 needed for lifting medium weight objects and carrying her infant  Baseline:  Goal status: INITIAL   4.  Improved modified Oswestry score by 12% indicating improved function with less pain Baseline: TBD Goal status: INITIAL     PLAN:   PT FREQUENCY:  2x/week   PT DURATION: 8 weeks   PLANNED INTERVENTIONS: Therapeutic exercises, Therapeutic activity, Neuromuscular re-education, Patient/Family education, Self Care, Joint mobilization, Aquatic Therapy, Spinal mobilization, Cryotherapy, Moist heat, Taping, and Manual therapy.   PLAN FOR NEXT SESSION: core and hip strength, flexibility, manual to release pelvic trigger points    Lorrene Reid, PT 09/21/22 9:40 AM

## 2022-09-25 ENCOUNTER — Encounter: Payer: Commercial Managed Care - PPO | Admitting: Obstetrics & Gynecology

## 2022-09-27 ENCOUNTER — Ambulatory Visit: Payer: Commercial Managed Care - PPO

## 2022-09-27 DIAGNOSIS — M62838 Other muscle spasm: Secondary | ICD-10-CM

## 2022-09-27 DIAGNOSIS — M549 Dorsalgia, unspecified: Secondary | ICD-10-CM

## 2022-09-27 DIAGNOSIS — M6281 Muscle weakness (generalized): Secondary | ICD-10-CM

## 2022-09-27 DIAGNOSIS — O99891 Other specified diseases and conditions complicating pregnancy: Secondary | ICD-10-CM | POA: Diagnosis not present

## 2022-09-27 NOTE — Therapy (Signed)
OUTPATIENT PHYSICAL THERAPY TREATMENT NOTE   Patient Name: Monique Pruitt MRN: [id number] DOB:1990-06-15, 32 y.o., female Today's Date: 09/27/2022  PCP: None REFERRING PROVIDER: Lennart Pall, MD   END OF SESSION:   PT End of Session - 09/27/22 0847     Visit Number 8    Date for PT Re-Evaluation 10/26/22    Authorization Type [employer]    PT Start Time 0801    PT Stop Time 0842    PT Time Calculation (min) 41 min    Activity Tolerance Patient tolerated treatment well    Behavior During Therapy [facility] for tasks assessed/performed                Past Medical History:  Diagnosis Date   Depression    Genital HSV    Migraine without aura 03/15/2021   Past Surgical History:  Procedure Laterality Date   WISDOM TOOTH EXTRACTION     Patient Active Problem List   Diagnosis Date Noted   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   Rh negative state in antepartum period 05/31/2022   History of HSV 05/29/2022   Encounter for supervision of normal pregnancy 05/25/2022    REFERRING DIAG: O99.891,M54.9 (ICD-10-CM) - Back pain affecting pregnancy in second trimester   THERAPY DIAG:  Back pain affecting pregnancy in second trimester  Muscle weakness (generalized)  Other muscle spasm  Rationale for Evaluation and Treatment Rehabilitation  PERTINENT HISTORY: H/O LBP and pelvic pain, left knee pain, 24 weeks and 1 day pregnant EDD: 12/20/22, depression  PRECAUTIONS: other, pregnant  SUBJECTIVE:                                                                                                                                                                                      SUBJECTIVE STATEMENT:  I rode in the car last weekend and this increased my pain.  Last treatment really helped.    PAIN:  Are you having pain? Yes: NPRS scale: 5/10 Pain location: low back, pelvis Pain description: sore Aggravating factors: sitting too long, standing too long Relieving factors:  changing positions, being in the water    OBJECTIVE: (objective measures completed at initial evaluation unless otherwise dated)  DIAGNOSTIC FINDINGS:  N/A   SCREENING FOR RED FLAGS: Neg   COGNITION: Overall cognitive status: Within functional limits for tasks assessed                          SENSATION: WFL   MUSCLE LENGTH: HS: mild tightness bil Piriformis:WNL   POSTURE: No Significant postural limitations   PALPATION: TTP in bil gluteals, lumbar   LUMBAR ROM: WNL,  some tightness with left rotation 25% deficit   LOWER EXTREMITY ROM:   WNL   LOWER EXTREMITY MMT:  *pelvic pain unless noted otherwise   MMT Right eval Left eval  Hip flexion 4+ with pain right LB 5  Hip extension 5 5  Hip abduction 5* 4*r  Hip adduction 5 4+  Hip internal rotation      Hip external rotation      Knee flexion 5 4+  Knee extension 5 4+*  Ankle dorsiflexion 5 5   (Blank rows = not tested)   LUMBAR SPECIAL TESTS:  FABER test: Negative, negative SIJ sompression/distraction   FUNCTIONAL TESTS:  5 times sit to stand: 23.88 sec pelvic/low pain at 6/10   MODIFIED OSWESTRY: TBD     TODAY'S TREATMENT:        DATE: 09/27/22 Nustep x 6 min level 5 (PT present to discuss goals and status) Standing hip flexor stretch at steps 3x20 seconds bil Seated on ball: pelvic tilt and circles x 20 each Seated figure 4 3x20 seconds  Trigger Point Dry-Needling  Treatment instructions: Expect mild to moderate muscle soreness. S/S of pneumothorax if dry needled over a lung field, and to seek immediate medical attention should they occur. Patient verbalized understanding of these instructions and education.  Patient Consent Given: Yes Education handout provided: Yes Muscles treated: Lt and Rt gluteals  Treatment response/outcome: Utilized skilled palpation to identify trigger points.  During dry needling able to palpate muscle twitch and muscle elongation  Elongation and release to Lt and Rt gluteals  after needling  Skilled palpation and monitoring by PT during dry needling      DATE: 09/21/22 Nustep x 6 min level 5 (PT present to discuss goals and status) Standing hip flexor stretch at steps 3x20 seconds bil Seated on ball: pelvic tilt and circles x 20 each   Pelvic floor PT: 09/21/22  Manual: Patient consented to internal pelvic floor treatment vaginally this date and found to continued tension throughout Lt pelvic floor superficial and deep layers however Lt obturator internus most tension with multiple trigger points. All released well, mild tightness remaining however pt reported greatly improved pain levels. Rt obturator also had small trigger point which released well with manual work. Gentle stretching completed with all areas of tension more so deeper layers and pt tolerated well. Pt reported feeling better with manual work today, and felt more mobile.  Otelia Sergeant, PT, DPT 05/22/248:49 AM    DATE: 09/18/22  Patient consented to internal pelvic floor treatment/assessment vaginally this date and found to have tension at bil pubococcygeus and Lt worse than Rt. Multiple trigger points noted at Lt and one at Rt, all released well but slight remaining tightness at Rt side after treatment. Pt benefited from cues for diaphragmatic breathing during treatment to decreased pain and tension. Post treatment pt reported significant release of muscle and reports she did not feel nearly as tight.  Pt denied allergies to adhesives/tape and agreed to taping at abdomen today for abdominal support and decreased strain at pelvic floor and less pain. Pt educated on how/when to remove tape and to remove with any redness/itching/adverse reactions and pt agreed and verbalized understanding. X5 k-tape placed for support with 25% upward stretch. Pt reported improvement with this as well and reported no pain at end of session.  Pt also brought belly band- PT educated on donning technique to decreased strain of  pt doing in standing, instead to attempt to lay on it and help  support pubic symphysis approximation.                                                                                                                     PATIENT EDUCATION:  Education details: PT eval findings, anticipated POC, need for further assessment of pelvic pain, initial HEP, postural awareness, education on log rolling for bed mobility, and discussed acquiring a belly band for support.   Person educated: Patient Education method: Explanation, Demonstration, Verbal cues, and Handouts Education comprehension: verbalized understanding and returned demonstration   HOME EXERCISE PROGRAM:  Access Code: [email or web address] URL: [email or web address] Date: 09/27/2022 Prepared by: Tresa Endo  Exercises - Seated Transversus Abdominis Bracing  - 2 x daily - 7 x weekly - 2 sets - 10 reps - Seated Pelvic Tilt  - 2 x daily - 7 x weekly - 2 sets - 10 reps - Cat Cow  - 2 x daily - 7 x weekly - 2 sets - 10 reps - 3 hold - Standing Lumbar Extension  - 2 x daily - 7 x weekly - 2 sets - 10 reps - Reclined Diaphragmatic Breathing  - 1 x daily - 7 x weekly - 1 sets - 20 reps - 1 2 second hold - Standing Hamstring Stretch on Chair  - 1 x daily - 7 x weekly - 1 sets - 3 reps - 30 sec hold - Standing Quad Stretch with Table and Chair Support  - 1 x daily - 7 x weekly - 1 sets - 3 reps - 30 sec hold - Supine Hip Adduction Isometric with Ball  - 1 x daily - 7 x weekly - 1 sets - 10 reps - 10 sec hold - Hooklying Isometric Hip Abduction with Belt  - 1 x daily - 7 x weekly - 1 sets - 10 reps - 10 sec hold - Seated Figure 4 Piriformis Stretch  - 3 x daily - 7 x weekly - 1 sets - 3 reps - 20 hold - Hip Flexor Stretch on Step  - 3 x daily - 7 x weekly - 1 sets - 3 reps - 20 hold  ASSESSMENT:   CLINICAL IMPRESSION: Pt reports 40% reduction in symptoms since the start of care.  She has increased ease with rolling in bed and car transfers.  Pt  responds well to internal pelvic treatment due to tension. Pt with increased pain today due to riding in the car.  Pt with good response to DN to Lt gluteals and had twitch and improved tissue mobility after.  Patient will benefit from skilled PT to address the below impairments and improve overall function.    OBJECTIVE IMPAIRMENTS: decreased activity tolerance, decreased ROM, decreased strength, increased muscle spasms, impaired flexibility, and pain.    ACTIVITY LIMITATIONS: sitting, standing, transfers, and locomotion level   PARTICIPATION LIMITATIONS: occupation   PERSONAL FACTORS: Past/current experiences and 1-2 comorbidities: previous LB/pelvic pain and left knee pain  are also affecting patient's functional outcome.    REHAB POTENTIAL: Excellent  CLINICAL DECISION MAKING: Stable/uncomplicated   EVALUATION COMPLEXITY: Low     GOALS: Goals reviewed with patient? Yes   SHORT TERM GOALS: Target date: 09/14/2022   The patient will demonstrate knowledge of basic self care strategies and exercises to promote healing including log-rolling to get in/out of bed   Baseline: Goal status:MET   2.  The patient will report a 30% improvement in pain levels with functional activities which are currently difficult including sit to stand, turning over in bed, getting in/out of car and walking. Baseline: 40% better (09/21/22) Goal status: MET    3.  Patient to complete Modified Oswestry by 2nd land visit for baseline functional assessment. Baseline:  Goal status: INITIAL       LONG TERM GOALS: Target date: 10/26/2022   The patient will be independent in a safe self progression of a home exercise program to promote further recovery of function   Baseline:  Goal status: INITIAL   2. The patient will report a 75% improvement in pain levels with functional activities which are currently difficult including bed mobility, sit to stand, getting in/out of car and walking Baseline:  Goal status:  INITIAL   3.  The patient will have improved hip flexor and extensor muscle strength to 5/5 needed for lifting medium weight objects and carrying her infant  Baseline:  Goal status: INITIAL   4.  Improved modified Oswestry score by 12% indicating improved function with less pain Baseline: TBD Goal status: INITIAL     PLAN:   PT FREQUENCY: 2x/week   PT DURATION: 8 weeks   PLANNED INTERVENTIONS: Therapeutic exercises, Therapeutic activity, Neuromuscular re-education, Patient/Family education, Self Care, Joint mobilization, Aquatic Therapy, Spinal mobilization, Cryotherapy, Moist heat, Taping, and Manual therapy.   PLAN FOR NEXT SESSION: see how pt responds to DN to gluteals, core and hip strength and flexibility, manual therapy as needed   Lorrene Reid, PT 09/27/22 8:49 AM

## 2022-09-27 NOTE — Patient Instructions (Signed)

## 2022-09-28 ENCOUNTER — Encounter (HOSPITAL_BASED_OUTPATIENT_CLINIC_OR_DEPARTMENT_OTHER): Payer: Self-pay

## 2022-09-28 ENCOUNTER — Ambulatory Visit (HOSPITAL_BASED_OUTPATIENT_CLINIC_OR_DEPARTMENT_OTHER): Payer: Commercial Managed Care - PPO | Admitting: Physical Therapy

## 2022-10-03 NOTE — Therapy (Unsigned)
OUTPATIENT PHYSICAL THERAPY TREATMENT NOTE   Patient Name: Monique Pruitt MRN: [id number] DOB:1991-02-15, 32 y.o., female Today's Date: 10/04/2022  PCP: None REFERRING PROVIDER: Lennart Pall, MD   END OF SESSION:   PT End of Session - 10/04/22 0802     Visit Number 9    Date for PT Re-Evaluation 10/26/22    Authorization Type [employer]    PT Start Time 0801    PT Stop Time 0850    PT Time Calculation (min) 49 min    Activity Tolerance Patient tolerated treatment well    Behavior During Therapy [facility] for tasks assessed/performed                Past Medical History:  Diagnosis Date   Depression    Genital HSV    Migraine without aura 03/15/2021   Past Surgical History:  Procedure Laterality Date   WISDOM TOOTH EXTRACTION     Patient Active Problem List   Diagnosis Date Noted   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   Rh negative state in antepartum period 05/31/2022   History of HSV 05/29/2022   Encounter for supervision of normal pregnancy 05/25/2022    REFERRING DIAG: O99.891,M54.9 (ICD-10-CM) - Back pain affecting pregnancy in second trimester   THERAPY DIAG:  Back pain affecting pregnancy in second trimester  Muscle weakness (generalized)  Unspecified lack of coordination  Other muscle spasm  Abnormal posture  Rationale for Evaluation and Treatment Rehabilitation  PERTINENT HISTORY: H/O LBP and pelvic pain, left knee pain, 24 weeks and 1 day pregnant EDD: 12/20/22, depression  PRECAUTIONS: other, pregnant  SUBJECTIVE:                                                                                                                                                                                      SUBJECTIVE STATEMENT:  Kind of a rough week pain wise. I can feel shifts in stomach and pelvis going into the third trimester.  PAIN:  Are you having pain? Yes: NPRS scale: 5/10 Pain location: low back, pelvis Pain description: sore Aggravating  factors: sitting too long, standing too long Relieving factors: changing positions, being in the water    OBJECTIVE: (objective measures completed at initial evaluation unless otherwise dated)  DIAGNOSTIC FINDINGS:  N/A   SCREENING FOR RED FLAGS: Neg   COGNITION: Overall cognitive status: Within functional limits for tasks assessed                          SENSATION: WFL   MUSCLE LENGTH: HS: mild tightness bil Piriformis:WNL   POSTURE: No Significant postural limitations   PALPATION: TTP  in bil gluteals, lumbar   LUMBAR ROM: WNL, some tightness with left rotation 25% deficit   LOWER EXTREMITY ROM:   WNL   LOWER EXTREMITY MMT:  *pelvic pain unless noted otherwise   MMT Right eval Left eval  Hip flexion 4+ with pain right LB 5  Hip extension 5 5  Hip abduction 5* 4*r  Hip adduction 5 4+  Hip internal rotation      Hip external rotation      Knee flexion 5 4+  Knee extension 5 4+*  Ankle dorsiflexion 5 5   (Blank rows = not tested)   LUMBAR SPECIAL TESTS:  FABER test: Negative, negative SIJ sompression/distraction   FUNCTIONAL TESTS:  5 times sit to stand: 23.88 sec pelvic/low pain at 6/10   MODIFIED OSWESTRY: TBD     TODAY'S TREATMENT:     10/04/22:Pt arrives for aquatic physical therapy. Treatment took place in 3.5-5.5 feet of water. Water temperature was 90 degrees F. Pt entered the pool via stairs independently with use of hand rails.Seated water bench with 75% submersion Pt performed seated LE AROM exercises 20x in all planes, and concurrent discussion of current status and pain.Pt requires buoyancy of water for support and to offload joints with strengthening exercises.   75% depth water walking with small noodle 10x each direction with intermittent rest breaks, Core/lat press against wall while performing  first term labor breathing. Seated compression hang 2 min. Hip AROM 10x in ea direction Bil mainly for controlled AROM. Posterior pelvic tilt against  wall 5 sec hold 10x using first stage labor breathing.   5 min vertical  decompression to end session.       DATE: 09/27/22 Nustep x 6 min level 5 (PT present to discuss goals and status) Standing hip flexor stretch at steps 3x20 seconds bil Seated on ball: pelvic tilt and circles x 20 each Seated figure 4 3x20 seconds  Trigger Point Dry-Needling  Treatment instructions: Expect mild to moderate muscle soreness. S/S of pneumothorax if dry needled over a lung field, and to seek immediate medical attention should they occur. Patient verbalized understanding of these instructions and education.  Patient Consent Given: Yes Education handout provided: Yes Muscles treated: Lt and Rt gluteals  Treatment response/outcome: Utilized skilled palpation to identify trigger points.  During dry needling able to palpate muscle twitch and muscle elongation  Elongation and release to Lt and Rt gluteals after needling  Skilled palpation and monitoring by PT during dry needling      DATE: 09/21/22 Nustep x 6 min level 5 (PT present to discuss goals and status) Standing hip flexor stretch at steps 3x20 seconds bil Seated on ball: pelvic tilt and circles x 20 each   Pelvic floor PT: 09/21/22  Manual: Patient consented to internal pelvic floor treatment vaginally this date and found to continued tension throughout Lt pelvic floor superficial and deep layers however Lt obturator internus most tension with multiple trigger points. All released well, mild tightness remaining however pt reported greatly improved pain levels. Rt obturator also had small trigger point which released well with manual work. Gentle stretching completed with all areas of tension more so deeper layers and pt tolerated well. Pt reported feeling better with manual work today, and felt more mobile.  Otelia Sergeant, PT, DPT 05/29/249:41 AM  PATIENT EDUCATION:  Education details: PT eval findings, anticipated POC, need for further assessment of pelvic pain, initial HEP, postural awareness, education on log rolling for bed mobility, and discussed acquiring a belly band for support.   Person educated: Patient Education method: Explanation, Demonstration, Verbal cues, and Handouts Education comprehension: verbalized understanding and returned demonstration   HOME EXERCISE PROGRAM:  Access Code: [email or web address] URL: [email or web address] Date: 09/27/2022 Prepared by: Tresa Endo  Exercises - Seated Transversus Abdominis Bracing  - 2 x daily - 7 x weekly - 2 sets - 10 reps - Seated Pelvic Tilt  - 2 x daily - 7 x weekly - 2 sets - 10 reps - Cat Cow  - 2 x daily - 7 x weekly - 2 sets - 10 reps - 3 hold - Standing Lumbar Extension  - 2 x daily - 7 x weekly - 2 sets - 10 reps - Reclined Diaphragmatic Breathing  - 1 x daily - 7 x weekly - 1 sets - 20 reps - 1 2 second hold - Standing Hamstring Stretch on Chair  - 1 x daily - 7 x weekly - 1 sets - 3 reps - 30 sec hold - Standing Quad Stretch with Table and Chair Support  - 1 x daily - 7 x weekly - 1 sets - 3 reps - 30 sec hold - Supine Hip Adduction Isometric with Ball  - 1 x daily - 7 x weekly - 1 sets - 10 reps - 10 sec hold - Hooklying Isometric Hip Abduction with Belt  - 1 x daily - 7 x weekly - 1 sets - 10 reps - 10 sec hold - Seated Figure 4 Piriformis Stretch  - 3 x daily - 7 x weekly - 1 sets - 3 reps - 20 hold - Hip Flexor Stretch on Step  - 3 x daily - 7 x weekly - 1 sets - 3 reps - 20 hold  ASSESSMENT:   CLINICAL IMPRESSION: Patient arrives with moderate pain to aquatic PT. All pain abolishes when submerged in the water and pt can float in a vertical position. PTA encouraged first term labor breathing with all core exercises today.  OBJECTIVE IMPAIRMENTS: decreased activity tolerance, decreased ROM, decreased strength, increased muscle spasms, impaired flexibility,  and pain.    ACTIVITY LIMITATIONS: sitting, standing, transfers, and locomotion level   PARTICIPATION LIMITATIONS: occupation   PERSONAL FACTORS: Past/current experiences and 1-2 comorbidities: previous LB/pelvic pain and left knee pain  are also affecting patient's functional outcome.    REHAB POTENTIAL: Excellent   CLINICAL DECISION MAKING: Stable/uncomplicated   EVALUATION COMPLEXITY: Low     GOALS: Goals reviewed with patient? Yes   SHORT TERM GOALS: Target date: 09/14/2022   The patient will demonstrate knowledge of basic self care strategies and exercises to promote healing including log-rolling to get in/out of bed   Baseline: Goal status:MET   2.  The patient will report a 30% improvement in pain levels with functional activities which are currently difficult including sit to stand, turning over in bed, getting in/out of car and walking. Baseline: 40% better (09/21/22) Goal status: MET    3.  Patient to complete Modified Oswestry by 2nd land visit for baseline functional assessment. Baseline:  Goal status: INITIAL       LONG TERM GOALS: Target date: 10/26/2022   The patient will be independent in a safe self progression of a home exercise program to promote further recovery of function   Baseline:  Goal status: INITIAL  2. The patient will report a 75% improvement in pain levels with functional activities which are currently difficult including bed mobility, sit to stand, getting in/out of car and walking Baseline:  Goal status: INITIAL   3.  The patient will have improved hip flexor and extensor muscle strength to 5/5 needed for lifting medium weight objects and carrying her infant  Baseline:  Goal status: INITIAL   4.  Improved modified Oswestry score by 12% indicating improved function with less pain Baseline: TBD Goal status: INITIAL     PLAN:   PT FREQUENCY: 2x/week   PT DURATION: 8 weeks   PLANNED INTERVENTIONS: Therapeutic exercises, Therapeutic  activity, Neuromuscular re-education, Patient/Family education, Self Care, Joint mobilization, Aquatic Therapy, Spinal mobilization, Cryotherapy, Moist heat, Taping, and Manual therapy.   PLAN FOR NEXT SESSION: see how pt responds to DN to gluteals, core and hip strength and flexibility, manual therapy as needed   Ane Payment, PTA 10/04/22 9:41 AM

## 2022-10-04 ENCOUNTER — Encounter: Payer: Self-pay | Admitting: Physical Therapy

## 2022-10-04 ENCOUNTER — Ambulatory Visit: Payer: Commercial Managed Care - PPO | Admitting: Physical Therapy

## 2022-10-04 DIAGNOSIS — M62838 Other muscle spasm: Secondary | ICD-10-CM

## 2022-10-04 DIAGNOSIS — M549 Dorsalgia, unspecified: Secondary | ICD-10-CM

## 2022-10-04 DIAGNOSIS — O99891 Other specified diseases and conditions complicating pregnancy: Secondary | ICD-10-CM | POA: Diagnosis not present

## 2022-10-04 DIAGNOSIS — R293 Abnormal posture: Secondary | ICD-10-CM

## 2022-10-04 DIAGNOSIS — M6281 Muscle weakness (generalized): Secondary | ICD-10-CM

## 2022-10-04 DIAGNOSIS — R279 Unspecified lack of coordination: Secondary | ICD-10-CM

## 2022-10-04 NOTE — Therapy (Signed)
OUTPATIENT PHYSICAL THERAPY TREATMENT NOTE   Patient Name: Monique Pruitt MRN: [id number] DOB:1990-09-22, 32 y.o., female Today's Date: 10/04/2022  PCP: None REFERRING PROVIDER: Lennart Pall, MD   END OF SESSION:   PT End of Session - 10/04/22 0802     Visit Number 9    Date for PT Re-Evaluation 10/26/22    Authorization Type [employer]    PT Start Time 0801    PT Stop Time 0850    PT Time Calculation (min) 49 min    Activity Tolerance Patient tolerated treatment well    Behavior During Therapy [facility] for tasks assessed/performed                Past Medical History:  Diagnosis Date   Depression    Genital HSV    Migraine without aura 03/15/2021   Past Surgical History:  Procedure Laterality Date   WISDOM TOOTH EXTRACTION     Patient Active Problem List   Diagnosis Date Noted   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   Rh negative state in antepartum period 05/31/2022   History of HSV 05/29/2022   Encounter for supervision of normal pregnancy 05/25/2022    REFERRING DIAG: O99.891,M54.9 (ICD-10-CM) - Back pain affecting pregnancy in second trimester   THERAPY DIAG:  Back pain affecting pregnancy in second trimester  Muscle weakness (generalized)  Unspecified lack of coordination  Other muscle spasm  Abnormal posture  Rationale for Evaluation and Treatment Rehabilitation  PERTINENT HISTORY: H/O LBP and pelvic pain, left knee pain, 24 weeks and 1 day pregnant EDD: 12/20/22, depression  PRECAUTIONS: other, pregnant  SUBJECTIVE:                                                                                                                                                                                      SUBJECTIVE STATEMENT:  ***  PAIN:  Are you having pain? Yes: NPRS scale: 5/10 Pain location: low back, pelvis Pain description: sore Aggravating factors: sitting too long, standing too long Relieving factors: changing positions, being in the water     OBJECTIVE: (objective measures completed at initial evaluation unless otherwise dated)  DIAGNOSTIC FINDINGS:  N/A   SCREENING FOR RED FLAGS: Neg   COGNITION: Overall cognitive status: Within functional limits for tasks assessed                          SENSATION: WFL   MUSCLE LENGTH: HS: mild tightness bil Piriformis:WNL   POSTURE: No Significant postural limitations   PALPATION: TTP in bil gluteals, lumbar   LUMBAR ROM: WNL, some tightness with left rotation 25% deficit   LOWER  EXTREMITY ROM:   WNL   LOWER EXTREMITY MMT:  *pelvic pain unless noted otherwise   MMT Right eval Left eval  Hip flexion 4+ with pain right LB 5  Hip extension 5 5  Hip abduction 5* 4*r  Hip adduction 5 4+  Hip internal rotation      Hip external rotation      Knee flexion 5 4+  Knee extension 5 4+*  Ankle dorsiflexion 5 5   (Blank rows = not tested)   LUMBAR SPECIAL TESTS:  FABER test: Negative, negative SIJ sompression/distraction   FUNCTIONAL TESTS:  5 times sit to stand: 23.88 sec pelvic/low pain at 6/10   MODIFIED OSWESTRY: TBD     TODAY'S TREATMENT:     10/05/22 Nustep x 6 min level 5 (PT present to discuss goals and status) Standing hip flexor stretch at steps 3x20 seconds bil Seated on ball: pelvic tilt and circles x 20 each Seated figure 4 3x20 seconds  Manual Therapy: to decrease muscle spasm and pain and improve mobility Trigger Point Dry-Needling  Treatment instructions: Expect mild to moderate muscle soreness. S/S of pneumothorax if dry needled over a lung field, and to seek immediate medical attention should they occur. Patient verbalized understanding of these instructions and education.  Patient Consent Given: Yes Education handout provided: Yes Muscles treated: Lt and Rt gluteals  Treatment response/outcome: Utilized skilled palpation to identify trigger points.  During dry needling able to palpate muscle twitch and muscle elongation  Elongation and  release to Lt and Rt gluteals after needling  Skilled palpation and monitoring by PT during dry needling   10/04/22:Pt arrives for aquatic physical therapy. Treatment took place in 3.5-5.5 feet of water. Water temperature was 90 degrees F. Pt entered the pool via stairs independently with use of hand rails.Seated water bench with 75% submersion Pt performed seated LE AROM exercises 20x in all planes, and concurrent discussion of current status and pain.Pt requires buoyancy of water for support and to offload joints with strengthening exercises.   75% depth water walking with small noodle 10x each direction with intermittent rest breaks, Core/lat press against wall while performing  first term labor breathing. Seated compression hang 2 min. Hip AROM 10x in ea direction Bil mainly for controlled AROM. Posterior pelvic tilt against wall 5 sec hold 10x using first stage labor breathing.   5 min vertical  decompression to end session.       DATE: 09/27/22 Nustep x 6 min level 5 (PT present to discuss goals and status) Standing hip flexor stretch at steps 3x20 seconds bil Seated on ball: pelvic tilt and circles x 20 each Seated figure 4 3x20 seconds  Trigger Point Dry-Needling  Treatment instructions: Expect mild to moderate muscle soreness. S/S of pneumothorax if dry needled over a lung field, and to seek immediate medical attention should they occur. Patient verbalized understanding of these instructions and education.  Patient Consent Given: Yes Education handout provided: Yes Muscles treated: Lt and Rt gluteals  Treatment response/outcome: Utilized skilled palpation to identify trigger points.  During dry needling able to palpate muscle twitch and muscle elongation  Elongation and release to Lt and Rt gluteals after needling  Skilled palpation and monitoring by PT during dry needling  PATIENT EDUCATION:  Education details: PT eval findings, anticipated POC, need for further assessment of pelvic pain, initial HEP, postural awareness, education on log rolling for bed mobility, and discussed acquiring a belly band for support.   Person educated: Patient Education method: Explanation, Demonstration, Verbal cues, and Handouts Education comprehension: verbalized understanding and returned demonstration   HOME EXERCISE PROGRAM:  Access Code: [email or web address] URL: [email or web address] Date: 09/27/2022 Prepared by: Tresa Endo  Exercises - Seated Transversus Abdominis Bracing  - 2 x daily - 7 x weekly - 2 sets - 10 reps - Seated Pelvic Tilt  - 2 x daily - 7 x weekly - 2 sets - 10 reps - Cat Cow  - 2 x daily - 7 x weekly - 2 sets - 10 reps - 3 hold - Standing Lumbar Extension  - 2 x daily - 7 x weekly - 2 sets - 10 reps - Reclined Diaphragmatic Breathing  - 1 x daily - 7 x weekly - 1 sets - 20 reps - 1 2 second hold - Standing Hamstring Stretch on Chair  - 1 x daily - 7 x weekly - 1 sets - 3 reps - 30 sec hold - Standing Quad Stretch with Table and Chair Support  - 1 x daily - 7 x weekly - 1 sets - 3 reps - 30 sec hold - Supine Hip Adduction Isometric with Ball  - 1 x daily - 7 x weekly - 1 sets - 10 reps - 10 sec hold - Hooklying Isometric Hip Abduction with Belt  - 1 x daily - 7 x weekly - 1 sets - 10 reps - 10 sec hold - Seated Figure 4 Piriformis Stretch  - 3 x daily - 7 x weekly - 1 sets - 3 reps - 20 hold - Hip Flexor Stretch on Step  - 3 x daily - 7 x weekly - 1 sets - 3 reps - 20 hold  ASSESSMENT:   CLINICAL IMPRESSION: ***  OBJECTIVE IMPAIRMENTS: decreased activity tolerance, decreased ROM, decreased strength, increased muscle spasms, impaired flexibility, and pain.    ACTIVITY LIMITATIONS: sitting, standing, transfers, and locomotion level   PARTICIPATION LIMITATIONS: occupation   PERSONAL FACTORS: Past/current experiences and 1-2 comorbidities: previous  LB/pelvic pain and left knee pain  are also affecting patient's functional outcome.    REHAB POTENTIAL: Excellent   CLINICAL DECISION MAKING: Stable/uncomplicated   EVALUATION COMPLEXITY: Low     GOALS: Goals reviewed with patient? Yes   SHORT TERM GOALS: Target date: 09/14/2022   The patient will demonstrate knowledge of basic self care strategies and exercises to promote healing including log-rolling to get in/out of bed   Baseline: Goal status:MET   2.  The patient will report a 30% improvement in pain levels with functional activities which are currently difficult including sit to stand, turning over in bed, getting in/out of car and walking. Baseline: 40% better (09/21/22) Goal status: MET    3.  Patient to complete Modified Oswestry by 2nd land visit for baseline functional assessment. Baseline:  Goal status: INITIAL       LONG TERM GOALS: Target date: 10/26/2022   The patient will be independent in a safe self progression of a home exercise program to promote further recovery of function   Baseline:  Goal status: INITIAL   2. The patient will report a 75% improvement in pain levels with functional activities which are currently difficult including bed mobility, sit to stand, getting in/out of car and walking Baseline:  Goal  status: INITIAL   3.  The patient will have improved hip flexor and extensor muscle strength to 5/5 needed for lifting medium weight objects and carrying her infant  Baseline:  Goal status: INITIAL   4.  Improved modified Oswestry score by 12% indicating improved function with less pain Baseline: TBD Goal status: INITIAL     PLAN:   PT FREQUENCY: 2x/week   PT DURATION: 8 weeks   PLANNED INTERVENTIONS: Therapeutic exercises, Therapeutic activity, Neuromuscular re-education, Patient/Family education, Self Care, Joint mobilization, Aquatic Therapy, Spinal mobilization, Cryotherapy, Moist heat, Taping, and Manual therapy.   PLAN FOR NEXT  SESSION: see how pt responds to DN to gluteals, core and hip strength and flexibility, manual therapy as needed   Solon Palm, PT 10/04/22 9:41 AM

## 2022-10-05 ENCOUNTER — Encounter: Payer: Self-pay | Admitting: Physical Therapy

## 2022-10-05 ENCOUNTER — Ambulatory Visit: Payer: Commercial Managed Care - PPO | Admitting: Physical Therapy

## 2022-10-05 DIAGNOSIS — R293 Abnormal posture: Secondary | ICD-10-CM

## 2022-10-05 DIAGNOSIS — M62838 Other muscle spasm: Secondary | ICD-10-CM

## 2022-10-05 DIAGNOSIS — M549 Dorsalgia, unspecified: Secondary | ICD-10-CM

## 2022-10-05 DIAGNOSIS — R279 Unspecified lack of coordination: Secondary | ICD-10-CM

## 2022-10-05 DIAGNOSIS — O99891 Other specified diseases and conditions complicating pregnancy: Secondary | ICD-10-CM | POA: Diagnosis not present

## 2022-10-06 ENCOUNTER — Ambulatory Visit: Payer: Commercial Managed Care - PPO

## 2022-10-09 ENCOUNTER — Encounter: Payer: Self-pay | Admitting: Obstetrics and Gynecology

## 2022-10-09 ENCOUNTER — Ambulatory Visit (INDEPENDENT_AMBULATORY_CARE_PROVIDER_SITE_OTHER): Payer: Commercial Managed Care - PPO | Admitting: Obstetrics and Gynecology

## 2022-10-09 VITALS — BP 123/81 | HR 109 | Wt 167.0 lb

## 2022-10-09 DIAGNOSIS — O360930 Maternal care for other rhesus isoimmunization, third trimester, not applicable or unspecified: Secondary | ICD-10-CM

## 2022-10-09 DIAGNOSIS — Z6791 Unspecified blood type, Rh negative: Secondary | ICD-10-CM | POA: Diagnosis not present

## 2022-10-09 DIAGNOSIS — Z8619 Personal history of other infectious and parasitic diseases: Secondary | ICD-10-CM

## 2022-10-09 DIAGNOSIS — O26899 Other specified pregnancy related conditions, unspecified trimester: Secondary | ICD-10-CM

## 2022-10-09 DIAGNOSIS — Z3A29 29 weeks gestation of pregnancy: Secondary | ICD-10-CM | POA: Diagnosis not present

## 2022-10-09 DIAGNOSIS — Z23 Encounter for immunization: Secondary | ICD-10-CM

## 2022-10-09 DIAGNOSIS — Z3493 Encounter for supervision of normal pregnancy, unspecified, third trimester: Secondary | ICD-10-CM

## 2022-10-09 MED ORDER — CYCLOBENZAPRINE HCL 10 MG PO TABS: 10.0000 mg | ORAL_TABLET | Freq: Three times a day (TID) | ORAL | 2 refills | Status: DC | PRN

## 2022-10-09 MED ORDER — RHO D IMMUNE GLOBULIN 1500 UNIT/2ML IJ SOSY
300.0000 ug | PREFILLED_SYRINGE | Freq: Once | INTRAMUSCULAR | Status: AC
  Administered 2022-10-09: 300 ug via INTRAMUSCULAR

## 2022-10-09 NOTE — Progress Notes (Signed)
   PRENATAL VISIT NOTE  Subjective:  Monique Pruitt is a 32 y.o. G1P0000 at [redacted]w[redacted]d being seen today for ongoing prenatal care.  She is currently monitored for the following issues for this low-risk pregnancy and has Encounter for supervision of normal pregnancy; History of HSV; Rh negative state in antepartum period; and Choroid plexus cyst on their problem list.  Patient reports  worsening pelvic and lower back pain .  Contractions: Not present. Vag. Bleeding: None.  Movement: Present. Denies leaking of fluid.   The following portions of the patient's history were reviewed and updated as appropriate: allergies, current medications, past family history, past medical history, past social history, past surgical history and problem list.   Objective:   Vitals:   10/09/22 0812  BP: 123/81  Pulse: (!) 109  Weight: 167 lb (75.8 kg)    Fetal Status: Fetal Heart Rate (bpm): 139 Fundal Height: 31 cm Movement: Present     General:  Alert, oriented and cooperative. Patient is in no acute distress.  Skin: Skin is warm and dry. No rash noted.   Cardiovascular: Normal heart rate noted  Respiratory: Normal respiratory effort, no problems with respiration noted  Abdomen: Soft, gravid, appropriate for gestational age.  Pain/Pressure: Present     Pelvic: Cervical exam deferred        Extremities: Normal range of motion.  Edema: None  Mental Status: Normal mood and affect. Normal behavior. Normal judgment and thought content.   Assessment and Plan:  Pregnancy: G1P0000 at [redacted]w[redacted]d 1. Encounter for supervision of normal pregnancy in third trimester, unspecified gravidity Patient reports worsening lower back and pelvic pain despite physical therapy. Her pelvic pain is worst and she is scheduled for pelvic floor physical therapy evaluation on 6/13 Patient is requesting a work note to start short term disability which was provided Patient is researching pediatrician  2. Rh negative state in  antepartum period Rhogam and tdap today  3. History of HSV Prophylaxis at 36 weeks  Preterm labor symptoms and general obstetric precautions including but not limited to vaginal bleeding, contractions, leaking of fluid and fetal movement were reviewed in detail with the patient. Please refer to After Visit Summary for other counseling recommendations.   Return in about 2 weeks (around 10/23/2022) for in person, ROB, Low risk.  Future Appointments  Date Time Provider Department Center  10/11/2022  8:00 AM Ane Payment L, PTA [facility] None  10/12/2022  8:00 AM Riddles, Enrique Sack, PT [facility] None  10/18/2022  8:45 AM Ane Payment L, PTA [facility] None  10/19/2022  8:00 AM Barbaraann Faster, PT [facility] None  10/25/2022  8:00 AM Manfred Shirts, PTA [facility] None  10/30/2022  8:10 AM Lennart Pall, MD [facility]  10/31/2022  8:45 AM Donalee Citrin, Ivory Broad, PT [facility] None  11/08/2022  8:00 AM Manfred Shirts, PTA [facility] None  11/14/2022  8:45 AM Edrick Oh, PT [facility] None  11/21/2022  8:00 AM Edrick Oh, PT [facility] None  11/22/2022  8:00 AM Manfred Shirts, PTA [facility] None  11/28/2022  8:00 AM Edrick Oh, PT [facility] None  11/29/2022  8:00 AM Manfred Shirts, PTA [facility] None  12/06/2022  8:00 AM Edrick Oh, PT [facility] None  12/08/2022  1:00 PM Manfred Shirts, PTA [facility] None    Catalina Antigua, MD

## 2022-10-10 ENCOUNTER — Ambulatory Visit: Payer: Commercial Managed Care - PPO | Attending: Obstetrics and Gynecology | Admitting: Physical Therapy

## 2022-10-10 ENCOUNTER — Encounter: Payer: Self-pay | Admitting: Physical Therapy

## 2022-10-10 ENCOUNTER — Other Ambulatory Visit: Payer: Self-pay

## 2022-10-10 DIAGNOSIS — R279 Unspecified lack of coordination: Secondary | ICD-10-CM | POA: Insufficient documentation

## 2022-10-10 DIAGNOSIS — O26892 Other specified pregnancy related conditions, second trimester: Secondary | ICD-10-CM | POA: Insufficient documentation

## 2022-10-10 DIAGNOSIS — M62838 Other muscle spasm: Secondary | ICD-10-CM | POA: Diagnosis present

## 2022-10-10 DIAGNOSIS — M549 Dorsalgia, unspecified: Secondary | ICD-10-CM | POA: Diagnosis present

## 2022-10-10 DIAGNOSIS — M6281 Muscle weakness (generalized): Secondary | ICD-10-CM | POA: Diagnosis present

## 2022-10-10 DIAGNOSIS — O99891 Other specified diseases and conditions complicating pregnancy: Secondary | ICD-10-CM | POA: Insufficient documentation

## 2022-10-10 DIAGNOSIS — R262 Difficulty in walking, not elsewhere classified: Secondary | ICD-10-CM | POA: Insufficient documentation

## 2022-10-10 DIAGNOSIS — R102 Pelvic and perineal pain: Secondary | ICD-10-CM | POA: Diagnosis present

## 2022-10-10 DIAGNOSIS — R293 Abnormal posture: Secondary | ICD-10-CM | POA: Diagnosis present

## 2022-10-10 NOTE — Therapy (Signed)
OUTPATIENT PHYSICAL THERAPY FEMALE PELVIC EVALUATION   Patient Name: Monique Pruitt MRN: [id number] DOB:03-24-1991, 32 y.o., female Today's Date: 10/10/2022  END OF SESSION:  PT End of Session - 10/10/22 1003     Visit Number 11   PFPT eval 10/10/22   Date for PT Re-Evaluation 01/10/23    Authorization Type [employer]    PT Start Time 0845    PT Stop Time 0928    PT Time Calculation (min) 43 min    Activity Tolerance Patient tolerated treatment well    Behavior During Therapy [facility] for tasks assessed/performed             Past Medical History:  Diagnosis Date   Depression    Genital HSV    Migraine without aura 03/15/2021   Past Surgical History:  Procedure Laterality Date   WISDOM TOOTH EXTRACTION     Patient Active Problem List   Diagnosis Date Noted   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   Rh negative state in antepartum period 05/31/2022   History of HSV 05/29/2022   Encounter for supervision of normal pregnancy 05/25/2022   PCP: None REFERRING PROVIDER: Lennart Pall, MD   REFERRING DIAG: O99.891,M54.9 (ICD-10-CM) - Back pain affecting pregnancy in second trimester   THERAPY DIAG:  Other muscle spasm  Abnormal posture  Muscle weakness (generalized)  Unspecified lack of coordination  Rationale for Evaluation and Treatment Rehabilitation  PERTINENT HISTORY: H/O LBP and pelvic pain, left knee pain, 24 weeks and 1 day pregnant EDD: 12/20/22, depression  PRECAUTIONS: other, pregnant  SUBJECTIVE:                                                                                                                                                                                      SUBJECTIVE STATEMENT:   Was feeling better, but now in 3rd trimester pain has returned.,   PAIN:  Are you having pain? Yes: NPRS scale: 4-8/10 Pain location: low back, pelvis Pain description: sore Aggravating factors: sitting too long, standing too long Relieving factors:  changing positions, being in the water   PERTINENT HISTORY:  29wks  low-risk pregnancy and has Encounter for supervision of normal pregnancy; History of HSV; Rh negative state in antepartum period; and Choroid plexus cyst   Sexual abuse: No  BOWEL MOVEMENT: Pain with bowel movement: No Type of bowel movement:Type (Bristol Stool Scale) 4, Frequency slightly decreased but at least every 2 days, and Strain No Fully empty rectum: Yes:   Leakage: No Pads: No Fiber supplement: No  URINATION: Pain with urination: No Fully empty bladder: Yes:   Stream: Strong Urgency: Yes: but with pregnancy Frequency: 2-4 hours Leakage:  none Pads: No  INTERCOURSE: Pain with intercourse:  slightly but not bad per pt, and not pain felt in hip/low back Ability to have vaginal penetration:  Yes:   Climax: not painful Marinoff Scale: 0/3  PREGNANCY: Vaginal deliveries 0 Tearing No C-section deliveries 0 Currently pregnant Yes:    PROLAPSE: None   OBJECTIVE:    PALPATION:   General TTP in bil gluteals, lumbar                 External Perineal Exam tension and TTP at Lt external pelvic floor                              Internal Pelvic Floor one small trigger point noted at Rt pubococcygeus however majority of pain and tension noted at lt side. Pt reports this is replicating her pain felt with hip/low back. Great tension noted at Lt pubococcygeus, iliococcygeus and bulbocavernosus.    Patient confirms identification and approves PT to assess internal pelvic floor and treatment Yes  PELVIC MMT:   MMT eval  Vaginal Not assessed today due to pain  Internal Anal Sphincter   External Anal Sphincter   Puborectalis   Diastasis Recti   (Blank rows = not tested)        TONE: Increased   PROLAPSE: Not seen in hooklying     OBJECTIVE: (objective measures completed at initial evaluation unless otherwise dated)  DIAGNOSTIC FINDINGS:  N/A   SCREENING FOR RED FLAGS: Neg    COGNITION: Overall cognitive status: Within functional limits for tasks assessed                          SENSATION: WFL   MUSCLE LENGTH: HS: mild tightness bil Piriformis:WNL   POSTURE: No Significant postural limitations   PALPATION: TTP in bil gluteals, lumbar   LUMBAR ROM: WNL, some tightness with left rotation 25% deficit   LOWER EXTREMITY ROM:   WNL   LOWER EXTREMITY MMT:  *pelvic pain unless noted otherwise   MMT Right eval Left eval  Hip flexion 4+ with pain right LB 5  Hip extension 5 5  Hip abduction 5* 4*r  Hip adduction 5 4+  Hip internal rotation      Hip external rotation      Knee flexion 5 4+  Knee extension 5 4+*  Ankle dorsiflexion 5 5   (Blank rows = not tested)   LUMBAR SPECIAL TESTS:  FABER test: Negative, negative SIJ sompression/distraction   FUNCTIONAL TESTS:  5 times sit to stand: 23.88 sec pelvic/low pain at 6/10   MODIFIED OSWESTRY: TBD     TODAY'S TREATMENT:     10/10/22: Examination completed, findings reviewed, pt educated on POC, pelvic wand use. Pt motivated to participate in PT and agreeable to attempt recommendations. Manual for gentle stretching with vaginal internal to decrease tension at muscles mentioned above. Pt tolerated well and reported improved pain levels afterward. KT tape placed by PT at abdomen x5 for improved abdominal support and decreased pain . Pt tolerated well and educated on how to remove as needed.      Pt educated on pregnancy precautions including but not limited to vaginal bleeding, contractions, leaking of fluid and fetal movement. If any of these occur halt activity and please consult OBGYN for recommended care.  PATIENT EDUCATION:  Education details: PT eval findings, anticipated POC, initial HEP, postural awareness, education on log rolling for bed mobility, and discussed acquiring a belly band for support.   Person educated: Patient Education method:  Explanation, Demonstration, Verbal cues, and Handouts Education comprehension: verbalized understanding and returned demonstration   HOME EXERCISE PROGRAM:  Access Code: [email or web address] URL: [email or web address] Date: 09/27/2022 Prepared by: Tresa Endo  Exercises - Seated Transversus Abdominis Bracing  - 2 x daily - 7 x weekly - 2 sets - 10 reps - Seated Pelvic Tilt  - 2 x daily - 7 x weekly - 2 sets - 10 reps - Cat Cow  - 2 x daily - 7 x weekly - 2 sets - 10 reps - 3 hold - Standing Lumbar Extension  - 2 x daily - 7 x weekly - 2 sets - 10 reps - Reclined Diaphragmatic Breathing  - 1 x daily - 7 x weekly - 1 sets - 20 reps - 1 2 second hold - Standing Hamstring Stretch on Chair  - 1 x daily - 7 x weekly - 1 sets - 3 reps - 30 sec hold - Standing Quad Stretch with Table and Chair Support  - 1 x daily - 7 x weekly - 1 sets - 3 reps - 30 sec hold - Supine Hip Adduction Isometric with Ball  - 1 x daily - 7 x weekly - 1 sets - 10 reps - 10 sec hold - Hooklying Isometric Hip Abduction with Belt  - 1 x daily - 7 x weekly - 1 sets - 10 reps - 10 sec hold - Seated Figure 4 Piriformis Stretch  - 3 x daily - 7 x weekly - 1 sets - 3 reps - 20 hold - Hip Flexor Stretch on Step  - 3 x daily - 7 x weekly - 1 sets - 3 reps - 20 hold  ASSESSMENT:   CLINICAL IMPRESSION: pt presents for pelvic evaluation today, she has been seeing ortho therapy for hip/low back pain with pregnancy. Pt states this really helped at first but now she's in her third trimester and pain has returned. Pt consented to internal vaginal assessment and treatment today, found to have profound tension throughout Lt side, tolerated gentle manual work here as well as KT tape at abdomen after this. Pt reported decreased pain from 4-5/10 to 1-2/10. Pt pleased with this and during internal work today, pt had questions about her pelvic wand for home and educated on how to this based on internal work today. Pt denied additional questions.  Trea continues to demonstrate potential for improvement and would benefit from continued skilled therapy to address impairments.    OBJECTIVE IMPAIRMENTS: decreased activity tolerance, decreased ROM, decreased strength, increased muscle spasms, impaired flexibility, and pain.    ACTIVITY LIMITATIONS: sitting, standing, transfers, and locomotion level   PARTICIPATION LIMITATIONS: occupation   PERSONAL FACTORS: Past/current experiences and 1-2 comorbidities: previous LB/pelvic pain and left knee pain  are also affecting patient's functional outcome.    REHAB POTENTIAL: Excellent   CLINICAL DECISION MAKING: Stable/uncomplicated   EVALUATION COMPLEXITY: Low     GOALS: Goals reviewed with patient? Yes   SHORT TERM GOALS: Target date: 09/14/2022   The patient will demonstrate knowledge of basic self care strategies and exercises to promote healing including log-rolling to get in/out of bed   Baseline: Goal status:MET   2.  The patient will report a 30% improvement in pain levels with functional activities which are currently difficult including  sit to stand, turning over in bed, getting in/out of car and walking. Baseline: 40% better (09/21/22) Goal status: MET    3.  Patient to complete Modified Oswestry by 2nd land visit for baseline functional assessment. Baseline:  Goal status: DEFERRED       LONG TERM GOALS: Target date: 10/26/2022   The patient will be independent in a safe self progression of a home exercise program to promote further recovery of function   Baseline:  Goal status: INITIAL   2. The patient will report a 75% improvement in pain levels with functional activities which are currently difficult including bed mobility, sit to stand, getting in/out of car and walking Baseline:  Goal status: INITIAL   3.  The patient will have improved hip flexor and extensor muscle strength to 5/5 needed for lifting medium weight objects and carrying her infant  Baseline:   Goal status: INITIAL   4.  Improved modified Oswestry score by 12% indicating improved function with less pain Baseline: TBD Goal status: INITIAL     PLAN:   PT FREQUENCY: 3x/week   PT DURATION: 8 weeks   PLANNED INTERVENTIONS: Therapeutic exercises, Therapeutic activity, Neuromuscular re-education, Patient/Family education, Self Care, Joint mobilization, Aquatic Therapy, Spinal mobilization, Cryotherapy, Moist heat, Taping, and Manual therapy.   PLAN FOR NEXT SESSION: core and hip strength and flexibility, manual therapy as needed, internal pelvic floor work as needed, stretching for birth prep   Otelia Sergeant, PT, DPT 10/09/2408:22 AM

## 2022-10-10 NOTE — Therapy (Unsigned)
+ OUTPATIENT PHYSICAL THERAPY TREATMENT NOTE   Patient Name: Monique Pruitt MRN: [id number] DOB:1991-01-28, 32 y.o., female Today's Date: 10/11/2022  PCP: None REFERRING PROVIDER: Lennart Pall, MD   END OF SESSION:   PT End of Session - 10/11/22 0756     Visit Number 12    Date for PT Re-Evaluation 01/10/23    Authorization Type [employer]    PT Start Time 0756    PT Stop Time 0845    PT Time Calculation (min) 49 min    Activity Tolerance Patient tolerated treatment well    Behavior During Therapy [facility] for tasks assessed/performed                 Past Medical History:  Diagnosis Date   Depression    Genital HSV    Migraine without aura 03/15/2021   Past Surgical History:  Procedure Laterality Date   WISDOM TOOTH EXTRACTION     Patient Active Problem List   Diagnosis Date Noted   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   Rh negative state in antepartum period 05/31/2022   History of HSV 05/29/2022   Encounter for supervision of normal pregnancy 05/25/2022    REFERRING DIAG: O99.891,M54.9 (ICD-10-CM) - Back pain affecting pregnancy in second trimester   THERAPY DIAG:  Other muscle spasm  Abnormal posture  Muscle weakness (generalized)  Unspecified lack of coordination  Back pain affecting pregnancy in second trimester  Pelvic pain affecting pregnancy in second trimester, antepartum  Difficulty in walking, not elsewhere classified  Rationale for Evaluation and Treatment Rehabilitation  PERTINENT HISTORY: H/O LBP and pelvic pain, left knee pain, 24 weeks and 1 day pregnant EDD: 12/20/22, depression  PRECAUTIONS: other, pregnant  SUBJECTIVE:                                                                                                                                                                                      SUBJECTIVE STATEMENT:  It is early so I am doing ok right now. The weekend was rough. [facility] This  was helpful.   PAIN:  Are you having pain? Yes: NPRS scale: 4/10 Pain location: low back, pelvis Pain description: sore Aggravating factors: sitting too long, standing too long Relieving factors: changing positions, being in the water    OBJECTIVE: (objective measures completed at initial evaluation unless otherwise dated)  DIAGNOSTIC FINDINGS:  N/A   SCREENING FOR RED FLAGS: Neg   COGNITION: Overall cognitive status: Within functional limits for tasks assessed  SENSATION: WFL   MUSCLE LENGTH: HS: mild tightness bil Piriformis:WNL   POSTURE: No Significant postural limitations   PALPATION: TTP in bil gluteals, lumbar   LUMBAR ROM: WNL, some tightness with left rotation 25% deficit   LOWER EXTREMITY ROM:   WNL   LOWER EXTREMITY MMT:  *pelvic pain unless noted otherwise   MMT Right eval Left eval  Hip flexion 4+ with pain right LB 5  Hip extension 5 5  Hip abduction 5* 4*r  Hip adduction 5 4+  Hip internal rotation      Hip external rotation      Knee flexion 5 4+  Knee extension 5 4+*  Ankle dorsiflexion 5 5   (Blank rows = not tested)   LUMBAR SPECIAL TESTS:  FABER test: Negative, negative SIJ sompression/distraction   FUNCTIONAL TESTS:  5 times sit to stand: 23.88 sec pelvic/low pain at 6/10   MODIFIED OSWESTRY: TBD     TODAY'S TREATMENT:     10/10/22:Pt arrives for aquatic physical therapy. Treatment took place in 3.5-5.5 feet of water. Water temperature was 90 degrees F. Pt entered the pool via stairs independently with use of hand rails.Seated water bench with 75% submersion Pt performed seated LE AROM exercises 20x in all planes, and concurrent discussion of current status and pain.Pt requires buoyancy of water for support and to offload joints with strengthening exercises.   75% depth water walking with UE wts for push/pull10x each direction with intermittent rest breaks, Core/lat press against wall while performing  first  term labor breathing 2x10. Seated compression hang 2 min. Hip AROM 10x in ea direction Bil mainly for controlled AROM. Posterior pelvic tilt against wall 5 sec hold 10x using first stage labor breathing.   8 min vertical  decompression to end session.       10/05/22 Nustep x 3 min level 5 (PT present to discuss goals and status) Seated on ball: pelvic tilt and circles x 20 each; ADDuction pulse x 20  Standing in slight forward flexion ADD squeeze with small ball x 10 5 sec hold Squats to elevated mat table 2x10 Manual Therapy: to decrease muscle spasm and pain and improve mobility KT tape 5 strips to support abdomen 2 vertical, 2 diagonal and one transverse prior to TE  Trigger Point Dry-Needling  Treatment instructions: Expect mild to moderate muscle soreness. S/S of pneumothorax if dry needled over a lung field, and to seek immediate medical attention should they occur. Patient verbalized understanding of these instructions and education.  Patient Consent Given: Yes Education handout provided: Yes Muscles treated: Lt and Rt gluteals and piriformis Treatment response/outcome: Utilized skilled palpation to identify trigger points.  During dry needling able to palpate muscle twitch and muscle elongation  Elongation and release to Lt and Rt gluteals after needling  Skilled palpation and monitoring by PT during dry needling   10/04/22:Pt arrives for aquatic physical therapy. Treatment took place in 3.5-5.5 feet of water. Water temperature was 90 degrees F. Pt entered the pool via stairs independently with use of hand rails.Seated water bench with 75% submersion Pt performed seated LE AROM exercises 20x in all planes, and concurrent discussion of current status and pain.Pt requires buoyancy of water for support and to offload joints with strengthening exercises.   75% depth water walking with small noodle 10x each direction with intermittent rest breaks, Core/lat press against wall while performing   first term labor breathing. Seated compression hang 2 min. Hip AROM 10x in ea direction Bil  mainly for controlled AROM. Posterior pelvic tilt against wall 5 sec hold 10x using first stage labor breathing.   5 min vertical  decompression to end session.                                                                                                                  PATIENT EDUCATION:  Education details: PT eval findings, anticipated POC, need for further assessment of pelvic pain, initial HEP, postural awareness, education on log rolling for bed mobility, and discussed acquiring a belly band for support.   Person educated: Patient Education method: Explanation, Demonstration, Verbal cues, and Handouts Education comprehension: verbalized understanding and returned demonstration   HOME EXERCISE PROGRAM:  Access Code: [email or web address] URL: [email or web address] Date: 09/27/2022 Prepared by: Tresa Endo  Exercises - Seated Transversus Abdominis Bracing  - 2 x daily - 7 x weekly - 2 sets - 10 reps - Seated Pelvic Tilt  - 2 x daily - 7 x weekly - 2 sets - 10 reps - Cat Cow  - 2 x daily - 7 x weekly - 2 sets - 10 reps - 3 hold - Standing Lumbar Extension  - 2 x daily - 7 x weekly - 2 sets - 10 reps - Reclined Diaphragmatic Breathing  - 1 x daily - 7 x weekly - 1 sets - 20 reps - 1 2 second hold - Standing Hamstring Stretch on Chair  - 1 x daily - 7 x weekly - 1 sets - 3 reps - 30 sec hold - Standing Quad Stretch with Table and Chair Support  - 1 x daily - 7 x weekly - 1 sets - 3 reps - 30 sec hold - Supine Hip Adduction Isometric with Ball  - 1 x daily - 7 x weekly - 1 sets - 10 reps - 10 sec hold - Hooklying Isometric Hip Abduction with Belt  - 1 x daily - 7 x weekly - 1 sets - 10 reps - 10 sec hold - Seated Figure 4 Piriformis Stretch  - 3 x daily - 7 x weekly - 1 sets - 3 reps - 20 hold - Hip Flexor Stretch on Step  - 3 x daily - 7 x weekly - 1 sets - 3 reps - 20 hold  ASSESSMENT:    CLINICAL IMPRESSION:  Pt arrives with mild pelvic pain but it is early in the day. Pt was able to use some of the techniques she has learned here at aquatic PT to help manage her pain over the weekend. Pt has slightly more difficulty performing post tilt due to increasing lordosis as her baby grows. No pain moving in th ewater.    OBJECTIVE IMPAIRMENTS: decreased activity tolerance, decreased ROM, decreased strength, increased muscle spasms, impaired flexibility, and pain.    ACTIVITY LIMITATIONS: sitting, standing, transfers, and locomotion level   PARTICIPATION LIMITATIONS: occupation   PERSONAL FACTORS: Past/current experiences and 1-2 comorbidities: previous LB/pelvic pain and left knee pain  are also affecting patient's functional outcome.    REHAB  POTENTIAL: Excellent   CLINICAL DECISION MAKING: Stable/uncomplicated   EVALUATION COMPLEXITY: Low     GOALS: Goals reviewed with patient? Yes   SHORT TERM GOALS: Target date: 09/14/2022   The patient will demonstrate knowledge of basic self care strategies and exercises to promote healing including log-rolling to get in/out of bed   Baseline: Goal status:MET   2.  The patient will report a 30% improvement in pain levels with functional activities which are currently difficult including sit to stand, turning over in bed, getting in/out of car and walking. Baseline: 40% better (09/21/22) Goal status: MET    3.  Patient to complete Modified Oswestry by 2nd land visit for baseline functional assessment. Baseline:  Goal status: DEFERRED       LONG TERM GOALS: Target date: 10/26/2022   The patient will be independent in a safe self progression of a home exercise program to promote further recovery of function   Baseline:  Goal status: INITIAL   2. The patient will report a 75% improvement in pain levels with functional activities which are currently difficult including bed mobility, sit to stand, getting in/out of car and  walking Baseline:  Goal status: INITIAL   3.  The patient will have improved hip flexor and extensor muscle strength to 5/5 needed for lifting medium weight objects and carrying her infant  Baseline:  Goal status: INITIAL   4.  Improved modified Oswestry score by 12% indicating improved function with less pain Baseline: TBD Goal status: INITIAL     PLAN:   PT FREQUENCY: 2x/week   PT DURATION: 8 weeks   PLANNED INTERVENTIONS: Therapeutic exercises, Therapeutic activity, Neuromuscular re-education, Patient/Family education, Self Care, Joint mobilization, Aquatic Therapy, Spinal mobilization, Cryotherapy, Moist heat, Taping, and Manual therapy.   PLAN FOR NEXT SESSION: Assess goals (recert after pt vacation), core and hip strength and flexibility, manual therapy as needed   Ane Payment, PTA 10/11/22 8:46 AM

## 2022-10-11 ENCOUNTER — Encounter: Payer: Self-pay | Admitting: Physical Therapy

## 2022-10-11 ENCOUNTER — Ambulatory Visit: Payer: Commercial Managed Care - PPO | Admitting: Physical Therapy

## 2022-10-11 DIAGNOSIS — M62838 Other muscle spasm: Secondary | ICD-10-CM

## 2022-10-11 DIAGNOSIS — M549 Dorsalgia, unspecified: Secondary | ICD-10-CM

## 2022-10-11 DIAGNOSIS — R262 Difficulty in walking, not elsewhere classified: Secondary | ICD-10-CM

## 2022-10-11 DIAGNOSIS — R293 Abnormal posture: Secondary | ICD-10-CM

## 2022-10-11 DIAGNOSIS — R279 Unspecified lack of coordination: Secondary | ICD-10-CM

## 2022-10-11 DIAGNOSIS — M6281 Muscle weakness (generalized): Secondary | ICD-10-CM

## 2022-10-11 DIAGNOSIS — O26892 Other specified pregnancy related conditions, second trimester: Secondary | ICD-10-CM

## 2022-10-11 NOTE — Therapy (Signed)
+ OUTPATIENT PHYSICAL THERAPY TREATMENT NOTE   Patient Name: Monique Pruitt MRN: [id number] DOB:1990-06-14, 32 y.o., female Today's Date: 10/11/2022  PCP: None REFERRING PROVIDER: Lennart Pall, MD   END OF SESSION:         Past Medical History:  Diagnosis Date   Depression    Genital HSV    Migraine without aura 03/15/2021   Past Surgical History:  Procedure Laterality Date   WISDOM TOOTH EXTRACTION     Patient Active Problem List   Diagnosis Date Noted   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   Rh negative state in antepartum period 05/31/2022   History of HSV 05/29/2022   Encounter for supervision of normal pregnancy 05/25/2022    REFERRING DIAG: O99.891,M54.9 (ICD-10-CM) - Back pain affecting pregnancy in second trimester   THERAPY DIAG:  No diagnosis found.  Rationale for Evaluation and Treatment Rehabilitation  PERTINENT HISTORY: H/O LBP and pelvic pain, left knee pain, 24 weeks and 1 day pregnant EDD: 12/20/22, depression  PRECAUTIONS: other, pregnant  SUBJECTIVE:                                                                                                                                                                                      SUBJECTIVE STATEMENT: ***   PAIN:  Are you having pain? Yes: NPRS scale: 4/10 Pain location: low back, pelvis Pain description: sore Aggravating factors: sitting too long, standing too long Relieving factors: changing positions, being in the water    OBJECTIVE: (objective measures completed at initial evaluation unless otherwise dated)  DIAGNOSTIC FINDINGS:  N/A   SCREENING FOR RED FLAGS: Neg   COGNITION: Overall cognitive status: Within functional limits for tasks assessed                          SENSATION: WFL   MUSCLE LENGTH: HS: mild tightness bil Piriformis:WNL   POSTURE: No Significant postural limitations   PALPATION: TTP in bil gluteals, lumbar   LUMBAR ROM: WNL, some  tightness with left rotation 25% deficit   LOWER EXTREMITY ROM:   WNL   LOWER EXTREMITY MMT:  *pelvic pain unless noted otherwise   MMT Right eval Left eval  Hip flexion 4+ with pain right LB 5  Hip extension 5 5  Hip abduction 5* 4*r  Hip adduction 5 4+  Hip internal rotation      Hip external rotation      Knee flexion 5 4+  Knee extension 5 4+*  Ankle dorsiflexion 5 5   (Blank rows = not tested)   LUMBAR SPECIAL TESTS:  FABER test: Negative, negative SIJ sompression/distraction  FUNCTIONAL TESTS:  5 times sit to stand: 23.88 sec pelvic/low pain at 6/10   MODIFIED OSWESTRY: TBD     TODAY'S TREATMENT:     10/12/22 Nustep x 3 min level 5 (PT present to discuss goals and status) Seated on ball: pelvic tilt and circles x 20 each; ADDuction pulse x 20  Standing in slight forward flexion ADD squeeze with small ball x 10 5 sec hold Squats to elevated mat table 2x10 Manual Therapy: to decrease muscle spasm and pain and improve mobility KT tape 5 strips to support abdomen 2 vertical, 2 diagonal and one transverse prior to TE    10/10/22:Pt arrives for aquatic physical therapy. Treatment took place in 3.5-5.5 feet of water. Water temperature was 90 degrees F. Pt entered the pool via stairs independently with use of hand rails.Seated water bench with 75% submersion Pt performed seated LE AROM exercises 20x in all planes, and concurrent discussion of current status and pain.Pt requires buoyancy of water for support and to offload joints with strengthening exercises.   75% depth water walking with UE wts for push/pull10x each direction with intermittent rest breaks, Core/lat press against wall while performing  first term labor breathing 2x10. Seated compression hang 2 min. Hip AROM 10x in ea direction Bil mainly for controlled AROM. Posterior pelvic tilt against wall 5 sec hold 10x using first stage labor breathing.   8 min vertical  decompression to end session.        10/05/22 Nustep x 3 min level 5 (PT present to discuss goals and status) Seated on ball: pelvic tilt and circles x 20 each; ADDuction pulse x 20  Standing in slight forward flexion ADD squeeze with small ball x 10 5 sec hold Squats to elevated mat table 2x10 Manual Therapy: to decrease muscle spasm and pain and improve mobility KT tape 5 strips to support abdomen 2 vertical, 2 diagonal and one transverse prior to TE  Trigger Point Dry-Needling  Treatment instructions: Expect mild to moderate muscle soreness. S/S of pneumothorax if dry needled over a lung field, and to seek immediate medical attention should they occur. Patient verbalized understanding of these instructions and education.  Patient Consent Given: Yes Education handout provided: Yes Muscles treated: Lt and Rt gluteals and piriformis Treatment response/outcome: Utilized skilled palpation to identify trigger points.  During dry needling able to palpate muscle twitch and muscle elongation  Elongation and release to Lt and Rt gluteals after needling  Skilled palpation and monitoring by PT during dry needling   10/04/22:Pt arrives for aquatic physical therapy. Treatment took place in 3.5-5.5 feet of water. Water temperature was 90 degrees F. Pt entered the pool via stairs independently with use of hand rails.Seated water bench with 75% submersion Pt performed seated LE AROM exercises 20x in all planes, and concurrent discussion of current status and pain.Pt requires buoyancy of water for support and to offload joints with strengthening exercises.   75% depth water walking with small noodle 10x each direction with intermittent rest breaks, Core/lat press against wall while performing  first term labor breathing. Seated compression hang 2 min. Hip AROM 10x in ea direction Bil mainly for controlled AROM. Posterior pelvic tilt against wall 5 sec hold 10x using first stage labor breathing.   5 min vertical  decompression to end session.  PATIENT EDUCATION:  Education details: PT eval findings, anticipated POC, need for further assessment of pelvic pain, initial HEP, postural awareness, education on log rolling for bed mobility, and discussed acquiring a belly band for support.   Person educated: Patient Education method: Explanation, Demonstration, Verbal cues, and Handouts Education comprehension: verbalized understanding and returned demonstration   HOME EXERCISE PROGRAM:  Access Code: [email or web address] URL: [email or web address] Date: 09/27/2022 Prepared by: Tresa Endo  Exercises - Seated Transversus Abdominis Bracing  - 2 x daily - 7 x weekly - 2 sets - 10 reps - Seated Pelvic Tilt  - 2 x daily - 7 x weekly - 2 sets - 10 reps - Cat Cow  - 2 x daily - 7 x weekly - 2 sets - 10 reps - 3 hold - Standing Lumbar Extension  - 2 x daily - 7 x weekly - 2 sets - 10 reps - Reclined Diaphragmatic Breathing  - 1 x daily - 7 x weekly - 1 sets - 20 reps - 1 2 second hold - Standing Hamstring Stretch on Chair  - 1 x daily - 7 x weekly - 1 sets - 3 reps - 30 sec hold - Standing Quad Stretch with Table and Chair Support  - 1 x daily - 7 x weekly - 1 sets - 3 reps - 30 sec hold - Supine Hip Adduction Isometric with Ball  - 1 x daily - 7 x weekly - 1 sets - 10 reps - 10 sec hold - Hooklying Isometric Hip Abduction with Belt  - 1 x daily - 7 x weekly - 1 sets - 10 reps - 10 sec hold - Seated Figure 4 Piriformis Stretch  - 3 x daily - 7 x weekly - 1 sets - 3 reps - 20 hold - Hip Flexor Stretch on Step  - 3 x daily - 7 x weekly - 1 sets - 3 reps - 20 hold  ASSESSMENT:   CLINICAL IMPRESSION:   ***   OBJECTIVE IMPAIRMENTS: decreased activity tolerance, decreased ROM, decreased strength, increased muscle spasms, impaired flexibility, and pain.    ACTIVITY LIMITATIONS: sitting, standing, transfers, and locomotion level    PARTICIPATION LIMITATIONS: occupation   PERSONAL FACTORS: Past/current experiences and 1-2 comorbidities: previous LB/pelvic pain and left knee pain  are also affecting patient's functional outcome.    REHAB POTENTIAL: Excellent   CLINICAL DECISION MAKING: Stable/uncomplicated   EVALUATION COMPLEXITY: Low     GOALS: Goals reviewed with patient? Yes   SHORT TERM GOALS: Target date: 09/14/2022   The patient will demonstrate knowledge of basic self care strategies and exercises to promote healing including log-rolling to get in/out of bed   Baseline: Goal status:MET   2.  The patient will report a 30% improvement in pain levels with functional activities which are currently difficult including sit to stand, turning over in bed, getting in/out of car and walking. Baseline: 40% better (09/21/22) Goal status: MET    3.  Patient to complete Modified Oswestry by 2nd land visit for baseline functional assessment. Baseline:  Goal status: DEFERRED       LONG TERM GOALS: Target date: 10/26/2022   The patient will be independent in a safe self progression of a home exercise program to promote further recovery of function   Baseline:  Goal status: INITIAL   2. The patient will report a 75% improvement in pain levels with functional activities which are currently difficult including bed mobility, sit to stand, getting in/out of car and walking  Baseline:  Goal status: INITIAL   3.  The patient will have improved hip flexor and extensor muscle strength to 5/5 needed for lifting medium weight objects and carrying her infant  Baseline:  Goal status: INITIAL   4.  Improved modified Oswestry score by 12% indicating improved function with less pain Baseline: TBD Goal status: INITIAL     PLAN:   PT FREQUENCY: 2x/week   PT DURATION: 8 weeks   PLANNED INTERVENTIONS: Therapeutic exercises, Therapeutic activity, Neuromuscular re-education, Patient/Family education, Self Care, Joint  mobilization, Aquatic Therapy, Spinal mobilization, Cryotherapy, Moist heat, Taping, and Manual therapy.   PLAN FOR NEXT SESSION: Assess goals (recert after pt vacation), core and hip strength and flexibility, manual therapy as needed  Solon Palm, PT  10/11/22 1:08 PM

## 2022-10-12 ENCOUNTER — Ambulatory Visit: Payer: Commercial Managed Care - PPO | Admitting: Physical Therapy

## 2022-10-12 ENCOUNTER — Telehealth: Payer: Self-pay | Admitting: *Deleted

## 2022-10-12 ENCOUNTER — Encounter: Payer: Self-pay | Admitting: Physical Therapy

## 2022-10-12 DIAGNOSIS — R293 Abnormal posture: Secondary | ICD-10-CM

## 2022-10-12 DIAGNOSIS — M62838 Other muscle spasm: Secondary | ICD-10-CM | POA: Diagnosis not present

## 2022-10-12 DIAGNOSIS — M6281 Muscle weakness (generalized): Secondary | ICD-10-CM

## 2022-10-12 MED ORDER — VALACYCLOVIR HCL 1 G PO TABS: ORAL_TABLET | ORAL | 1 refills | Status: DC

## 2022-10-12 NOTE — Telephone Encounter (Cosign Needed)
Pt called stating that she was having an HSV outbreak(she does have a H/O).  She is needing medication calledinto her pharmacy.  Per VO Dr Macon Large Valacyclovir ! Gm BID for 7 days then daily for remainder of pregnancy. This was sent to [facility] and pt is aware of the instructions.

## 2022-10-17 ENCOUNTER — Ambulatory Visit: Payer: Commercial Managed Care - PPO | Admitting: Physical Therapy

## 2022-10-17 DIAGNOSIS — M549 Dorsalgia, unspecified: Secondary | ICD-10-CM

## 2022-10-17 DIAGNOSIS — R279 Unspecified lack of coordination: Secondary | ICD-10-CM

## 2022-10-17 DIAGNOSIS — R293 Abnormal posture: Secondary | ICD-10-CM

## 2022-10-17 DIAGNOSIS — M62838 Other muscle spasm: Secondary | ICD-10-CM | POA: Diagnosis not present

## 2022-10-17 DIAGNOSIS — M6281 Muscle weakness (generalized): Secondary | ICD-10-CM

## 2022-10-17 NOTE — Therapy (Signed)
OUTPATIENT PHYSICAL THERAPY FEMALE PELVIC EVALUATION   Patient Name: Monique Pruitt MRN: [id number] DOB:[birth year], 32 y.o., female Today's Date: 10/17/2022  END OF SESSION:  PT End of Session - 10/17/22 0803     Visit Number 14    Date for PT Re-Evaluation 01/10/23    Authorization Type [employer]    PT Start Time 0802    PT Stop Time 0840    PT Time Calculation (min) 38 min    Activity Tolerance Patient tolerated treatment well    Behavior During Therapy [facility] for tasks assessed/performed             Past Medical History:  Diagnosis Date   Depression    Genital HSV    Migraine without aura 03/15/2021   Past Surgical History:  Procedure Laterality Date   WISDOM TOOTH EXTRACTION     Patient Active Problem List   Diagnosis Date Noted   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   Rh negative state in antepartum period 05/31/2022   History of HSV 05/29/2022   Encounter for supervision of normal pregnancy 05/25/2022   PCP: None REFERRING PROVIDER: Lennart Pall, MD   REFERRING DIAG: O99.891,M54.9 (ICD-10-CM) - Back pain affecting pregnancy in second trimester   THERAPY DIAG:  Other muscle spasm  Abnormal posture  Muscle weakness (generalized)  Unspecified lack of coordination  Rationale for Evaluation and Treatment Rehabilitation  PERTINENT HISTORY: H/O LBP and pelvic pain, left knee pain, 24 weeks and 1 day pregnant EDD: 12/20/22, depression  PRECAUTIONS: other, pregnant  SUBJECTIVE:                                                                                                                                                                                      SUBJECTIVE STATEMENT:   Pt reports she felt a lot better after internal last session with PFPT had good relief for 4 days. Can't think if it was 0 but able to walk and mobilize more normally.   PAIN:  Are you having pain? Yes: NPRS scale: 3/10 Pain location: low back, pelvis LT Pain description:  sore Aggravating factors: sitting too long, standing too long Relieving factors: changing positions, being in the water   PERTINENT HISTORY:  29wks  low-risk pregnancy and has Encounter for supervision of normal pregnancy; History of HSV; Rh negative state in antepartum period; and Choroid plexus cyst   Sexual abuse: No  BOWEL MOVEMENT: Pain with bowel movement: No Type of bowel movement:Type (Bristol Stool Scale) 4, Frequency slightly decreased but at least every 2 days, and Strain No Fully empty rectum: Yes:   Leakage: No Pads: No Fiber supplement: No  URINATION: Pain with  urination: No Fully empty bladder: Yes:   Stream: Strong Urgency: Yes: but with pregnancy Frequency: 2-4 hours Leakage:  none Pads: No  INTERCOURSE: Pain with intercourse:  slightly but not bad per pt, and not pain felt in hip/low back Ability to have vaginal penetration:  Yes:   Climax: not painful Marinoff Scale: 0/3  PREGNANCY: Vaginal deliveries 0 Tearing No C-section deliveries 0 Currently pregnant Yes:    PROLAPSE: None   OBJECTIVE:    PALPATION:   General TTP in bil gluteals, lumbar                 External Perineal Exam tension and TTP at Lt external pelvic floor                              Internal Pelvic Floor one small trigger point noted at Rt pubococcygeus however majority of pain and tension noted at lt side. Pt reports this is replicating her pain felt with hip/low back. Great tension noted at Lt pubococcygeus, iliococcygeus and bulbocavernosus.    Patient confirms identification and approves PT to assess internal pelvic floor and treatment Yes  PELVIC MMT:   MMT eval  Vaginal Strength Not assessed today due to pain  Internal Anal Sphincter   External Anal Sphincter   Puborectalis   Diastasis Recti   (Blank rows = not tested)        TONE: Increased   PROLAPSE: Not seen in hooklying     OBJECTIVE: (objective measures completed at initial evaluation unless  otherwise dated)  DIAGNOSTIC FINDINGS:  N/A   SCREENING FOR RED FLAGS: Neg   COGNITION: Overall cognitive status: Within functional limits for tasks assessed                          SENSATION: WFL   MUSCLE LENGTH: HS: mild tightness bil Piriformis:WNL   POSTURE: No Significant postural limitations   PALPATION: TTP in bil gluteals, lumbar   LUMBAR ROM: WNL, some tightness with left rotation 25% deficit   LOWER EXTREMITY ROM:   WNL   LOWER EXTREMITY MMT:  *pelvic pain unless noted otherwise   MMT Right eval Left eval  Hip flexion 4+ with pain right LB 5  Hip extension 5 5  Hip abduction 5* 4*r  Hip adduction 5 4+  Hip internal rotation      Hip external rotation      Knee flexion 5 4+  Knee extension 5 4+*  Ankle dorsiflexion 5 5   (Blank rows = not tested)   LUMBAR SPECIAL TESTS:  FABER test: Negative, negative SIJ sompression/distraction   FUNCTIONAL TESTS:  5 times sit to stand: 23.88 sec pelvic/low pain at 6/10   MODIFIED OSWESTRY: TBD     TODAY'S TREATMENT:     10/17/22: Patient consented to internal pelvic floor treatment vaginally this date and found to have continued tension at Lt pubococcygeus Pt reports this is replicating her pain felt with hip/low back. Great tension noted at Lt pubococcygeus, iliococcygeus and bulbocavernosus however less trigger points compared to previous session but tension remains. Good release at end of session Therapeutic exercise: HEP updated and stretches completed and/or reviewed during session 2x30s hip shifting in quad with ant/post and rt/lt rocking Elevated lunge with hip IR  10/10/22: Examination completed, findings reviewed, pt educated on POC, pelvic wand use. Pt motivated to participate in PT and agreeable to attempt recommendations. Manual  for gentle stretching with vaginal internal to decrease tension at muscles mentioned above. Pt tolerated well and reported improved pain levels afterward. KT tape placed by  PT at abdomen x5 for improved abdominal support and decreased pain . Pt tolerated well and educated on how to remove as needed.      Pt educated on pregnancy precautions including but not limited to vaginal bleeding, contractions, leaking of fluid and fetal movement. If any of these occur halt activity and please consult OBGYN for recommended care.                                          PATIENT EDUCATION:  Education details: PT eval findings, anticipated POC, initial HEP, postural awareness, education on log rolling for bed mobility, and discussed acquiring a belly band for support.   Person educated: Patient Education method: Explanation, Demonstration, Verbal cues, and Handouts Education comprehension: verbalized understanding and returned demonstration   HOME EXERCISE PROGRAM:  Access Code: [email or web address] URL: [email or web address] Date: 09/27/2022 Prepared by: Tresa Endo Updated 10/17/22 by Neko Mcgeehan  ASSESSMENT:   CLINICAL IMPRESSION: pt presents for pelvic treatment today, consented to internal pelvic floor treatment. Manual work completed here with improvement noted from previous session but tension still present. Pt tolerated well and reported feeling better at end of session. HEP also updated and reviewed with pt. Pt Pt denied additional questions. Would like to progress out of manual needs and into more strengthening of hips/back and gentle core activations to improve pelvic stability. Willa continues to demonstrate potential for improvement and would benefit from continued skilled therapy to address impairments.    OBJECTIVE IMPAIRMENTS: decreased activity tolerance, decreased ROM, decreased strength, increased muscle spasms, impaired flexibility, and pain.    ACTIVITY LIMITATIONS: sitting, standing, transfers, and locomotion level   PARTICIPATION LIMITATIONS: occupation   PERSONAL FACTORS: Past/current experiences and 1-2 comorbidities: previous LB/pelvic pain and left  knee pain  are also affecting patient's functional outcome.    REHAB POTENTIAL: Excellent   CLINICAL DECISION MAKING: Stable/uncomplicated   EVALUATION COMPLEXITY: Low     GOALS: Goals reviewed with patient? Yes   SHORT TERM GOALS: Target date: 09/14/2022   The patient will demonstrate knowledge of basic self care strategies and exercises to promote healing including log-rolling to get in/out of bed   Baseline: Goal status:MET   2.  The patient will report a 30% improvement in pain levels with functional activities which are currently difficult including sit to stand, turning over in bed, getting in/out of car and walking. Baseline: 40% better (09/21/22) Goal status: MET    3.  Patient to complete Modified Oswestry by 2nd land visit for baseline functional assessment. Baseline:  Goal status: DEFERRED       LONG TERM GOALS: Target date: 10/26/2022   The patient will be independent in a safe self progression of a home exercise program to promote further recovery of function   Baseline:  Goal status: INITIAL   2. The patient will report a 75% improvement in pain levels with functional activities which are currently difficult including bed mobility, sit to stand, getting in/out of car and walking Baseline:  Goal status: INITIAL   3.  The patient will have improved hip flexor and extensor muscle strength to 5/5 needed for lifting medium weight objects and carrying her infant  Baseline:  Goal status: INITIAL   4.  Improved modified Oswestry score  by 12% indicating improved function with less pain Baseline: TBD Goal status: INITIAL     PLAN:   PT FREQUENCY: 3x/week   PT DURATION: 8 weeks   PLANNED INTERVENTIONS: Therapeutic exercises, Therapeutic activity, Neuromuscular re-education, Patient/Family education, Self Care, Joint mobilization, Aquatic Therapy, Spinal mobilization, Cryotherapy, Moist heat, Taping, and Manual therapy.   PLAN FOR NEXT SESSION: core and hip  strength and flexibility, manual therapy as needed, internal pelvic floor work as needed, stretching for birth prep Start working on pregnancy strengthening: thoracic openers, hip openers, Hip IR, gentle TA activation, glute med work, opp lat/glute strengthening, posture strengthening Go over labor positions if able   Otelia Sergeant, PT, DPT 06/11/248:48 AM

## 2022-10-17 NOTE — Therapy (Unsigned)
OUTPATIENT PHYSICAL THERAPY FEMALE PELVIC TREATMENT NOTE   Patient Name: Monique Pruitt MRN: [id number] DOB:1990/08/24, 32 y.o., female Today's Date: 10/18/2022  END OF SESSION:  PT End of Session - 10/18/22 0948     Visit Number 15    Date for PT Re-Evaluation 01/10/23    Authorization Type UHC    PT Start Time 0845    PT Stop Time 0935    PT Time Calculation (min) 50 min    Activity Tolerance Patient tolerated treatment well    Behavior During Therapy [facility] for tasks assessed/performed              Past Medical History:  Diagnosis Date   Depression    Genital HSV    Migraine without aura 03/15/2021   Past Surgical History:  Procedure Laterality Date   WISDOM TOOTH EXTRACTION     Patient Active Problem List   Diagnosis Date Noted   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   Rh negative state in antepartum period 05/31/2022   History of HSV 05/29/2022   Encounter for supervision of normal pregnancy 05/25/2022   PCP: None REFERRING PROVIDER: Lennart Pall, MD   REFERRING DIAG: O99.891,M54.9 (ICD-10-CM) - Back pain affecting pregnancy in second trimester   THERAPY DIAG:  Other muscle spasm  Abnormal posture  Muscle weakness (generalized)  Unspecified lack of coordination  Rationale for Evaluation and Treatment Rehabilitation  PERTINENT HISTORY: H/O LBP and pelvic pain, left knee pain, 24 weeks and 1 day pregnant EDD: 12/20/22, depression  PRECAUTIONS: other, pregnant  SUBJECTIVE:                                                                                                                                                                                      SUBJECTIVE STATEMENT:  Yesterday was a good day, pain around a 3/10. This AM it is a little more, around a 5/10. Getting out of bed is a challenge at the pelvis.  PAIN:  Are you having pain? Yes: NPRS scale: 5/10 Pain location: low back, pelvis LT Pain description: sore Aggravating factors:  sitting too long, standing too long Relieving factors: changing positions, being in the water   PERTINENT HISTORY:  29wks  low-risk pregnancy and has Encounter for supervision of normal pregnancy; History of HSV; Rh negative state in antepartum period; and Choroid plexus cyst   Sexual abuse: No  BOWEL MOVEMENT: Pain with bowel movement: No Type of bowel movement:Type (Bristol Stool Scale) 4, Frequency slightly decreased but at least every 2 days, and Strain No Fully empty rectum: Yes:   Leakage: No Pads: No Fiber supplement: No  URINATION: Pain with urination: No Fully empty  bladder: Yes:   Stream: Strong Urgency: Yes: but with pregnancy Frequency: 2-4 hours Leakage:  none Pads: No  INTERCOURSE: Pain with intercourse:  slightly but not bad per pt, and not pain felt in hip/low back Ability to have vaginal penetration:  Yes:   Climax: not painful Marinoff Scale: 0/3  PREGNANCY: Vaginal deliveries 0 Tearing No C-section deliveries 0 Currently pregnant Yes:    PROLAPSE: None   OBJECTIVE:    PALPATION:   General TTP in bil gluteals, lumbar                 External Perineal Exam tension and TTP at Lt external pelvic floor                              Internal Pelvic Floor one small trigger point noted at Rt pubococcygeus however majority of pain and tension noted at lt side. Pt reports this is replicating her pain felt with hip/low back. Great tension noted at Lt pubococcygeus, iliococcygeus and bulbocavernosus.    Patient confirms identification and approves PT to assess internal pelvic floor and treatment Yes  PELVIC MMT:   MMT eval  Vaginal Strength Not assessed today due to pain  Internal Anal Sphincter   External Anal Sphincter   Puborectalis   Diastasis Recti   (Blank rows = not tested)        TONE: Increased   PROLAPSE: Not seen in hooklying     OBJECTIVE: (objective measures completed at initial evaluation unless otherwise dated)  DIAGNOSTIC  FINDINGS:  N/A   SCREENING FOR RED FLAGS: Neg   COGNITION: Overall cognitive status: Within functional limits for tasks assessed                          SENSATION: WFL   MUSCLE LENGTH: HS: mild tightness bil Piriformis:WNL   POSTURE: No Significant postural limitations   PALPATION: TTP in bil gluteals, lumbar   LUMBAR ROM: WNL, some tightness with left rotation 25% deficit   LOWER EXTREMITY ROM:   WNL   LOWER EXTREMITY MMT:  *pelvic pain unless noted otherwise   MMT Right eval Left eval  Hip flexion 4+ with pain right LB 5  Hip extension 5 5  Hip abduction 5* 4*r  Hip adduction 5 4+  Hip internal rotation      Hip external rotation      Knee flexion 5 4+  Knee extension 5 4+*  Ankle dorsiflexion 5 5   (Blank rows = not tested)   LUMBAR SPECIAL TESTS:  FABER test: Negative, negative SIJ sompression/distraction   FUNCTIONAL TESTS:  5 times sit to stand: 23.88 sec pelvic/low pain at 6/10   MODIFIED OSWESTRY: TBD     TODAY'S TREATMENT:     10/18/22:Pt arrives for aquatic physical therapy. Treatment took place in 3.5-5.5 feet of water. Water temperature was 91 degrees F. Pt entered the pool via stairs independently with use of hand rails.Seated water bench with 75% submersion Pt performed seated LE AROM exercises 20x in all planes, and concurrent discussion of current status and pain.Pt requires buoyancy of water for support and to offload joints with strengthening exercises.  Standing hamstring & lower lumbar/sacral stretching at stairs holding onto railings 3 breaths each hold 3-4x.  75% depth water walking with UE wts for push/pull 6x each direction with intermittent rest breaks, Core/lat press against wall while performing  first term labor  breathing 2x10 and using double buoy UE wts. Seated compression hang 2 min. Posterior pelvic tilt against wall 5 sec hold 10x using first stage labor breathing.  10 min semi reclined decompression to end session and relax the  pelvic floor.        10/17/22: Patient consented to internal pelvic floor treatment vaginally this date and found to have continued tension at Lt pubococcygeus Pt reports this is replicating her pain felt with hip/low back. Great tension noted at Lt pubococcygeus, iliococcygeus and bulbocavernosus however less trigger points compared to previous session but tension remains. Good release at end of session Therapeutic exercise: HEP updated and stretches completed and/or reviewed during session 2x30s hip shifting in quad with ant/post and rt/lt rocking Elevated lunge with hip IR  10/10/22: Examination completed, findings reviewed, pt educated on POC, pelvic wand use. Pt motivated to participate in PT and agreeable to attempt recommendations. Manual for gentle stretching with vaginal internal to decrease tension at muscles mentioned above. Pt tolerated well and reported improved pain levels afterward. KT tape placed by PT at abdomen x5 for improved abdominal support and decreased pain . Pt tolerated well and educated on how to remove as needed.      Pt educated on pregnancy precautions including but not limited to vaginal bleeding, contractions, leaking of fluid and fetal movement. If any of these occur halt activity and please consult OBGYN for recommended care.                                          PATIENT EDUCATION:  Education details: PT eval findings, anticipated POC, initial HEP, postural awareness, education on log rolling for bed mobility, and discussed acquiring a belly band for support.   Person educated: Patient Education method: Explanation, Demonstration, Verbal cues, and Handouts Education comprehension: verbalized understanding and returned demonstration   HOME EXERCISE PROGRAM:  Access Code: [email or web address] URL: [email or web address] Date: 09/27/2022 Prepared by: Tresa Endo Updated 10/17/22 by HALEY  ASSESSMENT:   CLINICAL IMPRESSION: Pt arrives with slightly higher  pain than yesterday and her last PT session. Pt had difficulties getting out of bed this AM and had trouble sleeping from 2-4 AM so she presented a little sleepy. Stretching under water felt good,easier to isolate hamstrings and piriformis but difficult to target her lower sacral area that she wanted to target. No pelvic or back pain during todays session and no pain post decompression/relaxing of pelvic floor hang at end of session.  OBJECTIVE IMPAIRMENTS: decreased activity tolerance, decreased ROM, decreased strength, increased muscle spasms, impaired flexibility, and pain.    ACTIVITY LIMITATIONS: sitting, standing, transfers, and locomotion level   PARTICIPATION LIMITATIONS: occupation   PERSONAL FACTORS: Past/current experiences and 1-2 comorbidities: previous LB/pelvic pain and left knee pain  are also affecting patient's functional outcome.    REHAB POTENTIAL: Excellent   CLINICAL DECISION MAKING: Stable/uncomplicated   EVALUATION COMPLEXITY: Low     GOALS: Goals reviewed with patient? Yes   SHORT TERM GOALS: Target date: 09/14/2022   The patient will demonstrate knowledge of basic self care strategies and exercises to promote healing including log-rolling to get in/out of bed   Baseline: Goal status:MET   2.  The patient will report a 30% improvement in pain levels with functional activities which are currently difficult including sit to stand, turning over in bed, getting in/out of car and walking. Baseline: 40% better (09/21/22) Goal  status: MET    3.  Patient to complete Modified Oswestry by 2nd land visit for baseline functional assessment. Baseline:  Goal status: DEFERRED       LONG TERM GOALS: Target date: 10/26/2022   The patient will be independent in a safe self progression of a home exercise program to promote further recovery of function   Baseline:  Goal status: INITIAL   2. The patient will report a 75% improvement in pain levels with functional activities  which are currently difficult including bed mobility, sit to stand, getting in/out of car and walking Baseline:  Goal status: INITIAL   3.  The patient will have improved hip flexor and extensor muscle strength to 5/5 needed for lifting medium weight objects and carrying her infant  Baseline:  Goal status: INITIAL   4.  Improved modified Oswestry score by 12% indicating improved function with less pain Baseline: TBD Goal status: INITIAL     PLAN:   PT FREQUENCY: 3x/week   PT DURATION: 8 weeks   PLANNED INTERVENTIONS: Therapeutic exercises, Therapeutic activity, Neuromuscular re-education, Patient/Family education, Self Care, Joint mobilization, Aquatic Therapy, Spinal mobilization, Cryotherapy, Moist heat, Taping, and Manual therapy.   PLAN FOR NEXT SESSION: core and hip strength and flexibility, manual therapy as needed, internal pelvic floor work as needed, stretching for birth prep Start working on pregnancy strengthening: thoracic openers, hip openers, Hip IR, gentle TA activation, glute med work, opp lat/glute strengthening, posture strengthening Go over labor positions if able   Ane Payment, PTA 10/18/22 9:57 AM

## 2022-10-18 ENCOUNTER — Ambulatory Visit: Payer: Commercial Managed Care - PPO

## 2022-10-18 ENCOUNTER — Encounter: Payer: Self-pay | Admitting: Physical Therapy

## 2022-10-18 ENCOUNTER — Ambulatory Visit: Payer: Commercial Managed Care - PPO | Admitting: Physical Therapy

## 2022-10-18 DIAGNOSIS — M62838 Other muscle spasm: Secondary | ICD-10-CM | POA: Diagnosis not present

## 2022-10-18 DIAGNOSIS — R293 Abnormal posture: Secondary | ICD-10-CM

## 2022-10-18 DIAGNOSIS — R262 Difficulty in walking, not elsewhere classified: Secondary | ICD-10-CM

## 2022-10-18 DIAGNOSIS — R102 Pelvic and perineal pain: Secondary | ICD-10-CM

## 2022-10-18 DIAGNOSIS — R279 Unspecified lack of coordination: Secondary | ICD-10-CM

## 2022-10-18 DIAGNOSIS — M6281 Muscle weakness (generalized): Secondary | ICD-10-CM

## 2022-10-18 DIAGNOSIS — M549 Dorsalgia, unspecified: Secondary | ICD-10-CM

## 2022-10-19 ENCOUNTER — Ambulatory Visit: Payer: Commercial Managed Care - PPO | Admitting: Physical Therapy

## 2022-10-19 DIAGNOSIS — M62838 Other muscle spasm: Secondary | ICD-10-CM | POA: Diagnosis not present

## 2022-10-19 DIAGNOSIS — R279 Unspecified lack of coordination: Secondary | ICD-10-CM

## 2022-10-19 DIAGNOSIS — R293 Abnormal posture: Secondary | ICD-10-CM

## 2022-10-19 DIAGNOSIS — M6281 Muscle weakness (generalized): Secondary | ICD-10-CM

## 2022-10-19 NOTE — Therapy (Signed)
OUTPATIENT PHYSICAL THERAPY FEMALE PELVIC EVALUATION   Patient Name: Monique Pruitt MRN: [id number] DOB:March 24, 1991, 32 y.o., female Today's Date: 10/19/2022  END OF SESSION:  PT End of Session - 10/19/22 0804     Visit Number 16    Date for PT Re-Evaluation 01/10/23    Authorization Type UHC    PT Start Time 0802    PT Stop Time 0844    PT Time Calculation (min) 42 min    Activity Tolerance Patient tolerated treatment well    Behavior During Therapy [facility] for tasks assessed/performed             Past Medical History:  Diagnosis Date   Depression    Genital HSV    Migraine without aura 03/15/2021   Past Surgical History:  Procedure Laterality Date   WISDOM TOOTH EXTRACTION     Patient Active Problem List   Diagnosis Date Noted   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   Rh negative state in antepartum period 05/31/2022   History of HSV 05/29/2022   Encounter for supervision of normal pregnancy 05/25/2022   PCP: None REFERRING PROVIDER: Lennart Pall, MD   REFERRING DIAG: O99.891,M54.9 (ICD-10-CM) - Back pain affecting pregnancy in second trimester   THERAPY DIAG:  Other muscle spasm  Abnormal posture  Muscle weakness (generalized)  Unspecified lack of coordination  Rationale for Evaluation and Treatment Rehabilitation  PERTINENT HISTORY: H/O LBP and pelvic pain, left knee pain, 24 weeks and 1 day pregnant EDD: 12/20/22, depression  PRECAUTIONS: other, pregnant  SUBJECTIVE:                                                                                                                                                                                      SUBJECTIVE STATEMENT:   Pt reports slightly more pain than last time but did more housework because she was feeling so much better and now is sore.   PAIN:  Are you having pain? Yes: NPRS scale: 5/10 Pain location: low back, pelvis LT Pain description: sore Aggravating factors: sitting too long,  standing too long Relieving factors: changing positions, being in the water   PERTINENT HISTORY:  29wks  low-risk pregnancy and has Encounter for supervision of normal pregnancy; History of HSV; Rh negative state in antepartum period; and Choroid plexus cyst   Sexual abuse: No  BOWEL MOVEMENT: Pain with bowel movement: No Type of bowel movement:Type (Bristol Stool Scale) 4, Frequency slightly decreased but at least every 2 days, and Strain No Fully empty rectum: Yes:   Leakage: No Pads: No Fiber supplement: No  URINATION: Pain with urination: No Fully empty bladder: Yes:   Stream: Strong  Urgency: Yes: but with pregnancy Frequency: 2-4 hours Leakage:  none Pads: No  INTERCOURSE: Pain with intercourse:  slightly but not bad per pt, and not pain felt in hip/low back Ability to have vaginal penetration:  Yes:   Climax: not painful Marinoff Scale: 0/3  PREGNANCY: Vaginal deliveries 0 Tearing No C-section deliveries 0 Currently pregnant Yes:    PROLAPSE: None   OBJECTIVE:    PALPATION:   General TTP in bil gluteals, lumbar                 External Perineal Exam tension and TTP at Lt external pelvic floor                              Internal Pelvic Floor one small trigger point noted at Rt pubococcygeus however majority of pain and tension noted at lt side. Pt reports this is replicating her pain felt with hip/low back. Great tension noted at Lt pubococcygeus, iliococcygeus and bulbocavernosus.    Patient confirms identification and approves PT to assess internal pelvic floor and treatment Yes  PELVIC MMT:   MMT eval  Vaginal Strength Not assessed today due to pain  Internal Anal Sphincter   External Anal Sphincter   Puborectalis   Diastasis Recti   (Blank rows = not tested)        TONE: Increased   PROLAPSE: Not seen in hooklying     OBJECTIVE: (objective measures completed at initial evaluation unless otherwise dated)  DIAGNOSTIC FINDINGS:  N/A    SCREENING FOR RED FLAGS: Neg   COGNITION: Overall cognitive status: Within functional limits for tasks assessed                          SENSATION: WFL   MUSCLE LENGTH: HS: mild tightness bil Piriformis:WNL   POSTURE: No Significant postural limitations   PALPATION: TTP in bil gluteals, lumbar   LUMBAR ROM: WNL, some tightness with left rotation 25% deficit   LOWER EXTREMITY ROM:   WNL   LOWER EXTREMITY MMT:  *pelvic pain unless noted otherwise   MMT Right eval Left eval  Hip flexion 4+ with pain right LB 5  Hip extension 5 5  Hip abduction 5* 4*r  Hip adduction 5 4+  Hip internal rotation      Hip external rotation      Knee flexion 5 4+  Knee extension 5 4+*  Ankle dorsiflexion 5 5   (Blank rows = not tested)   LUMBAR SPECIAL TESTS:  FABER test: Negative, negative SIJ sompression/distraction   FUNCTIONAL TESTS:  5 times sit to stand: 23.88 sec pelvic/low pain at 6/10   MODIFIED OSWESTRY: TBD     TODAY'S TREATMENT:     10/19/22: Patient consented to internal pelvic floor treatment vaginally this date and found to have continued tension at Lt pubococcygeus and slight obturator internus tension. Pt reports this is replicating her pain felt with hip/low back. Pt tolerated well with Good release at end of session and decreased pain felt internally and at hip Taping at abdomen 5 strips with upward pull ~50% for improved abdominal support and decreased pain. Pt reported immediate relief of forward pubic pain and low back pain and feeling a lot better.   10/17/22: Patient consented to internal pelvic floor treatment vaginally this date and found to have continued tension at Lt pubococcygeus Pt reports this is replicating her pain felt with  hip/low back. Great tension noted at Lt pubococcygeus, iliococcygeus and bulbocavernosus however less trigger points compared to previous session but tension remains. Good release at end of session Therapeutic exercise: HEP  updated and stretches completed and/or reviewed during session 2x30s hip shifting in quad with ant/post and rt/lt rocking Elevated lunge with hip IR                     PATIENT EDUCATION:  Education details: PT eval findings, anticipated POC, initial HEP, postural awareness, education on log rolling for bed mobility, and discussed acquiring a belly band for support.   Person educated: Patient Education method: Explanation, Demonstration, Verbal cues, and Handouts Education comprehension: verbalized understanding and returned demonstration   HOME EXERCISE PROGRAM:  Access Code: [email or web address] URL: [email or web address] Date: 09/27/2022 Prepared by: Tresa Endo Updated 10/17/22 by Axle Parfait  ASSESSMENT:   CLINICAL IMPRESSION: pt presents for pelvic treatment today, consented to internal pelvic floor treatment. Manual work completed here with improvement noted from previous session but tension still present. Pt tolerated well and reported feeling better at end of session. Manual with taping also provided, pt understands how to remove and when to remove when tape begins to fray or less support felt or if any irritation or adverse effects noted. plan to progress out of manual needs and into more strengthening of hips/back and gentle core activations to improve pelvic stability. Stacy continues to demonstrate potential for improvement and would benefit from continued skilled therapy to address impairments.    OBJECTIVE IMPAIRMENTS: decreased activity tolerance, decreased ROM, decreased strength, increased muscle spasms, impaired flexibility, and pain.    ACTIVITY LIMITATIONS: sitting, standing, transfers, and locomotion level   PARTICIPATION LIMITATIONS: occupation   PERSONAL FACTORS: Past/current experiences and 1-2 comorbidities: previous LB/pelvic pain and left knee pain  are also affecting patient's functional outcome.    REHAB POTENTIAL: Excellent   CLINICAL DECISION MAKING:  Stable/uncomplicated   EVALUATION COMPLEXITY: Low     GOALS: Goals reviewed with patient? Yes   SHORT TERM GOALS: Target date: 09/14/2022   The patient will demonstrate knowledge of basic self care strategies and exercises to promote healing including log-rolling to get in/out of bed   Baseline: Goal status:MET   2.  The patient will report a 30% improvement in pain levels with functional activities which are currently difficult including sit to stand, turning over in bed, getting in/out of car and walking. Baseline: 40% better (09/21/22) Goal status: MET    3.  Patient to complete Modified Oswestry by 2nd land visit for baseline functional assessment. Baseline:  Goal status: DEFERRED       LONG TERM GOALS: Target date: 10/26/2022   The patient will be independent in a safe self progression of a home exercise program to promote further recovery of function   Baseline:  Goal status: INITIAL   2. The patient will report a 75% improvement in pain levels with functional activities which are currently difficult including bed mobility, sit to stand, getting in/out of car and walking Baseline:  Goal status: INITIAL   3.  The patient will have improved hip flexor and extensor muscle strength to 5/5 needed for lifting medium weight objects and carrying her infant  Baseline:  Goal status: INITIAL   4.  Improved modified Oswestry score by 12% indicating improved function with less pain Baseline: TBD Goal status: INITIAL     PLAN:   PT FREQUENCY: 3x/week   PT DURATION: 8 weeks   PLANNED INTERVENTIONS: Therapeutic exercises, Therapeutic  activity, Neuromuscular re-education, Patient/Family education, Self Care, Joint mobilization, Aquatic Therapy, Spinal mobilization, Cryotherapy, Moist heat, Taping, and Manual therapy.   PLAN FOR NEXT SESSION: core and hip strength and flexibility, manual therapy as needed, internal pelvic floor work as needed, stretching for birth prep Start  working on pregnancy strengthening: thoracic openers, hip openers, Hip IR, gentle TA activation, glute med work, opp lat/glute strengthening, posture strengthening Go over labor positions if able   Otelia Sergeant, PT, DPT 10/18/2408:23 AM

## 2022-10-25 ENCOUNTER — Ambulatory Visit: Payer: Commercial Managed Care - PPO | Admitting: Physical Therapy

## 2022-10-29 NOTE — Progress Notes (Signed)
   PRENATAL VISIT NOTE  Subjective:  Monique Pruitt is a 32 y.o. G1P0000 at [redacted]w[redacted]d being seen today for ongoing prenatal care.  She is currently monitored for the following issues for this low-risk pregnancy and has Encounter for supervision of normal pregnancy; History of HSV; Rh negative state in antepartum period; and Choroid plexus cyst on their problem list.  Patient reports  doing well overall. PFPT is helping with her pain. Severity varies day by day .  Contractions: Not present. Vag. Bleeding: None.  Movement: Present. Denies leaking of fluid.   The following portions of the patient's history were reviewed and updated as appropriate: allergies, current medications, past family history, past medical history, past social history, past surgical history and problem list.   Objective:   Vitals:   10/30/22 0808  BP: 116/76  Pulse: (!) 103  Weight: 167 lb (75.8 kg)    Fetal Status: Fetal Heart Rate (bpm): 142 Fundal Height: 32 cm Movement: Present     General:  Alert, oriented and cooperative. Patient is in no acute distress.  Skin: Skin is warm and dry. No rash noted.   Cardiovascular: Normal heart rate noted  Respiratory: Normal respiratory effort, no problems with respiration noted  Abdomen: Soft, gravid, appropriate for gestational age.  Pain/Pressure: Absent      Assessment and Plan:  Pregnancy: G1P0000 at [redacted]w[redacted]d 1. Encounter for supervision of normal pregnancy in third trimester, unspecified gravidity 2. [redacted] weeks gestation of pregnancy Doing well overall Plans to call pediatrician today Interested in water birth, but no CNM scheduled in [place] for July. Will route message to CNMs to see if a virtual appointment can be arranged.  3. Rh negative state in antepartum period S/p rhogam 6/3  4. History of HSV Suppression at 35-36 weeks  5. Choroid plexus cyst LR NIPS  Preterm labor symptoms and general obstetric precautions including but not limited to vaginal  bleeding, contractions, leaking of fluid and fetal movement were reviewed in detail with the patient. Please refer to After Visit Summary for other counseling recommendations.   Return in about 2 weeks (around 11/13/2022) for return OB at 34 weeks.  Future Appointments  Date Time Provider Department Center  10/31/2022  8:45 AM Barbaraann Faster, PT [facility] None  11/06/2022  9:30 AM Barbaraann Faster, PT [facility] None  11/07/2022  8:45 AM Barbaraann Faster, PT [facility] None  11/08/2022  8:00 AM Manfred Shirts, PTA [facility] None  11/13/2022  8:30 AM Lesly Dukes, MD [facility]  11/14/2022  8:45 AM Barbaraann Faster, PT [facility] None  11/20/2022  8:30 AM Lesly Dukes, MD [facility]  11/21/2022  8:45 AM Barbaraann Faster, PT [facility] None  11/22/2022  8:00 AM Manfred Shirts, PTA [facility] None  11/27/2022  8:30 AM Lesly Dukes, MD [facility]  11/28/2022  8:00 AM Edrick Oh, PT [facility] None  11/29/2022  8:00 AM Manfred Shirts, PTA [facility] None  12/04/2022  8:30 AM Lesly Dukes, MD [facility]  12/06/2022  8:00 AM Barbaraann Faster, PT [facility] None  12/08/2022  1:00 PM Manfred Shirts, PTA [facility] None    Lennart Pall, MD    Lennart Pall, MD

## 2022-10-30 ENCOUNTER — Encounter: Payer: Self-pay | Admitting: Advanced Practice Midwife

## 2022-10-30 ENCOUNTER — Telehealth: Payer: Self-pay | Admitting: Advanced Practice Midwife

## 2022-10-30 ENCOUNTER — Ambulatory Visit (INDEPENDENT_AMBULATORY_CARE_PROVIDER_SITE_OTHER): Payer: Commercial Managed Care - PPO | Admitting: Obstetrics and Gynecology

## 2022-10-30 VITALS — BP 116/76 | HR 103 | Wt 167.0 lb

## 2022-10-30 DIAGNOSIS — Z3A32 32 weeks gestation of pregnancy: Secondary | ICD-10-CM

## 2022-10-30 DIAGNOSIS — Z3493 Encounter for supervision of normal pregnancy, unspecified, third trimester: Secondary | ICD-10-CM

## 2022-10-30 DIAGNOSIS — G930 Cerebral cysts: Secondary | ICD-10-CM

## 2022-10-30 DIAGNOSIS — O26899 Other specified pregnancy related conditions, unspecified trimester: Secondary | ICD-10-CM

## 2022-10-30 DIAGNOSIS — Z6791 Unspecified blood type, Rh negative: Secondary | ICD-10-CM

## 2022-10-30 DIAGNOSIS — Z8619 Personal history of other infectious and parasitic diseases: Secondary | ICD-10-CM

## 2022-10-30 NOTE — Telephone Encounter (Signed)
I called Ms. Staffa and left a message for her to return my call to [facility] (my current location) or to her main office at [facility].  I received the message from Dr Berton Lan that Ms. Shupert is interested in Systems developer. She is 32 weeks and due to schedule constraints, may not be scheduled at the [place] office in person with a midwife before her EDD. I reviewed her medical chart and see no contraindications to waterbirth at this time.  I left a message for the patient to return my call and will discuss waterbirth with her, contraindications, things that can occur up until delivery to prevent water immersion, and options for labor other than immersion (hydrotherapy in the shower, exercise ball, positions, etc) should she risk out of water immersion.  I will also coordinate a future prenatal visit or 2 as virtual visits with a CNM prior to delivery.    The patient returned my call within a few minutes and I discussed her medical history and waterbirth information with her.  She was given the opportunity to ask quesitons.  She is enrolled in the virtual waterbirth class.  I will send her a Mychart message today so she can continue to ask questions for a CNM and I will coordinate at least one additional prenatal visit virtually with a CNM.

## 2022-10-30 NOTE — Patient Instructions (Signed)

## 2022-10-31 ENCOUNTER — Ambulatory Visit: Payer: Commercial Managed Care - PPO | Admitting: Physical Therapy

## 2022-10-31 DIAGNOSIS — M62838 Other muscle spasm: Secondary | ICD-10-CM | POA: Diagnosis not present

## 2022-10-31 DIAGNOSIS — R293 Abnormal posture: Secondary | ICD-10-CM

## 2022-10-31 DIAGNOSIS — R279 Unspecified lack of coordination: Secondary | ICD-10-CM

## 2022-10-31 DIAGNOSIS — M6281 Muscle weakness (generalized): Secondary | ICD-10-CM

## 2022-10-31 NOTE — Therapy (Signed)
OUTPATIENT PHYSICAL THERAPY FEMALE PELVIC EVALUATION   Patient Name: Monique Pruitt MRN: [id number] DOB:06/08/1990, 32 y.o., female Today's Date: 10/31/2022  END OF SESSION:  PT End of Session - 10/31/22 0845     Visit Number 17    Date for PT Re-Evaluation 01/10/23    Authorization Type UHC    PT Start Time 0845    PT Stop Time 0926    PT Time Calculation (min) 41 min    Activity Tolerance Patient tolerated treatment well    Behavior During Therapy [place] for tasks assessed/performed             Past Medical History:  Diagnosis Date   Depression    Genital HSV    Migraine without aura 03/15/2021   Past Surgical History:  Procedure Laterality Date   WISDOM TOOTH EXTRACTION     Patient Active Problem List   Diagnosis Date Noted   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   Rh negative state in antepartum period 05/31/2022   History of HSV 05/29/2022   Encounter for supervision of normal pregnancy 05/25/2022   PCP: None REFERRING PROVIDER: Lennart Pall, MD   REFERRING DIAG: O99.891,M54.9 (ICD-10-CM) - Back pain affecting pregnancy in second trimester   THERAPY DIAG:  Other muscle spasm  Abnormal posture  Muscle weakness (generalized)  Unspecified lack of coordination  Rationale for Evaluation and Treatment Rehabilitation  PERTINENT HISTORY: H/O LBP and pelvic pain, left knee pain, 24 weeks and 1 day pregnant EDD: 12/20/22, depression  PRECAUTIONS: other, pregnant  SUBJECTIVE:                                                                                                                                                                                      SUBJECTIVE STATEMENT:   Feeling a little sore today but rearranged kitchen yesterday. Has been doing really well with pain overall 1/10 usually - did go to beach and thinks this helped.   PAIN:  Are you having pain? Yes: NPRS scale: 4/10 Pain location: low back, pelvis LT Pain description:  sore Aggravating factors: sitting too long, standing too long Relieving factors: changing positions, being in the water   PERTINENT HISTORY:  29wks  low-risk pregnancy and has Encounter for supervision of normal pregnancy; History of HSV; Rh negative state in antepartum period; and Choroid plexus cyst   Sexual abuse: No  BOWEL MOVEMENT: Pain with bowel movement: No Type of bowel movement:Type (Bristol Stool Scale) 4, Frequency slightly decreased but at least every 2 days, and Strain No Fully empty rectum: Yes:   Leakage: No Pads: No Fiber supplement: No  URINATION: Pain with urination: No Fully empty bladder:  Yes:   Stream: Strong Urgency: Yes: but with pregnancy Frequency: 2-4 hours Leakage:  none Pads: No  INTERCOURSE: Pain with intercourse:  slightly but not bad per pt, and not pain felt in hip/low back Ability to have vaginal penetration:  Yes:   Climax: not painful Marinoff Scale: 0/3  PREGNANCY: Vaginal deliveries 0 Tearing No C-section deliveries 0 Currently pregnant Yes:    PROLAPSE: None   OBJECTIVE:    PALPATION:   General TTP in bil gluteals, lumbar                 External Perineal Exam tension and TTP at Lt external pelvic floor                              Internal Pelvic Floor one small trigger point noted at Rt pubococcygeus however majority of pain and tension noted at lt side. Pt reports this is replicating her pain felt with hip/low back. Great tension noted at Lt pubococcygeus, iliococcygeus and bulbocavernosus.    Patient confirms identification and approves PT to assess internal pelvic floor and treatment Yes  PELVIC MMT:   MMT eval  Vaginal Strength Not assessed today due to pain  Internal Anal Sphincter   External Anal Sphincter   Puborectalis   Diastasis Recti   (Blank rows = not tested)        TONE: Increased   PROLAPSE: Not seen in hooklying     OBJECTIVE: (objective measures completed at initial evaluation unless  otherwise dated)  DIAGNOSTIC FINDINGS:  N/A   SCREENING FOR RED FLAGS: Neg   COGNITION: Overall cognitive status: Within functional limits for tasks assessed                          SENSATION: WFL   MUSCLE LENGTH: HS: mild tightness bil Piriformis:WNL   POSTURE: No Significant postural limitations   PALPATION: TTP in bil gluteals, lumbar   LUMBAR ROM: WNL, some tightness with left rotation 25% deficit   LOWER EXTREMITY ROM:   WNL   LOWER EXTREMITY MMT:  *pelvic pain unless noted otherwise   MMT Right eval Left eval  Hip flexion 4+ with pain right LB 5  Hip extension 5 5  Hip abduction 5* 4*r  Hip adduction 5 4+  Hip internal rotation      Hip external rotation      Knee flexion 5 4+  Knee extension 5 4+*  Ankle dorsiflexion 5 5   (Blank rows = not tested)   LUMBAR SPECIAL TESTS:  FABER test: Negative, negative SIJ sompression/distraction   FUNCTIONAL TESTS:  5 times sit to stand: 23.88 sec pelvic/low pain at 6/10   MODIFIED OSWESTRY: TBD     TODAY'S TREATMENT:     10/19/22: Patient consented to internal pelvic floor treatment vaginally this date and found to have continued tension at Lt pubococcygeus and slight obturator internus tension. Pt reports this is replicating her pain felt with hip/low back. Pt tolerated well with Good release at end of session and decreased pain felt internally and at hip. Taping at abdomen 5 strips with upward pull ~50% for improved abdominal support and decreased pain. Pt reported immediate relief of forward pubic pain and low back pain and feeling a lot better.   10/31/22: Patient consented to internal pelvic floor treatment vaginally this date and found to have continued tension at Rt pubococcygeus minimally and improved mobility  on Rt side today. Pt reports this is replicating her pain felt with hip/low back. Pt tolerated well with Good release at end of session and decreased pain felt internally and at hip. Hip shift  stretch 2x1 min bil, then ant/post rocking x10 each side in shift as well Quad ant/post rocking x10  Quad tail wags x10 each 2x10 gentle TA activation with alt standing marching X5 cactus stretching 10s each  PATIENT EDUCATION:  Education details: PT eval findings, anticipated POC, initial HEP, postural awareness, education on log rolling for bed mobility, and discussed acquiring a belly band for support.   Person educated: Patient Education method: Explanation, Demonstration, Verbal cues, and Handouts Education comprehension: verbalized understanding and returned demonstration   HOME EXERCISE PROGRAM:  Access Code: [email or web address] URL: [email or web address] Date: 09/27/2022 Prepared by: Tresa Endo Updated 10/17/22 by Cathyrn Deas  ASSESSMENT:   CLINICAL IMPRESSION: pt presents for pelvic treatment today, consented to internal pelvic floor treatment. Manual work completed here with improvement noted from previous session but very mild tension still present. Pt tolerated well and reported feeling better at end of session. Then focus of pelvic mobility and gentle TA activation to support pelvis during activity with noted decreased pain. Pt does endorse no longer having pain with shifting in standing with activation of TA. Decreased pain at end of session 2/10. Alianah continues to demonstrate potential for improvement and would benefit from continued skilled therapy to address impairments.    OBJECTIVE IMPAIRMENTS: decreased activity tolerance, decreased ROM, decreased strength, increased muscle spasms, impaired flexibility, and pain.    ACTIVITY LIMITATIONS: sitting, standing, transfers, and locomotion level   PARTICIPATION LIMITATIONS: occupation   PERSONAL FACTORS: Past/current experiences and 1-2 comorbidities: previous LB/pelvic pain and left knee pain  are also affecting patient's functional outcome.    REHAB POTENTIAL: Excellent   CLINICAL DECISION MAKING: Stable/uncomplicated    EVALUATION COMPLEXITY: Low     GOALS: Goals reviewed with patient? Yes   SHORT TERM GOALS: Target date: 09/14/2022   The patient will demonstrate knowledge of basic self care strategies and exercises to promote healing including log-rolling to get in/out of bed   Baseline: Goal status:MET   2.  The patient will report a 30% improvement in pain levels with functional activities which are currently difficult including sit to stand, turning over in bed, getting in/out of car and walking. Baseline: 40% better (09/21/22) Goal status: MET    3.  Patient to complete Modified Oswestry by 2nd land visit for baseline functional assessment. Baseline:  Goal status: DEFERRED       LONG TERM GOALS: Target date: 10/26/2022   The patient will be independent in a safe self progression of a home exercise program to promote further recovery of function   Baseline:  Goal status: MET   2. The patient will report a 75% improvement in pain levels with functional activities which are currently difficult including bed mobility, sit to stand, getting in/out of car and walking Baseline:  Goal status: INITIAL   3.  The patient will have improved hip flexor and extensor muscle strength to 5/5 needed for lifting medium weight objects and carrying her infant  Baseline:  Goal status: INITIAL   4.  Improved modified Oswestry score by 12% indicating improved function with less pain Baseline: TBD Goal status: DEFERRED     PLAN:   PT FREQUENCY: 3x/week   PT DURATION: 8 weeks   PLANNED INTERVENTIONS: Therapeutic exercises, Therapeutic activity, Neuromuscular re-education, Patient/Family education, Self Care, Joint mobilization, Aquatic Therapy,  Spinal mobilization, Cryotherapy, Moist heat, Taping, and Manual therapy.   PLAN FOR NEXT SESSION: core and hip strength and flexibility, manual therapy as needed, internal pelvic floor work as needed, stretching for birth prep Start working on pregnancy  strengthening: thoracic openers, hip openers, Hip IR, gentle TA activation, glute med work, opp lat/glute strengthening, posture strengthening Go over labor positions if able   Otelia Sergeant, PT, DPT 06/25/249:42 AM

## 2022-11-02 ENCOUNTER — Encounter: Payer: Self-pay | Admitting: Physical Therapy

## 2022-11-06 ENCOUNTER — Ambulatory Visit: Payer: Commercial Managed Care - PPO | Attending: Obstetrics and Gynecology | Admitting: Physical Therapy

## 2022-11-06 DIAGNOSIS — M549 Dorsalgia, unspecified: Secondary | ICD-10-CM | POA: Diagnosis present

## 2022-11-06 DIAGNOSIS — R293 Abnormal posture: Secondary | ICD-10-CM | POA: Insufficient documentation

## 2022-11-06 DIAGNOSIS — O26892 Other specified pregnancy related conditions, second trimester: Secondary | ICD-10-CM | POA: Diagnosis present

## 2022-11-06 DIAGNOSIS — M62838 Other muscle spasm: Secondary | ICD-10-CM | POA: Insufficient documentation

## 2022-11-06 DIAGNOSIS — R279 Unspecified lack of coordination: Secondary | ICD-10-CM | POA: Insufficient documentation

## 2022-11-06 DIAGNOSIS — O99891 Other specified diseases and conditions complicating pregnancy: Secondary | ICD-10-CM | POA: Diagnosis present

## 2022-11-06 DIAGNOSIS — M6281 Muscle weakness (generalized): Secondary | ICD-10-CM | POA: Insufficient documentation

## 2022-11-06 DIAGNOSIS — R102 Pelvic and perineal pain: Secondary | ICD-10-CM | POA: Insufficient documentation

## 2022-11-06 DIAGNOSIS — R262 Difficulty in walking, not elsewhere classified: Secondary | ICD-10-CM | POA: Diagnosis present

## 2022-11-06 NOTE — Therapy (Signed)
OUTPATIENT PHYSICAL THERAPY FEMALE PELVIC EVALUATION   Patient Name: Monique Pruitt MRN: [id number] DOB:March 23, 1991, 32 y.o., female Today's Date: 11/06/2022  END OF SESSION:  PT End of Session - 11/06/22 0931     Visit Number 18    Date for PT Re-Evaluation 01/10/23    Authorization Type UHC    PT Start Time 0930    PT Stop Time 1014    PT Time Calculation (min) 44 min    Activity Tolerance Patient tolerated treatment well    Behavior During Therapy [facility] for tasks assessed/performed             Past Medical History:  Diagnosis Date   Depression    Genital HSV    Migraine without aura 03/15/2021   Past Surgical History:  Procedure Laterality Date   WISDOM TOOTH EXTRACTION     Patient Active Problem List   Diagnosis Date Noted   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   Rh negative state in antepartum period 05/31/2022   History of HSV 05/29/2022   Encounter for supervision of normal pregnancy 05/25/2022   PCP: None REFERRING PROVIDER: Lennart Pall, MD   REFERRING DIAG: O99.891,M54.9 (ICD-10-CM) - Back pain affecting pregnancy in second trimester   THERAPY DIAG:  Other muscle spasm  Abnormal posture  Muscle weakness (generalized)  Unspecified lack of coordination  Rationale for Evaluation and Treatment Rehabilitation  PERTINENT HISTORY: H/O LBP and pelvic pain, left knee pain, 24 weeks and 1 day pregnant EDD: 12/20/22, depression  PRECAUTIONS: other, pregnant  SUBJECTIVE:                                                                                                                                                                                      SUBJECTIVE STATEMENT:   Overall average of pelvic pain 3/10 highest 5/10 but this is rare now.   PAIN:  Are you having pain? Yes: NPRS scale: 1/10 Pain location: low back, pelvis LT Pain description: sore Aggravating factors: sitting too long, standing too long Relieving factors: changing  positions, being in the water   PERTINENT HISTORY:  29wks  low-risk pregnancy and has Encounter for supervision of normal pregnancy; History of HSV; Rh negative state in antepartum period; and Choroid plexus cyst   Sexual abuse: No  BOWEL MOVEMENT: Pain with bowel movement: No Type of bowel movement:Type (Bristol Stool Scale) 4, Frequency slightly decreased but at least every 2 days, and Strain No Fully empty rectum: Yes:   Leakage: No Pads: No Fiber supplement: No  URINATION: Pain with urination: No Fully empty bladder: Yes:   Stream: Strong Urgency: Yes: but with pregnancy Frequency: 2-4 hours Leakage:  none Pads: No  INTERCOURSE: Pain with intercourse:  slightly but not bad per pt, and not pain felt in hip/low back Ability to have vaginal penetration:  Yes:   Climax: not painful Marinoff Scale: 0/3  PREGNANCY: Vaginal deliveries 0 Tearing No C-section deliveries 0 Currently pregnant Yes:    PROLAPSE: None   OBJECTIVE:    PALPATION:   General TTP in bil gluteals, lumbar                 External Perineal Exam tension and TTP at Lt external pelvic floor                              Internal Pelvic Floor one small trigger point noted at Rt pubococcygeus however majority of pain and tension noted at lt side. Pt reports this is replicating her pain felt with hip/low back. Great tension noted at Lt pubococcygeus, iliococcygeus and bulbocavernosus.    Patient confirms identification and approves PT to assess internal pelvic floor and treatment Yes  PELVIC MMT:   MMT eval  Vaginal Strength Not assessed today due to pain  Internal Anal Sphincter   External Anal Sphincter   Puborectalis   Diastasis Recti   (Blank rows = not tested)        TONE: Increased   PROLAPSE: Not seen in hooklying     OBJECTIVE: (objective measures completed at initial evaluation unless otherwise dated)  DIAGNOSTIC FINDINGS:  N/A   SCREENING FOR RED FLAGS: Neg    COGNITION: Overall cognitive status: Within functional limits for tasks assessed                          SENSATION: WFL   MUSCLE LENGTH: HS: mild tightness bil Piriformis:WNL   POSTURE: No Significant postural limitations   PALPATION: TTP in bil gluteals, lumbar   LUMBAR ROM: WNL, some tightness with left rotation 25% deficit   LOWER EXTREMITY ROM:   WNL   LOWER EXTREMITY MMT:  *pelvic pain unless noted otherwise   MMT Right eval Left eval  Hip flexion 4+ with pain right LB 5  Hip extension 5 5  Hip abduction 5* 4*r  Hip adduction 5 4+  Hip internal rotation      Hip external rotation      Knee flexion 5 4+  Knee extension 5 4+*  Ankle dorsiflexion 5 5   (Blank rows = not tested)   LUMBAR SPECIAL TESTS:  FABER test: Negative, negative SIJ sompression/distraction   FUNCTIONAL TESTS:  5 times sit to stand: 23.88 sec pelvic/low pain at 6/10   MODIFIED OSWESTRY: TBD     TODAY'S TREATMENT:     10/19/22: Patient consented to internal pelvic floor treatment vaginally this date and found to have continued tension at Lt pubococcygeus and slight obturator internus tension. Pt reports this is replicating her pain felt with hip/low back. Pt tolerated well with Good release at end of session and decreased pain felt internally and at hip. Taping at abdomen 5 strips with upward pull ~50% for improved abdominal support and decreased pain. Pt reported immediate relief of forward pubic pain and low back pain and feeling a lot better.   10/31/22: Patient consented to internal pelvic floor treatment vaginally this date and found to have continued tension at Rt pubococcygeus minimally and improved mobility on Rt side today. Pt reports this is replicating her pain felt with hip/low back.  Pt tolerated well with Good release at end of session and decreased pain felt internally and at hip. Hip shift stretch 2x1 min bil, then ant/post rocking x10 each side in shift as well Quad ant/post  rocking x10  Quad tail wags x10 each 2x10 gentle TA activation with alt standing marching X5 cactus stretching 10s each  11/06/22: Therapeutic exercise: Pelvic tilts x10 on yoga ball Pelvic drops 2x10 on yoga ball Pelvic circles x10 Rt/Lt on yoga ball Hip shifts x10 with toes neutral, toes in, and toes out all with adductor squeeze Hip flexor stretch 2x30s each in standing Ant/post quad rocks x10 with hip IR ITB stretch 2x30s each Half kneel diagonal 4# reaches x10 each Manual: Taping at abdomen 5 strips with upward pull ~50% for improved abdominal support and decreased pain. Pt reported immediate relief of forward pubic pain and low back pain and feeling a lot better.    PATIENT EDUCATION:  Education details: PT eval findings, anticipated POC, HEP, postural awareness, education on log rolling for bed mobility, and discussed acquiring a belly band for support.   Person educated: Patient Education method: Explanation, Demonstration, Verbal cues, and Handouts Education comprehension: verbalized understanding and returned demonstration   HOME EXERCISE PROGRAM:  Access Code: [email or web address] URL: [email or web address] Date: 09/27/2022 Prepared by: Tresa Endo Updated 10/17/22 by Jaquisha Frech  ASSESSMENT:   CLINICAL IMPRESSION: pt presents for pelvic treatment today,session focused on mobility at hips and pelvic stability with gentle strengthening at core and pelvic girdle. Pt tolerated well denied increase in pain and reported Decreased pain at end of session 2/10. Jasibe continues to demonstrate potential for improvement and would benefit from continued skilled therapy to address impairments.    OBJECTIVE IMPAIRMENTS: decreased activity tolerance, decreased ROM, decreased strength, increased muscle spasms, impaired flexibility, and pain.    ACTIVITY LIMITATIONS: sitting, standing, transfers, and locomotion level   PARTICIPATION LIMITATIONS: occupation   PERSONAL FACTORS:  Past/current experiences and 1-2 comorbidities: previous LB/pelvic pain and left knee pain  are also affecting patient's functional outcome.    REHAB POTENTIAL: Excellent   CLINICAL DECISION MAKING: Stable/uncomplicated   EVALUATION COMPLEXITY: Low     GOALS: Goals reviewed with patient? Yes   SHORT TERM GOALS: Target date: 09/14/2022   The patient will demonstrate knowledge of basic self care strategies and exercises to promote healing including log-rolling to get in/out of bed   Baseline: Goal status:MET   2.  The patient will report a 30% improvement in pain levels with functional activities which are currently difficult including sit to stand, turning over in bed, getting in/out of car and walking. Baseline: 40% better (09/21/22) Goal status: MET    3.  Patient to complete Modified Oswestry by 2nd land visit for baseline functional assessment. Baseline:  Goal status: DEFERRED       LONG TERM GOALS: Target date: 10/26/2022   The patient will be independent in a safe self progression of a home exercise program to promote further recovery of function   Baseline:  Goal status: MET   2. The patient will report a 75% improvement in pain levels with functional activities which are currently difficult including bed mobility, sit to stand, getting in/out of car and walking Baseline:  Goal status: INITIAL   3.  The patient will have improved hip flexor and extensor muscle strength to 5/5 needed for lifting medium weight objects and carrying her infant  Baseline:  Goal status: INITIAL   4.  Improved modified Oswestry score by 12% indicating improved function  with less pain Baseline: TBD Goal status: DEFERRED     PLAN:   PT FREQUENCY: 3x/week   PT DURATION: 8 weeks   PLANNED INTERVENTIONS: Therapeutic exercises, Therapeutic activity, Neuromuscular re-education, Patient/Family education, Self Care, Joint mobilization, Aquatic Therapy, Spinal mobilization, Cryotherapy, Moist  heat, Taping, and Manual therapy.   PLAN FOR NEXT SESSION: core and hip strength and flexibility, manual therapy as needed, internal pelvic floor work as needed, stretching for birth prep Start working on pregnancy strengthening: thoracic openers, hip openers, Hip IR, gentle TA activation, glute med work, opp lat/glute strengthening, posture strengthening Go over labor positions if able   Otelia Sergeant, PT, DPT 11/05/2408:14 AM

## 2022-11-07 ENCOUNTER — Ambulatory Visit: Payer: Commercial Managed Care - PPO | Admitting: Physical Therapy

## 2022-11-07 DIAGNOSIS — M62838 Other muscle spasm: Secondary | ICD-10-CM

## 2022-11-07 DIAGNOSIS — M6281 Muscle weakness (generalized): Secondary | ICD-10-CM | POA: Diagnosis not present

## 2022-11-07 DIAGNOSIS — R293 Abnormal posture: Secondary | ICD-10-CM

## 2022-11-07 DIAGNOSIS — R279 Unspecified lack of coordination: Secondary | ICD-10-CM

## 2022-11-07 NOTE — Therapy (Unsigned)
OUTPATIENT PHYSICAL THERAPY FEMALE PELVIC EVALUATION   Patient Name: Monique Pruitt MRN: [id number] DOB:08-19-1990, 32 y.o., female Today's Date: 11/07/2022  END OF SESSION:  PT End of Session - 11/07/22 0846     Visit Number 19    Date for PT Re-Evaluation 01/10/23    Authorization Type [employer]    PT Start Time 0845    PT Stop Time 0930    PT Time Calculation (min) 45 min    Activity Tolerance Patient tolerated treatment well    Behavior During Therapy [facility] for tasks assessed/performed             Past Medical History:  Diagnosis Date   Depression    Genital HSV    Migraine without aura 03/15/2021   Past Surgical History:  Procedure Laterality Date   WISDOM TOOTH EXTRACTION     Patient Active Problem List   Diagnosis Date Noted   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   Rh negative state in antepartum period 05/31/2022   History of HSV 05/29/2022   Encounter for supervision of normal pregnancy 05/25/2022   PCP: None REFERRING PROVIDER: Lennart Pall, MD   REFERRING DIAG: O99.891,M54.9 (ICD-10-CM) - Back pain affecting pregnancy in second trimester   THERAPY DIAG:  Other muscle spasm  Abnormal posture  Muscle weakness (generalized)  Unspecified lack of coordination  Rationale for Evaluation and Treatment Rehabilitation  PERTINENT HISTORY: H/O LBP and pelvic pain, left knee pain, 24 weeks and 1 day pregnant EDD: 12/20/22, depression  PRECAUTIONS: other, pregnant  SUBJECTIVE:                                                                                                                                                                                      SUBJECTIVE STATEMENT:     PAIN:  Are you having pain? Yes: NPRS scale: 2/10 Pain location: low back, pelvis LT Pain description: sore Aggravating factors: sitting too long, standing too long Relieving factors: changing positions, being in the water   PERTINENT HISTORY:  29wks  low-risk  pregnancy and has Encounter for supervision of normal pregnancy; History of HSV; Rh negative state in antepartum period; and Choroid plexus cyst   Sexual abuse: No  BOWEL MOVEMENT: Pain with bowel movement: No Type of bowel movement:Type (Bristol Stool Scale) 4, Frequency slightly decreased but at least every 2 days, and Strain No Fully empty rectum: Yes:   Leakage: No Pads: No Fiber supplement: No  URINATION: Pain with urination: No Fully empty bladder: Yes:   Stream: Strong Urgency: Yes: but with pregnancy Frequency: 2-4 hours Leakage:  none Pads: No  INTERCOURSE: Pain with intercourse:  slightly but not bad  per pt, and not pain felt in hip/low back Ability to have vaginal penetration:  Yes:   Climax: not painful Marinoff Scale: 0/3  PREGNANCY: Vaginal deliveries 0 Tearing No C-section deliveries 0 Currently pregnant Yes:    PROLAPSE: None   OBJECTIVE:    PALPATION:   General TTP in bil gluteals, lumbar                 External Perineal Exam tension and TTP at Lt external pelvic floor                              Internal Pelvic Floor one small trigger point noted at Rt pubococcygeus however majority of pain and tension noted at lt side. Pt reports this is replicating her pain felt with hip/low back. Great tension noted at Lt pubococcygeus, iliococcygeus and bulbocavernosus.    Patient confirms identification and approves PT to assess internal pelvic floor and treatment Yes  PELVIC MMT:   MMT eval  Vaginal Strength Not assessed today due to pain  Internal Anal Sphincter   External Anal Sphincter   Puborectalis   Diastasis Recti   (Blank rows = not tested)        TONE: Increased   PROLAPSE: Not seen in hooklying     OBJECTIVE: (objective measures completed at initial evaluation unless otherwise dated)  DIAGNOSTIC FINDINGS:  N/A   SCREENING FOR RED FLAGS: Neg   COGNITION: Overall cognitive status: Within functional limits for tasks assessed                           SENSATION: WFL   MUSCLE LENGTH: HS: mild tightness bil Piriformis:WNL   POSTURE: No Significant postural limitations   PALPATION: TTP in bil gluteals, lumbar   LUMBAR ROM: WNL, some tightness with left rotation 25% deficit   LOWER EXTREMITY ROM:   WNL   LOWER EXTREMITY MMT:  *pelvic pain unless noted otherwise   MMT Right eval Left eval  Hip flexion 4+ with pain right LB 5  Hip extension 5 5  Hip abduction 5* 4*r  Hip adduction 5 4+  Hip internal rotation      Hip external rotation      Knee flexion 5 4+  Knee extension 5 4+*  Ankle dorsiflexion 5 5   (Blank rows = not tested)   LUMBAR SPECIAL TESTS:  FABER test: Negative, negative SIJ sompression/distraction   FUNCTIONAL TESTS:  5 times sit to stand: 23.88 sec pelvic/low pain at 6/10   MODIFIED OSWESTRY: TBD     TODAY'S TREATMENT:     11/08/22:Pt arrives for aquatic physical therapy. Treatment took place in 3.5-5.5 feet of water. Water temperature was 90 degrees F. Pt entered the pool via stairs independently with use of rails. Pt requires buoyancy of water for support and to offload joints with strengthening exercises.  Pt utilizes viscosity of the water required for strengthening.  Seated water bench with 75% submersion Pt performed seated LE AROM exercises 20x in all planes with concurrent discussion of current status.  11/07/22  Patient consented to internal pelvic floor treatment vaginally this date and found to have Lt pubococcygeus tenderness but not trigger points, did endorse this was replicated Lt pelvic pain/discomfort and resolved with manual work here. Pt tolerated well, cued for diaphragmatic breathing throughout for pelvic floor relaxation with good effect.  Pt also educated on labor positions for comfort, educated  these are not a substitute for medical advice and this should be followed during labor for safety pt agreed. Pt also educated these are options for comfort only and  again should be completed with medical agreement during time of labor.    PATIENT EDUCATION:  Education details: PT eval findings, anticipated POC, HEP, postural awareness, education on log rolling for bed mobility, and discussed acquiring a belly band for support.   Person educated: Patient Education method: Explanation, Demonstration, Verbal cues, and Handouts Education comprehension: verbalized understanding and returned demonstration   HOME EXERCISE PROGRAM:  Access Code: [email or web address] URL: [email or web address] Date: 09/27/2022 Prepared by: Tresa Endo Updated 10/17/22 by HALEY  ASSESSMENT:   CLINICAL IMPRESSION:   OBJECTIVE IMPAIRMENTS: decreased activity tolerance, decreased ROM, decreased strength, increased muscle spasms, impaired flexibility, and pain.    ACTIVITY LIMITATIONS: sitting, standing, transfers, and locomotion level   PARTICIPATION LIMITATIONS: occupation   PERSONAL FACTORS: Past/current experiences and 1-2 comorbidities: previous LB/pelvic pain and left knee pain  are also affecting patient's functional outcome.    REHAB POTENTIAL: Excellent   CLINICAL DECISION MAKING: Stable/uncomplicated   EVALUATION COMPLEXITY: Low     GOALS: Goals reviewed with patient? Yes   SHORT TERM GOALS: Target date: 09/14/2022   The patient will demonstrate knowledge of basic self care strategies and exercises to promote healing including log-rolling to get in/out of bed   Baseline: Goal status:MET   2.  The patient will report a 30% improvement in pain levels with functional activities which are currently difficult including sit to stand, turning over in bed, getting in/out of car and walking. Baseline: 40% better (09/21/22) Goal status: MET    3.  Patient to complete Modified Oswestry by 2nd land visit for baseline functional assessment. Baseline:  Goal status: DEFERRED       LONG TERM GOALS: Target date: 10/26/2022   The patient will be independent in a safe  self progression of a home exercise program to promote further recovery of function   Baseline:  Goal status: MET   2. The patient will report a 75% improvement in pain levels with functional activities which are currently difficult including bed mobility, sit to stand, getting in/out of car and walking Baseline:  Goal status: INITIAL   3.  The patient will have improved hip flexor and extensor muscle strength to 5/5 needed for lifting medium weight objects and carrying her infant  Baseline:  Goal status: INITIAL   4.  Improved modified Oswestry score by 12% indicating improved function with less pain Baseline: TBD Goal status: DEFERRED     PLAN:   PT FREQUENCY: 3x/week   PT DURATION: 8 weeks   PLANNED INTERVENTIONS: Therapeutic exercises, Therapeutic activity, Neuromuscular re-education, Patient/Family education, Self Care, Joint mobilization, Aquatic Therapy, Spinal mobilization, Cryotherapy, Moist heat, Taping, and Manual therapy.   PLAN FOR NEXT SESSION: core and hip strength and flexibility, manual therapy as needed, internal pelvic floor work as needed, stretching for birth prep Start working on pregnancy strengthening: thoracic openers, hip openers, Hip IR, gentle TA activation, glute med work, opp lat/glute strengthening, posture strengthening Go over labor positions if able   Ane Payment, PTA 11/07/22 6:25 PM

## 2022-11-07 NOTE — Therapy (Signed)
OUTPATIENT PHYSICAL THERAPY FEMALE PELVIC EVALUATION   Patient Name: Markel Baza MRN: [id number] DOB:Apr 16, 1991, 32 y.o., female Today's Date: 11/07/2022  END OF SESSION:  PT End of Session - 11/07/22 0846     Visit Number 19    Date for PT Re-Evaluation 01/10/23    Authorization Type UHC    PT Start Time 0845    PT Stop Time 0930    PT Time Calculation (min) 45 min    Activity Tolerance Patient tolerated treatment well    Behavior During Therapy [facility] for tasks assessed/performed             Past Medical History:  Diagnosis Date   Depression    Genital HSV    Migraine without aura 03/15/2021   Past Surgical History:  Procedure Laterality Date   WISDOM TOOTH EXTRACTION     Patient Active Problem List   Diagnosis Date Noted   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   Rh negative state in antepartum period 05/31/2022   History of HSV 05/29/2022   Encounter for supervision of normal pregnancy 05/25/2022   PCP: None REFERRING PROVIDER: Lennart Pall, MD   REFERRING DIAG: O99.891,M54.9 (ICD-10-CM) - Back pain affecting pregnancy in second trimester   THERAPY DIAG:  Other muscle spasm  Abnormal posture  Muscle weakness (generalized)  Unspecified lack of coordination  Rationale for Evaluation and Treatment Rehabilitation  PERTINENT HISTORY: H/O LBP and pelvic pain, left knee pain, 24 weeks and 1 day pregnant EDD: 12/20/22, depression  PRECAUTIONS: other, pregnant  SUBJECTIVE:                                                                                                                                                                                      SUBJECTIVE STATEMENT:   Felt a lot better after yesterday's treatment and able to clean out closet and then started having a little low back pain. However no increase in pelvic pain this is doing better, and reports now pain is mostly Lt medial pelvic pain only.   PAIN:  Are you having pain? Yes: NPRS  scale: 2/10 Pain location: low back, pelvis LT Pain description: sore Aggravating factors: sitting too long, standing too long Relieving factors: changing positions, being in the water   PERTINENT HISTORY:  29wks  low-risk pregnancy and has Encounter for supervision of normal pregnancy; History of HSV; Rh negative state in antepartum period; and Choroid plexus cyst   Sexual abuse: No  BOWEL MOVEMENT: Pain with bowel movement: No Type of bowel movement:Type (Bristol Stool Scale) 4, Frequency slightly decreased but at least every 2 days, and Strain No Fully empty rectum: Yes:   Leakage:  No Pads: No Fiber supplement: No  URINATION: Pain with urination: No Fully empty bladder: Yes:   Stream: Strong Urgency: Yes: but with pregnancy Frequency: 2-4 hours Leakage:  none Pads: No  INTERCOURSE: Pain with intercourse:  slightly but not bad per pt, and not pain felt in hip/low back Ability to have vaginal penetration:  Yes:   Climax: not painful Marinoff Scale: 0/3  PREGNANCY: Vaginal deliveries 0 Tearing No C-section deliveries 0 Currently pregnant Yes:    PROLAPSE: None   OBJECTIVE:    PALPATION:   General TTP in bil gluteals, lumbar                 External Perineal Exam tension and TTP at Lt external pelvic floor                              Internal Pelvic Floor one small trigger point noted at Rt pubococcygeus however majority of pain and tension noted at lt side. Pt reports this is replicating her pain felt with hip/low back. Great tension noted at Lt pubococcygeus, iliococcygeus and bulbocavernosus.    Patient confirms identification and approves PT to assess internal pelvic floor and treatment Yes  PELVIC MMT:   MMT eval  Vaginal Strength Not assessed today due to pain  Internal Anal Sphincter   External Anal Sphincter   Puborectalis   Diastasis Recti   (Blank rows = not tested)        TONE: Increased   PROLAPSE: Not seen in hooklying      OBJECTIVE: (objective measures completed at initial evaluation unless otherwise dated)  DIAGNOSTIC FINDINGS:  N/A   SCREENING FOR RED FLAGS: Neg   COGNITION: Overall cognitive status: Within functional limits for tasks assessed                          SENSATION: WFL   MUSCLE LENGTH: HS: mild tightness bil Piriformis:WNL   POSTURE: No Significant postural limitations   PALPATION: TTP in bil gluteals, lumbar   LUMBAR ROM: WNL, some tightness with left rotation 25% deficit   LOWER EXTREMITY ROM:   WNL   LOWER EXTREMITY MMT:  *pelvic pain unless noted otherwise   MMT Right eval Left eval  Hip flexion 4+ with pain right LB 5  Hip extension 5 5  Hip abduction 5* 4*r  Hip adduction 5 4+  Hip internal rotation      Hip external rotation      Knee flexion 5 4+  Knee extension 5 4+*  Ankle dorsiflexion 5 5   (Blank rows = not tested)   LUMBAR SPECIAL TESTS:  FABER test: Negative, negative SIJ sompression/distraction   FUNCTIONAL TESTS:  5 times sit to stand: 23.88 sec pelvic/low pain at 6/10   MODIFIED OSWESTRY: TBD     TODAY'S TREATMENT:    11/07/22  Patient consented to internal pelvic floor treatment vaginally this date and found to have Lt pubococcygeus tenderness but not trigger points, did endorse this was replicated Lt pelvic pain/discomfort and resolved with manual work here. Pt tolerated well, cued for diaphragmatic breathing throughout for pelvic floor relaxation with good effect.  Pt also educated on labor positions for comfort, educated these are not a substitute for medical advice and this should be followed during labor for safety pt agreed. Pt also educated these are options for comfort only and again should be completed with medical agreement during  time of labor.    PATIENT EDUCATION:  Education details: PT eval findings, anticipated POC, HEP, postural awareness, education on log rolling for bed mobility, and discussed acquiring a belly band  for support.   Person educated: Patient Education method: Explanation, Demonstration, Verbal cues, and Handouts Education comprehension: verbalized understanding and returned demonstration   HOME EXERCISE PROGRAM:  Access Code: [email or web address] URL: [email or web address] Date: 09/27/2022 Prepared by: Tresa Endo Updated 10/17/22 by Kahla Risdon  ASSESSMENT:   CLINICAL IMPRESSION: pt presents for pelvic treatment today,session focused on internal vaginal treatment for pelvic floor relaxation and improved mobility at Lt side pelvic floor pelvic floor. With good effect. Pt tolerated well and reported no pain at end of session. Pt reports she has noted improvement between sessions as well and having less pain with more activity. Pt also educated on comfort positions during labor. Denied questions and understands medical recommendations should be followed. Evania continues to demonstrate potential for improvement and would benefit from continued skilled therapy to address impairments.    OBJECTIVE IMPAIRMENTS: decreased activity tolerance, decreased ROM, decreased strength, increased muscle spasms, impaired flexibility, and pain.    ACTIVITY LIMITATIONS: sitting, standing, transfers, and locomotion level   PARTICIPATION LIMITATIONS: occupation   PERSONAL FACTORS: Past/current experiences and 1-2 comorbidities: previous LB/pelvic pain and left knee pain  are also affecting patient's functional outcome.    REHAB POTENTIAL: Excellent   CLINICAL DECISION MAKING: Stable/uncomplicated   EVALUATION COMPLEXITY: Low     GOALS: Goals reviewed with patient? Yes   SHORT TERM GOALS: Target date: 09/14/2022   The patient will demonstrate knowledge of basic self care strategies and exercises to promote healing including log-rolling to get in/out of bed   Baseline: Goal status:MET   2.  The patient will report a 30% improvement in pain levels with functional activities which are currently difficult  including sit to stand, turning over in bed, getting in/out of car and walking. Baseline: 40% better (09/21/22) Goal status: MET    3.  Patient to complete Modified Oswestry by 2nd land visit for baseline functional assessment. Baseline:  Goal status: DEFERRED       LONG TERM GOALS: Target date: 10/26/2022   The patient will be independent in a safe self progression of a home exercise program to promote further recovery of function   Baseline:  Goal status: MET   2. The patient will report a 75% improvement in pain levels with functional activities which are currently difficult including bed mobility, sit to stand, getting in/out of car and walking Baseline:  Goal status: INITIAL   3.  The patient will have improved hip flexor and extensor muscle strength to 5/5 needed for lifting medium weight objects and carrying her infant  Baseline:  Goal status: INITIAL   4.  Improved modified Oswestry score by 12% indicating improved function with less pain Baseline: TBD Goal status: DEFERRED     PLAN:   PT FREQUENCY: 3x/week   PT DURATION: 8 weeks   PLANNED INTERVENTIONS: Therapeutic exercises, Therapeutic activity, Neuromuscular re-education, Patient/Family education, Self Care, Joint mobilization, Aquatic Therapy, Spinal mobilization, Cryotherapy, Moist heat, Taping, and Manual therapy.   PLAN FOR NEXT SESSION: core and hip strength and flexibility, manual therapy as needed, internal pelvic floor work as needed, stretching for birth prep Start working on pregnancy strengthening: thoracic openers, hip openers, Hip IR, gentle TA activation, glute med work, opp lat/glute strengthening, posture strengthening Go over labor positions if able   Otelia Sergeant, PT, DPT 07/02/249:34 AM

## 2022-11-08 ENCOUNTER — Encounter: Payer: Self-pay | Admitting: Physical Therapy

## 2022-11-08 ENCOUNTER — Ambulatory Visit: Payer: Commercial Managed Care - PPO | Admitting: Physical Therapy

## 2022-11-08 DIAGNOSIS — M62838 Other muscle spasm: Secondary | ICD-10-CM

## 2022-11-08 DIAGNOSIS — M6281 Muscle weakness (generalized): Secondary | ICD-10-CM | POA: Diagnosis not present

## 2022-11-08 DIAGNOSIS — O99891 Other specified diseases and conditions complicating pregnancy: Secondary | ICD-10-CM

## 2022-11-08 DIAGNOSIS — R279 Unspecified lack of coordination: Secondary | ICD-10-CM

## 2022-11-08 DIAGNOSIS — R262 Difficulty in walking, not elsewhere classified: Secondary | ICD-10-CM

## 2022-11-08 DIAGNOSIS — R293 Abnormal posture: Secondary | ICD-10-CM

## 2022-11-08 DIAGNOSIS — O26892 Other specified pregnancy related conditions, second trimester: Secondary | ICD-10-CM

## 2022-11-13 ENCOUNTER — Encounter: Payer: Commercial Managed Care - PPO | Admitting: Obstetrics & Gynecology

## 2022-11-14 ENCOUNTER — Ambulatory Visit: Payer: Commercial Managed Care - PPO | Admitting: Physical Therapy

## 2022-11-14 DIAGNOSIS — M62838 Other muscle spasm: Secondary | ICD-10-CM

## 2022-11-14 DIAGNOSIS — R293 Abnormal posture: Secondary | ICD-10-CM

## 2022-11-14 DIAGNOSIS — M6281 Muscle weakness (generalized): Secondary | ICD-10-CM | POA: Diagnosis not present

## 2022-11-14 NOTE — Therapy (Signed)
OUTPATIENT PHYSICAL THERAPY FEMALE PELVIC EVALUATION   Patient Name: Monique Pruitt MRN: [id number] DOB:Jan 21, 1991, 32 y.o., female Today's Date: 11/14/2022  END OF SESSION:  PT End of Session - 11/14/22 0849     Visit Number 21    Date for PT Re-Evaluation 01/10/23    Authorization Type [employer]    PT Start Time 0847    PT Stop Time 0930    PT Time Calculation (min) 43 min    Activity Tolerance Patient tolerated treatment well    Behavior During Therapy [facility] for tasks assessed/performed             Past Medical History:  Diagnosis Date   Depression    Genital HSV    Migraine without aura 03/15/2021   Past Surgical History:  Procedure Laterality Date   WISDOM TOOTH EXTRACTION     Patient Active Problem List   Diagnosis Date Noted   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   Rh negative state in antepartum period 05/31/2022   History of HSV 05/29/2022   Encounter for supervision of normal pregnancy 05/25/2022   PCP: None REFERRING PROVIDER: Lennart Pall, MD   REFERRING DIAG: O99.891,M54.9 (ICD-10-CM) - Back pain affecting pregnancy in second trimester   THERAPY DIAG:  Other muscle spasm  Abnormal posture  Muscle weakness (generalized)  Unspecified lack of coordination  Rationale for Evaluation and Treatment Rehabilitation  PERTINENT HISTORY: H/O LBP and pelvic pain, left knee pain, 24 weeks and 1 day pregnant EDD: 12/20/22, depression  PRECAUTIONS: other, pregnant  SUBJECTIVE:                                                                                                                                                                                      SUBJECTIVE STATEMENT:   Pt reports pain has been much better overall, highest 2/10. Pt does endorse tailbone pain has recently been the most bothersome with prolonged activity this highest is 4/10.    PAIN:  Are you having pain? NPRS scale: 0/10 sitting maybe 3/10 with walking at tailbone Pain  location:  Pain description:  Aggravating factors:  Relieving factors:   PERTINENT HISTORY:  29wks  low-risk pregnancy and has Encounter for supervision of normal pregnancy; History of HSV; Rh negative state in antepartum period; and Choroid plexus cyst   Sexual abuse: No  BOWEL MOVEMENT: Pain with bowel movement: No Type of bowel movement:Type (Bristol Stool Scale) 4, Frequency slightly decreased but at least every 2 days, and Strain No Fully empty rectum: Yes:   Leakage: No Pads: No Fiber supplement: No  URINATION: Pain with urination: No Fully empty bladder: Yes:   Stream: Strong Urgency: Yes:  but with pregnancy Frequency: 2-4 hours Leakage:  none Pads: No  INTERCOURSE: Pain with intercourse:  slightly but not bad per pt, and not pain felt in hip/low back Ability to have vaginal penetration:  Yes:   Climax: not painful Marinoff Scale: 0/3  PREGNANCY: Vaginal deliveries 0 Tearing No C-section deliveries 0 Currently pregnant Yes:    PROLAPSE: None   OBJECTIVE:    PALPATION:   General TTP in bil gluteals, lumbar                 External Perineal Exam tension and TTP at Lt external pelvic floor                              Internal Pelvic Floor one small trigger point noted at Rt pubococcygeus however majority of pain and tension noted at lt side. Pt reports this is replicating her pain felt with hip/low back. Great tension noted at Lt pubococcygeus, iliococcygeus and bulbocavernosus.    Patient confirms identification and approves PT to assess internal pelvic floor and treatment Yes  PELVIC MMT:   MMT eval  Vaginal Strength Not assessed today due to pain  Internal Anal Sphincter   External Anal Sphincter   Puborectalis   Diastasis Recti   (Blank rows = not tested)        TONE: Increased   PROLAPSE: Not seen in hooklying     OBJECTIVE: (objective measures completed at initial evaluation unless otherwise dated)  DIAGNOSTIC FINDINGS:  N/A    SCREENING FOR RED FLAGS: Neg   COGNITION: Overall cognitive status: Within functional limits for tasks assessed                          SENSATION: WFL   MUSCLE LENGTH: HS: mild tightness bil Piriformis:WNL   POSTURE: No Significant postural limitations   PALPATION: TTP in bil gluteals, lumbar   LUMBAR ROM: WNL, some tightness with left rotation 25% deficit   LOWER EXTREMITY ROM:   WNL   LOWER EXTREMITY MMT:  *pelvic pain unless noted otherwise   MMT Right eval Left eval  Hip flexion 4+ with pain right LB 5  Hip extension 5 5  Hip abduction 5* 4*r  Hip adduction 5 4+  Hip internal rotation      Hip external rotation      Knee flexion 5 4+  Knee extension 5 4+*  Ankle dorsiflexion 5 5   (Blank rows = not tested)   LUMBAR SPECIAL TESTS:  FABER test: Negative, negative SIJ sompression/distraction   FUNCTIONAL TESTS:  5 times sit to stand: 23.88 sec pelvic/low pain at 6/10   MODIFIED OSWESTRY: TBD     TODAY'S TREATMENT:    11/14/22  Manual: Addaday with pt in sidelying with pillows propped for pt comfort, at bil gluteals for tissue relaxation. Manual with PT hands on soft tissue mobility bil coccyx with pt having decreased mobility noted and pain reported in this region as her biggest pain area for pelvic pain currently. Pt reports this is where she feels pain, and replicated with pt gentle pressure, soft tissue mobility completed and tissue demonstrated good relief. Pt tolerated well and reported greatly decreased pain at end of session. 1/10.  Taping at abdomen - 5 pieces of k tape used. Pt continues to report improved tolerance to activity and decreased pain with use of tape. Pt understands how to remove/when to remove and  denies any skin irritation.    PATIENT EDUCATION:  Education details: PT eval findings, anticipated POC, HEP, postural awareness, education on log rolling for bed mobility, and discussed acquiring a belly band for support.   Person  educated: Patient Education method: Explanation, Demonstration, Verbal cues, and Handouts Education comprehension: verbalized understanding and returned demonstration   HOME EXERCISE PROGRAM:  Access Code: [email or web address] URL: [email or web address] Date: 09/27/2022 Prepared by: Tresa Endo Updated 10/17/22 by Sufyan Meidinger  ASSESSMENT:   CLINICAL IMPRESSION: pt presents for treatment today,session focused on external mobility and manual work at coccyx for improved mobility and decreased pain with good effect. Pt reported 4/10 at start of session and 1/10 at end of session. Pt tolerated well and continues to report improvement with pain and tolerance to activity outside of sessions. Pt now able to complete larger cleaning instances with decreased pain such as cleaning out closets.  and putting together nursery but does have pain with prolonged walking.  Ndidi continues to demonstrate potential for improvement and would benefit from continued skilled therapy to address impairments.    OBJECTIVE IMPAIRMENTS: decreased activity tolerance, decreased ROM, decreased strength, increased muscle spasms, impaired flexibility, and pain.    ACTIVITY LIMITATIONS: sitting, standing, transfers, and locomotion level   PARTICIPATION LIMITATIONS: occupation   PERSONAL FACTORS: Past/current experiences and 1-2 comorbidities: previous LB/pelvic pain and left knee pain  are also affecting patient's functional outcome.    REHAB POTENTIAL: Excellent   CLINICAL DECISION MAKING: Stable/uncomplicated   EVALUATION COMPLEXITY: Low     GOALS: Goals reviewed with patient? Yes   SHORT TERM GOALS: Target date: 09/14/2022   The patient will demonstrate knowledge of basic self care strategies and exercises to promote healing including log-rolling to get in/out of bed   Baseline: Goal status:MET   2.  The patient will report a 30% improvement in pain levels with functional activities which are currently difficult  including sit to stand, turning over in bed, getting in/out of car and walking. Baseline: 40% better (09/21/22) Goal status: MET    3.  Patient to complete Modified Oswestry by 2nd land visit for baseline functional assessment. Baseline:  Goal status: DEFERRED       LONG TERM GOALS: Target date: 10/26/2022   The patient will be independent in a safe self progression of a home exercise program to promote further recovery of function   Baseline:  Goal status: MET   2. The patient will report a 75% improvement in pain levels with functional activities which are currently difficult including bed mobility, sit to stand, getting in/out of car and walking Baseline:  Goal status: INITIAL   3.  The patient will have improved hip flexor and extensor muscle strength to 5/5 needed for lifting medium weight objects and carrying her infant  Baseline:  Goal status: INITIAL   4.  Improved modified Oswestry score by 12% indicating improved function with less pain Baseline: TBD Goal status: DEFERRED     PLAN:   PT FREQUENCY: 3x/week   PT DURATION: 8 weeks   PLANNED INTERVENTIONS: Therapeutic exercises, Therapeutic activity, Neuromuscular re-education, Patient/Family education, Self Care, Joint mobilization, Aquatic Therapy, Spinal mobilization, Cryotherapy, Moist heat, Taping, and Manual therapy.   PLAN FOR NEXT SESSION: core and hip strength and flexibility, manual therapy as needed, internal pelvic floor work as needed, stretching for birth prep Start working on pregnancy strengthening: thoracic openers, hip openers, Hip IR, gentle TA activation, glute med work, opp Tax inspector, posture strengthening   Otelia Sergeant, PT, DPT 11/13/2408:45  AM

## 2022-11-15 ENCOUNTER — Telehealth (INDEPENDENT_AMBULATORY_CARE_PROVIDER_SITE_OTHER): Payer: Commercial Managed Care - PPO | Admitting: Certified Nurse Midwife

## 2022-11-15 DIAGNOSIS — Z6791 Unspecified blood type, Rh negative: Secondary | ICD-10-CM

## 2022-11-15 DIAGNOSIS — Z8619 Personal history of other infectious and parasitic diseases: Secondary | ICD-10-CM

## 2022-11-15 DIAGNOSIS — Z3A35 35 weeks gestation of pregnancy: Secondary | ICD-10-CM

## 2022-11-15 DIAGNOSIS — O360130 Maternal care for anti-D [Rh] antibodies, third trimester, not applicable or unspecified: Secondary | ICD-10-CM

## 2022-11-15 DIAGNOSIS — Z3493 Encounter for supervision of normal pregnancy, unspecified, third trimester: Secondary | ICD-10-CM

## 2022-11-15 DIAGNOSIS — O26893 Other specified pregnancy related conditions, third trimester: Secondary | ICD-10-CM

## 2022-11-15 NOTE — Progress Notes (Unsigned)
I connected with  Martie Round on 11/15/22 at  1:55 PM EDT by telephone and verified that I am speaking with the correct person using two identifiers.   I discussed the limitations, risks, security and privacy concerns of performing an evaluation and management service by telephone and the availability of in person appointments. I also discussed with the patient that there may be a patient responsible charge related to this service. The patient expressed understanding and agreed to proceed.  BP cuff needs new batteries. Unable to check BP today. Denies any s/s of elevated BP.  Marjo Bicker, RN 11/15/2022  1:28 PM

## 2022-11-16 NOTE — Progress Notes (Signed)
OBSTETRICS PRENATAL VIRTUAL VISIT ENCOUNTER NOTE  Provider location: [facility] for [facility]   Patient location: Home  I connected with Martie Round on 11/16/22 at  1:55 PM EDT by MyChart Video Encounter and verified that I am speaking with the correct person using two identifiers. I discussed the limitations, risks, security and privacy concerns of performing an evaluation and management service virtually and the availability of in person appointments. I also discussed with the patient that there may be a patient responsible charge related to this service. The patient expressed understanding and agreed to proceed. Subjective:  Monique Pruitt is a 32 y.o. G1P0000 at [redacted]w[redacted]d being seen today for ongoing prenatal care.  She is currently monitored for the following issues for this low-risk pregnancy and has Encounter for supervision of normal pregnancy; History of HSV; Rh negative state in antepartum period; and Choroid plexus cyst on their problem list.  Patient reports no complaints.  Contractions: Not present. Vag. Bleeding: None.  Movement: Present. Denies any leaking of fluid.   The following portions of the patient's history were reviewed and updated as appropriate: allergies, current medications, past family history, past medical history, past social history, past surgical history and problem list.   Objective:  There were no vitals filed for this visit.  Fetal Status:     Movement: Present     General:  Alert, oriented and cooperative. Patient is in no acute distress.  Respiratory: Normal respiratory effort, no problems with respiration noted  Mental Status: Normal mood and affect. Normal behavior. Normal judgment and thought content.  Rest of physical exam deferred due to type of encounter  Imaging: No results found.  Assessment and Plan:  Pregnancy: G1P0000 at [redacted]w[redacted]d 1. Encounter for supervision of low-risk pregnancy in third  trimester - Doing well, feeling regular and vigorous fetal movement   2. [redacted] weeks gestation of pregnancy - Routine OB care - Pt is interested in waterbirth.  No contraindications at this time per chart review/patient assessment.   - Pt has taken the waterbirth class and several other classes including natural childbirth - Discussed waterbirth as option for low-risk pregnancy.  Reviewed conditions that may arise during pregnancy that will risk pt out of waterbirth including hypertension, diabetes, fetal growth restriction <10%ile, etc.  3. History of HSV - Will start Valtrex next week  4. Rh negative state in antepartum period - Received Rhogam 09/19/22  Preterm labor symptoms and general obstetric precautions including but not limited to vaginal bleeding, contractions, leaking of fluid and fetal movement were reviewed in detail with the patient. I discussed the assessment and treatment plan with the patient. The patient was provided an opportunity to ask questions and all were answered. The patient agreed with the plan and demonstrated an understanding of the instructions. The patient was advised to call back or seek an in-person office evaluation/go to [facility] at [facility] for any urgent or concerning symptoms. Please refer to After Visit Summary for other counseling recommendations.   I provided 10 minutes of face-to-face time during this encounter.  Return in about 1 week (around 11/22/2022) for IN-PERSON, LOB/GBS.  Future Appointments  Date Time Provider Department Center  11/20/2022  8:30 AM Lesly Dukes, MD [facility]  11/21/2022  8:45 AM Barbaraann Faster, PT [facility] None  11/22/2022  8:00 AM Manfred Shirts, PTA [facility] None  11/27/2022  8:30 AM Lesly Dukes, MD [facility]  11/28/2022  8:00 AM  Edrick Oh, PT [facility] None  11/29/2022  8:00 AM Manfred Shirts, PTA [facility] None  12/04/2022  8:30 AM Lesly Dukes, MD  [facility]  12/06/2022  8:00 AM Barbaraann Faster, PT [facility] None  12/08/2022  1:00 PM Manfred Shirts, PTA [facility] None    Bernerd Limbo, CNM [facility] for [employer], [facility]

## 2022-11-17 ENCOUNTER — Encounter: Payer: Self-pay | Admitting: Obstetrics & Gynecology

## 2022-11-20 ENCOUNTER — Ambulatory Visit (INDEPENDENT_AMBULATORY_CARE_PROVIDER_SITE_OTHER): Payer: Commercial Managed Care - PPO | Admitting: Obstetrics & Gynecology

## 2022-11-20 ENCOUNTER — Other Ambulatory Visit (HOSPITAL_COMMUNITY)
Admission: RE | Admit: 2022-11-20 | Discharge: 2022-11-20 | Disposition: A | Discharge status: Home / Self Care | Payer: Commercial Managed Care - PPO | Source: Ambulatory Visit | Attending: Obstetrics & Gynecology | Admitting: Obstetrics & Gynecology

## 2022-11-20 VITALS — BP 104/73 | HR 97 | Wt 168.0 lb

## 2022-11-20 DIAGNOSIS — Z8619 Personal history of other infectious and parasitic diseases: Secondary | ICD-10-CM

## 2022-11-20 DIAGNOSIS — O26899 Other specified pregnancy related conditions, unspecified trimester: Secondary | ICD-10-CM

## 2022-11-20 DIAGNOSIS — Z6791 Unspecified blood type, Rh negative: Secondary | ICD-10-CM

## 2022-11-20 DIAGNOSIS — Z3493 Encounter for supervision of normal pregnancy, unspecified, third trimester: Secondary | ICD-10-CM

## 2022-11-20 NOTE — Progress Notes (Addendum)
   PRENATAL VISIT NOTE  Subjective:  Monique Pruitt is a 32 y.o. G1P0000 at [redacted]w[redacted]d being seen today for ongoing prenatal care.  She is currently monitored for the following issues for this low-risk pregnancy and has Encounter for supervision of normal pregnancy; History of HSV; Rh negative state in antepartum period; and Choroid plexus cyst on their problem list.  Patient reports no complaints.  Contractions: Irritability. Vag. Bleeding: None.  Movement: Present. Denies leaking of fluid.   The following portions of the patient's history were reviewed and updated as appropriate: allergies, current medications, past family history, past medical history, past social history, past surgical history and problem list.   Objective:   Vitals:   11/20/22 0826  BP: 104/73  Pulse: 97  Weight: 168 lb (76.2 kg)    Fetal Status: Fetal Heart Rate (bpm): 140   Movement: Present     General:  Alert, oriented and cooperative. Patient is in no acute distress.  Skin: Skin is warm and dry. No rash noted.   Cardiovascular: Normal heart rate noted  Respiratory: Normal respiratory effort, no problems with respiration noted  Abdomen: Soft, gravid, appropriate for gestational age.  Pain/Pressure: Present     Pelvic: Cervical exam deferred        Extremities: Normal range of motion.     Mental Status: Normal mood and affect. Normal behavior. Normal judgment and thought content.   Assessment and Plan:  Pregnancy: G1P0000 at [redacted]w[redacted]d 1. Encounter for supervision of normal pregnancy in third trimester, unspecified gravidity - Culture, beta strep (group b only) - Cervicovaginal ancillary only( [facility])  2. History of HSV Valtrex suppression  3. Rh negative state in antepartum period Rho earlier and no bleeding.   4.  Breech Pt would like ECV; discussed risk, benefits and percent success. Will schedule at 37 weeks.   Preterm labor symptoms and general obstetric precautions including but not  limited to vaginal bleeding, contractions, leaking of fluid and fetal movement were reviewed in detail with the patient. Please refer to After Visit Summary for other counseling recommendations.   No follow-ups on file.  Future Appointments  Date Time Provider Department Center  11/21/2022  8:45 AM Barbaraann Faster, PT [facility] None  11/22/2022  8:00 AM Manfred Shirts, PTA [facility] None  11/27/2022  8:30 AM Lesly Dukes, MD [facility]  11/28/2022  8:00 AM Edrick Oh, PT [facility] None  11/29/2022  8:00 AM Manfred Shirts, PTA [facility] None  12/04/2022  8:30 AM Lesly Dukes, MD [facility]  12/06/2022  8:00 AM Barbaraann Faster, PT [facility] None  12/08/2022  1:00 PM Manfred Shirts, PTA [facility] None    Elsie Lincoln, MD

## 2022-11-21 ENCOUNTER — Ambulatory Visit: Payer: Commercial Managed Care - PPO | Admitting: Physical Therapy

## 2022-11-21 DIAGNOSIS — R293 Abnormal posture: Secondary | ICD-10-CM

## 2022-11-21 DIAGNOSIS — M6281 Muscle weakness (generalized): Secondary | ICD-10-CM | POA: Diagnosis not present

## 2022-11-21 DIAGNOSIS — M62838 Other muscle spasm: Secondary | ICD-10-CM

## 2022-11-21 LAB — CERVICOVAGINAL ANCILLARY ONLY
Chlamydia: NEGATIVE
Comment: NEGATIVE
Comment: NORMAL
Neisseria Gonorrhea: NEGATIVE

## 2022-11-21 NOTE — Therapy (Signed)
OUTPATIENT PHYSICAL THERAPY FEMALE PELVIC EVALUATION   Patient Name: Tonica Brasington MRN: [id number] DOB:1990-08-23, 32 y.o., female Today's Date: 11/21/2022  END OF SESSION:  PT End of Session - 11/21/22 0842     Visit Number 22    Date for PT Re-Evaluation 01/10/23    Authorization Type UHC    PT Start Time 0845    PT Stop Time 0929    PT Time Calculation (min) 44 min    Activity Tolerance Patient tolerated treatment well    Behavior During Therapy [facility] for tasks assessed/performed             Past Medical History:  Diagnosis Date   Depression    Genital HSV    Migraine without aura 03/15/2021   Past Surgical History:  Procedure Laterality Date   WISDOM TOOTH EXTRACTION     Patient Active Problem List   Diagnosis Date Noted   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   Rh negative state in antepartum period 05/31/2022   History of HSV 05/29/2022   Encounter for supervision of normal pregnancy 05/25/2022   PCP: None REFERRING PROVIDER: Lennart Pall, MD   REFERRING DIAG: O99.891,M54.9 (ICD-10-CM) - Back pain affecting pregnancy in second trimester   THERAPY DIAG:  Other muscle spasm  Abnormal posture  Muscle weakness (generalized)  Unspecified lack of coordination  Rationale for Evaluation and Treatment Rehabilitation  PERTINENT HISTORY: H/O LBP and pelvic pain, left knee pain, 24 weeks and 1 day pregnant EDD: 12/20/22, depression  PRECAUTIONS: other, pregnant  SUBJECTIVE:                                                                                                                                                                                      SUBJECTIVE STATEMENT:   Pt reports she is having pelvic girdle pain and unsure of any different things that would have made pain worse. Does report she had ultrasound yesterday and baby is now breech.     PAIN:  Are you having pain? NPRS scale:6/10 pelvis midline between legs Pain location:  Pain  description:  Aggravating factors:  Relieving factors:   PERTINENT HISTORY:  29wks  low-risk pregnancy and has Encounter for supervision of normal pregnancy; History of HSV; Rh negative state in antepartum period; and Choroid plexus cyst   Sexual abuse: No  BOWEL MOVEMENT: Pain with bowel movement: No Type of bowel movement:Type (Bristol Stool Scale) 4, Frequency slightly decreased but at least every 2 days, and Strain No Fully empty rectum: Yes:   Leakage: No Pads: No Fiber supplement: No  URINATION: Pain with urination: No Fully empty bladder: Yes:   Stream: Strong Urgency: Yes:  but with pregnancy Frequency: 2-4 hours Leakage:  none Pads: No  INTERCOURSE: Pain with intercourse:  slightly but not bad per pt, and not pain felt in hip/low back Ability to have vaginal penetration:  Yes:   Climax: not painful Marinoff Scale: 0/3  PREGNANCY: Vaginal deliveries 0 Tearing No C-section deliveries 0 Currently pregnant Yes:    PROLAPSE: None   OBJECTIVE:    PALPATION:   General TTP in bil gluteals, lumbar                 External Perineal Exam tension and TTP at Lt external pelvic floor                              Internal Pelvic Floor one small trigger point noted at Rt pubococcygeus however majority of pain and tension noted at lt side. Pt reports this is replicating her pain felt with hip/low back. Great tension noted at Lt pubococcygeus, iliococcygeus and bulbocavernosus.    Patient confirms identification and approves PT to assess internal pelvic floor and treatment Yes  PELVIC MMT:   MMT eval  Vaginal Strength Not assessed today due to pain  Internal Anal Sphincter   External Anal Sphincter   Puborectalis   Diastasis Recti   (Blank rows = not tested)        TONE: Increased   PROLAPSE: Not seen in hooklying     OBJECTIVE: (objective measures completed at initial evaluation unless otherwise dated)  DIAGNOSTIC FINDINGS:  N/A   SCREENING FOR RED  FLAGS: Neg   COGNITION: Overall cognitive status: Within functional limits for tasks assessed                          SENSATION: WFL   MUSCLE LENGTH: HS: mild tightness bil Piriformis:WNL   POSTURE: No Significant postural limitations   PALPATION: TTP in bil gluteals, lumbar   LUMBAR ROM: WNL, some tightness with left rotation 25% deficit   LOWER EXTREMITY ROM:   WNL   LOWER EXTREMITY MMT:  *pelvic pain unless noted otherwise   MMT Right eval Left eval  Hip flexion 4+ with pain right LB 5  Hip extension 5 5  Hip abduction 5* 4*r  Hip adduction 5 4+  Hip internal rotation      Hip external rotation      Knee flexion 5 4+  Knee extension 5 4+*  Ankle dorsiflexion 5 5   (Blank rows = not tested)   LUMBAR SPECIAL TESTS:  FABER test: Negative, negative SIJ sompression/distraction   FUNCTIONAL TESTS:  5 times sit to stand: 23.88 sec pelvic/low pain at 6/10   MODIFIED OSWESTRY: TBD     TODAY'S TREATMENT:     11/21/22: Therapeutic exercise:  On yoga ball: hip shift x10 each, circles x10 each Uneven hip shift in quad 2x30s each side Quad hip IR bil 2x30s Puppy pose 2x30s Half kneel green band resisted trunk rotation 2x10 each Cactus stretch x10 Trunk rotation in quad 2x30s    PATIENT EDUCATION:  Education details: PT eval findings, anticipated POC, HEP, postural awareness, education on log rolling for bed mobility, and discussed acquiring a belly band for support.   Person educated: Patient Education method: Explanation, Demonstration, Verbal cues, and Handouts Education comprehension: verbalized understanding and returned demonstration   HOME EXERCISE PROGRAM:  Access Code: [email or web address] URL: [email or web address] Date: 09/27/2022 Prepared by: Tresa Endo Updated 10/17/22 by Taniela Feltus  ASSESSMENT:  CLINICAL IMPRESSION: pt presents for treatment today,session focused on spina mobility and pelvic stability in various positions for improved pain  levels. Pt tolerated well. Denied any adverse symptoms and overall pleased with progress. In general reports her pain is much better but thinks baby's position has made her more uncomfortable. Has appointment with obgyn next week to recheck her positioning. Lindley continues to demonstrate potential for improvement and would benefit from continued skilled therapy to address impairments.    OBJECTIVE IMPAIRMENTS: decreased activity tolerance, decreased ROM, decreased strength, increased muscle spasms, impaired flexibility, and pain.    ACTIVITY LIMITATIONS: sitting, standing, transfers, and locomotion level   PARTICIPATION LIMITATIONS: occupation   PERSONAL FACTORS: Past/current experiences and 1-2 comorbidities: previous LB/pelvic pain and left knee pain  are also affecting patient's functional outcome.    REHAB POTENTIAL: Excellent   CLINICAL DECISION MAKING: Stable/uncomplicated   EVALUATION COMPLEXITY: Low     GOALS: Goals reviewed with patient? Yes   SHORT TERM GOALS: Target date: 09/14/2022   The patient will demonstrate knowledge of basic self care strategies and exercises to promote healing including log-rolling to get in/out of bed   Baseline: Goal status:MET   2.  The patient will report a 30% improvement in pain levels with functional activities which are currently difficult including sit to stand, turning over in bed, getting in/out of car and walking. Baseline: 40% better (09/21/22) Goal status: MET    3.  Patient to complete Modified Oswestry by 2nd land visit for baseline functional assessment. Baseline:  Goal status: DEFERRED       LONG TERM GOALS: Target date: 10/26/2022   The patient will be independent in a safe self progression of a home exercise program to promote further recovery of function   Baseline:  Goal status: MET   2. The patient will report a 75% improvement in pain levels with functional activities which are currently difficult including bed  mobility, sit to stand, getting in/out of car and walking Baseline:  Goal status: INITIAL   3.  The patient will have improved hip flexor and extensor muscle strength to 5/5 needed for lifting medium weight objects and carrying her infant  Baseline:  Goal status: INITIAL   4.  Improved modified Oswestry score by 12% indicating improved function with less pain Baseline: TBD Goal status: DEFERRED     PLAN:   PT FREQUENCY: 3x/week   PT DURATION: 8 weeks   PLANNED INTERVENTIONS: Therapeutic exercises, Therapeutic activity, Neuromuscular re-education, Patient/Family education, Self Care, Joint mobilization, Aquatic Therapy, Spinal mobilization, Cryotherapy, Moist heat, Taping, and Manual therapy.   PLAN FOR NEXT SESSION: core and hip strength and flexibility, manual therapy as needed, internal pelvic floor work as needed, stretching for birth prep Start working on pregnancy strengthening: thoracic openers, hip openers, Hip IR, gentle TA activation, glute med work, opp Tax inspector, posture strengthening   Otelia Sergeant, PT, DPT 11/20/2408:19 AM

## 2022-11-21 NOTE — Therapy (Addendum)
OUTPATIENT PHYSICAL THERAPY FEMALE PELVIC   Patient Name: Monique Pruitt MRN: [id number] DOB:Aug 19, 1990, 32 y.o., female Today's Date: 11/22/2022  END OF SESSION:  PT End of Session - 11/22/22 0800     Visit Number 23    Date for PT Re-Evaluation 01/10/23    Authorization Type UHC    PT Start Time 0800    PT Stop Time 0845    PT Time Calculation (min) 45 min    Activity Tolerance Patient tolerated treatment well    Behavior During Therapy [facility] for tasks assessed/performed              Past Medical History:  Diagnosis Date   Depression    Genital HSV    Migraine without aura 03/15/2021   Past Surgical History:  Procedure Laterality Date   WISDOM TOOTH EXTRACTION     Patient Active Problem List   Diagnosis Date Noted   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   Rh negative state in antepartum period 05/31/2022   History of HSV 05/29/2022   Encounter for supervision of normal pregnancy 05/25/2022   PCP: None REFERRING PROVIDER: Lennart Pall, MD   REFERRING DIAG: O99.891,M54.9 (ICD-10-CM) - Back pain affecting pregnancy in second trimester   THERAPY DIAG:  Other muscle spasm  Abnormal posture  Muscle weakness (generalized)  Unspecified lack of coordination  Rationale for Evaluation and Treatment Rehabilitation  PERTINENT HISTORY: H/O LBP and pelvic pain, left knee pain, 24 weeks and 1 day pregnant EDD: 12/20/22, depression  PRECAUTIONS: other, pregnant  SUBJECTIVE:                                                                                                                                                                                      SUBJECTIVE STATEMENT:  Had some pubic symphysis pain and grinding this AM which was horrible, but right now I am doing ok.    PAIN:  Are you having pain? NPRS scale:4/10 pelvis  Pain location:  Pain description:  Aggravating factors:  Relieving factors:   PERTINENT HISTORY:  29wks  low-risk pregnancy  and has Encounter for supervision of normal pregnancy; History of HSV; Rh negative state in antepartum period; and Choroid plexus cyst   Sexual abuse: No  BOWEL MOVEMENT: Pain with bowel movement: No Type of bowel movement:Type (Bristol Stool Scale) 4, Frequency slightly decreased but at least every 2 days, and Strain No Fully empty rectum: Yes:   Leakage: No Pads: No Fiber supplement: No  URINATION: Pain with urination: No Fully empty bladder: Yes:   Stream: Strong Urgency: Yes: but with pregnancy Frequency: 2-4 hours Leakage:  none Pads: No  INTERCOURSE: Pain with intercourse:  slightly but not bad per pt, and not pain felt in hip/low back Ability to have vaginal penetration:  Yes:   Climax: not painful Marinoff Scale: 0/3  PREGNANCY: Vaginal deliveries 0 Tearing No C-section deliveries 0 Currently pregnant Yes:    PROLAPSE: None   OBJECTIVE:    PALPATION:   General TTP in bil gluteals, lumbar                 External Perineal Exam tension and TTP at Lt external pelvic floor                              Internal Pelvic Floor one small trigger point noted at Rt pubococcygeus however majority of pain and tension noted at lt side. Pt reports this is replicating her pain felt with hip/low back. Great tension noted at Lt pubococcygeus, iliococcygeus and bulbocavernosus.    Patient confirms identification and approves PT to assess internal pelvic floor and treatment Yes  PELVIC MMT:   MMT eval  Vaginal Strength Not assessed today due to pain  Internal Anal Sphincter   External Anal Sphincter   Puborectalis   Diastasis Recti   (Blank rows = not tested)        TONE: Increased   PROLAPSE: Not seen in hooklying     OBJECTIVE: (objective measures completed at initial evaluation unless otherwise dated)  DIAGNOSTIC FINDINGS:  N/A   SCREENING FOR RED FLAGS: Neg   COGNITION: Overall cognitive status: Within functional limits for tasks assessed                           SENSATION: WFL   MUSCLE LENGTH: HS: mild tightness bil Piriformis:WNL   POSTURE: No Significant postural limitations   PALPATION: TTP in bil gluteals, lumbar   LUMBAR ROM: WNL, some tightness with left rotation 25% deficit   LOWER EXTREMITY ROM:   WNL   LOWER EXTREMITY MMT:  *pelvic pain unless noted otherwise   MMT Right eval Left eval  Hip flexion 4+ with pain right LB 5  Hip extension 5 5  Hip abduction 5* 4*r  Hip adduction 5 4+  Hip internal rotation      Hip external rotation      Knee flexion 5 4+  Knee extension 5 4+*  Ankle dorsiflexion 5 5   (Blank rows = not tested)   LUMBAR SPECIAL TESTS:  FABER test: Negative, negative SIJ sompression/distraction   FUNCTIONAL TESTS:  5 times sit to stand: 23.88 sec pelvic/low pain at 6/10   MODIFIED OSWESTRY: TBD     TODAY'S TREATMENT:     11/22/22:Pt arrives for aquatic physical therapy. Treatment took place in 3.5-5.5 feet of water. Water temperature was. Pt entered the pool via stairs with mod use of rails and extra caution. Pt requires buoyancy of water for support and to offload joints with strengthening exercises.  Pt utilizes viscosity of the water required for strengthening. Seated water bench with 75% submersion Pt performed seated LE AROM exercises 20x in all planes, with concurrent discussion of pain and current status.  75% water depth water walking in all directions 10 lengths with vigorous UE movements using single buoy UE weights. Hip 3 way kicks Bil 15x with extra focus on standing leg hip stabilization, no UE needed for balance. Press and hold single buoy wts under water with slow high knee march across pool 4 lengths. Mini squat  against the wall with double buoy lat press/core press with first and second stage of labor breathing 2x10.   11/21/22: Therapeutic exercise:  On yoga ball: hip shift x10 each, circles x10 each Uneven hip shift in quad 2x30s each side Quad hip IR bil 2x30s Puppy  pose 2x30s Half kneel green band resisted trunk rotation 2x10 each Cactus stretch x10 Trunk rotation in quad 2x30s    PATIENT EDUCATION:  Education details: PT eval findings, anticipated POC, HEP, postural awareness, education on log rolling for bed mobility, and discussed acquiring a belly band for support.   Person educated: Patient Education method: Explanation, Demonstration, Verbal cues, and Handouts Education comprehension: verbalized understanding and returned demonstration   HOME EXERCISE PROGRAM:  Access Code: [email or web address] URL: [email or web address] Date: 09/27/2022 Prepared by: Tresa Endo Updated 10/17/22 by HALEY  ASSESSMENT:   CLINICAL IMPRESSION: Pt had AM pubic symphysis pain but arrived for aquatic PT feeling pretty good. No pain with aquatic exercises continues. Using first and second stage labor breathing during core exercises.   OBJECTIVE IMPAIRMENTS: decreased activity tolerance, decreased ROM, decreased strength, increased muscle spasms, impaired flexibility, and pain.    ACTIVITY LIMITATIONS: sitting, standing, transfers, and locomotion level   PARTICIPATION LIMITATIONS: occupation   PERSONAL FACTORS: Past/current experiences and 1-2 comorbidities: previous LB/pelvic pain and left knee pain  are also affecting patient's functional outcome.    REHAB POTENTIAL: Excellent   CLINICAL DECISION MAKING: Stable/uncomplicated   EVALUATION COMPLEXITY: Low     GOALS: Goals reviewed with patient? Yes   SHORT TERM GOALS: Target date: 09/14/2022   The patient will demonstrate knowledge of basic self care strategies and exercises to promote healing including log-rolling to get in/out of bed   Baseline: Goal status:MET   2.  The patient will report a 30% improvement in pain levels with functional activities which are currently difficult including sit to stand, turning over in bed, getting in/out of car and walking. Baseline: 40% better (09/21/22) Goal status:  MET    3.  Patient to complete Modified Oswestry by 2nd land visit for baseline functional assessment. Baseline:  Goal status: DEFERRED       LONG TERM GOALS: Target date: 10/26/2022   The patient will be independent in a safe self progression of a home exercise program to promote further recovery of function   Baseline:  Goal status: MET   2. The patient will report a 75% improvement in pain levels with functional activities which are currently difficult including bed mobility, sit to stand, getting in/out of car and walking Baseline:  Goal status: INITIAL   3.  The patient will have improved hip flexor and extensor muscle strength to 5/5 needed for lifting medium weight objects and carrying her infant  Baseline:  Goal status: INITIAL   4.  Improved modified Oswestry score by 12% indicating improved function with less pain Baseline: TBD Goal status: DEFERRED     PLAN:   PT FREQUENCY: 3x/week   PT DURATION: 8 weeks   PLANNED INTERVENTIONS: Therapeutic exercises, Therapeutic activity, Neuromuscular re-education, Patient/Family education, Self Care, Joint mobilization, Aquatic Therapy, Spinal mobilization, Cryotherapy, Moist heat, Taping, and Manual therapy.   PLAN FOR NEXT SESSION: core and hip strength and flexibility, manual therapy as needed, internal pelvic floor work as needed, stretching for birth prep Start working on pregnancy strengthening: thoracic openers, hip openers, Hip IR, gentle TA activation, glute med work, opp Tax inspector, posture strengthening  Ane Payment, PTA 11/22/22 12:11 PM

## 2022-11-22 ENCOUNTER — Encounter: Payer: Self-pay | Admitting: Physical Therapy

## 2022-11-22 ENCOUNTER — Encounter: Payer: Self-pay | Admitting: Obstetrics & Gynecology

## 2022-11-22 ENCOUNTER — Ambulatory Visit: Payer: Commercial Managed Care - PPO | Admitting: Physical Therapy

## 2022-11-22 DIAGNOSIS — M62838 Other muscle spasm: Secondary | ICD-10-CM

## 2022-11-22 DIAGNOSIS — R293 Abnormal posture: Secondary | ICD-10-CM

## 2022-11-22 DIAGNOSIS — M549 Dorsalgia, unspecified: Secondary | ICD-10-CM

## 2022-11-22 DIAGNOSIS — M6281 Muscle weakness (generalized): Secondary | ICD-10-CM

## 2022-11-22 DIAGNOSIS — R262 Difficulty in walking, not elsewhere classified: Secondary | ICD-10-CM

## 2022-11-22 DIAGNOSIS — R279 Unspecified lack of coordination: Secondary | ICD-10-CM

## 2022-11-22 DIAGNOSIS — R102 Pelvic and perineal pain: Secondary | ICD-10-CM

## 2022-11-23 ENCOUNTER — Encounter (HOSPITAL_COMMUNITY): Payer: Self-pay | Admitting: *Deleted

## 2022-11-23 ENCOUNTER — Telehealth (HOSPITAL_COMMUNITY): Payer: Self-pay | Admitting: *Deleted

## 2022-11-23 NOTE — Telephone Encounter (Signed)
Preadmission screen  

## 2022-11-24 LAB — CULTURE, BETA STREP (GROUP B ONLY): Strep Gp B Culture: NEGATIVE

## 2022-11-27 ENCOUNTER — Encounter: Payer: Commercial Managed Care - PPO | Admitting: Obstetrics & Gynecology

## 2022-11-28 ENCOUNTER — Ambulatory Visit: Payer: Commercial Managed Care - PPO

## 2022-11-29 ENCOUNTER — Ambulatory Visit: Payer: Commercial Managed Care - PPO | Admitting: Physical Therapy

## 2022-11-29 ENCOUNTER — Other Ambulatory Visit: Payer: Self-pay

## 2022-11-29 ENCOUNTER — Observation Stay (HOSPITAL_COMMUNITY)
Admission: RE | Admit: 2022-11-29 | Payer: Commercial Managed Care - PPO | Source: Ambulatory Visit | Admitting: Obstetrics and Gynecology

## 2022-11-29 ENCOUNTER — Encounter: Payer: Self-pay | Admitting: *Deleted

## 2022-11-29 ENCOUNTER — Encounter (HOSPITAL_COMMUNITY): Payer: Self-pay | Admitting: Anesthesiology

## 2022-11-29 ENCOUNTER — Encounter: Payer: Self-pay | Admitting: Obstetrics & Gynecology

## 2022-11-29 ENCOUNTER — Encounter (HOSPITAL_COMMUNITY): Payer: Self-pay | Admitting: Obstetrics and Gynecology

## 2022-11-29 ENCOUNTER — Ambulatory Visit (HOSPITAL_COMMUNITY)
Admission: AD | Admit: 2022-11-29 | Discharge: 2022-11-29 | Disposition: A | Discharge status: Home / Self Care | Payer: Commercial Managed Care - PPO | Attending: Obstetrics and Gynecology | Admitting: Obstetrics and Gynecology

## 2022-11-29 DIAGNOSIS — Z3A37 37 weeks gestation of pregnancy: Secondary | ICD-10-CM | POA: Diagnosis not present

## 2022-11-29 DIAGNOSIS — O98513 Other viral diseases complicating pregnancy, third trimester: Secondary | ICD-10-CM | POA: Insufficient documentation

## 2022-11-29 DIAGNOSIS — Z6791 Unspecified blood type, Rh negative: Secondary | ICD-10-CM | POA: Insufficient documentation

## 2022-11-29 DIAGNOSIS — O321XX0 Maternal care for breech presentation, not applicable or unspecified: Secondary | ICD-10-CM | POA: Insufficient documentation

## 2022-11-29 DIAGNOSIS — B009 Herpesviral infection, unspecified: Secondary | ICD-10-CM | POA: Diagnosis not present

## 2022-11-29 DIAGNOSIS — Z01818 Encounter for other preprocedural examination: Secondary | ICD-10-CM | POA: Diagnosis not present

## 2022-11-29 DIAGNOSIS — O26893 Other specified pregnancy related conditions, third trimester: Secondary | ICD-10-CM | POA: Diagnosis not present

## 2022-11-29 DIAGNOSIS — Z23 Encounter for immunization: Secondary | ICD-10-CM | POA: Insufficient documentation

## 2022-11-29 DIAGNOSIS — O26899 Other specified pregnancy related conditions, unspecified trimester: Secondary | ICD-10-CM

## 2022-11-29 LAB — CBC
HCT: 35.7 % — ABNORMAL LOW (ref 36.0–46.0)
Hemoglobin: 12 g/dL (ref 12.0–15.0)
MCH: 30.6 pg (ref 26.0–34.0)
MCHC: 33.6 g/dL (ref 30.0–36.0)
MCV: 91.1 fL (ref 80.0–100.0)
Platelets: 134 10*3/uL — ABNORMAL LOW (ref 150–400)
RBC: 3.92 MIL/uL (ref 3.87–5.11)
RDW: 13.5 % (ref 11.5–15.5)
WBC: 7.6 10*3/uL (ref 4.0–10.5)
nRBC: 0 % (ref 0.0–0.2)

## 2022-11-29 LAB — TYPE AND SCREEN
ABO/RH(D): O NEG
Antibody Screen: POSITIVE

## 2022-11-29 LAB — ABO/RH: ABO/RH(D): O NEG

## 2022-11-29 LAB — RH IG WORKUP (INCLUDES ABO/RH)
Gestational Age(Wks): 37
Unit division: 0

## 2022-11-29 MED ORDER — LACTATED RINGERS IV SOLN
INTRAVENOUS | Status: DC
  Administered 2022-11-29: 125 mL/h via INTRAVENOUS

## 2022-11-29 MED ORDER — TERBUTALINE SULFATE 1 MG/ML IJ SOLN
0.2500 mg | Freq: Once | INTRAMUSCULAR | Status: AC
  Administered 2022-11-29: 0.25 mg via SUBCUTANEOUS

## 2022-11-29 MED ORDER — RHO D IMMUNE GLOBULIN 1500 UNIT/2ML IJ SOSY
300.0000 ug | PREFILLED_SYRINGE | Freq: Once | INTRAMUSCULAR | Status: AC
  Administered 2022-11-29: 300 ug via INTRAMUSCULAR
  Filled 2022-11-29: qty 2

## 2022-11-29 MED ORDER — TERBUTALINE SULFATE 1 MG/ML IJ SOLN
INTRAMUSCULAR | Status: AC
  Filled 2022-11-29: qty 1

## 2022-11-29 NOTE — H&P (Addendum)
LABOR AND DELIVERY ADMISSION HISTORY AND PHYSICAL NOTE  Monique Pruitt is a 32 y.o. female G1P0000 with IUP at [redacted]w[redacted]d by lmp presenting for ecv.   She reports positive fetal movement. She denies leakage of fluid or vaginal bleeding.  Prenatal History/Complications:  Past Medical History: Past Medical History:  Diagnosis Date   Depression    Genital HSV    Migraine without aura 03/15/2021    Past Surgical History: Past Surgical History:  Procedure Laterality Date   WISDOM TOOTH EXTRACTION      Obstetrical History: OB History     Gravida  1   Para  0   Term  0   Preterm  0   AB  0   Living  0      SAB  0   IAB  0   Ectopic  0   Multiple  0   Live Births  0           Social History: Social History   Socioeconomic History   Marital status: Married    Spouse name: Not on file   Number of children: Not on file   Years of education: Not on file   Highest education level: Not on file  Occupational History   Not on file  Tobacco Use   Smoking status: Never   Smokeless tobacco: Never  Vaping Use   Vaping status: Never Used  Substance and Sexual Activity   Alcohol use: Not Currently    Comment: occ   Drug use: Never   Sexual activity: Yes    Birth control/protection: None  Other Topics Concern   Not on file  Social History Narrative   Not on file   Social Determinants of Health   Financial Resource Strain: Not on file  Food Insecurity: No Food Insecurity (11/29/2022)   Hunger Vital Sign    Worried About Running Out of Food in the Last Year: Never true    Ran Out of Food in the Last Year: Never true  Transportation Needs: No Transportation Needs (11/29/2022)   PRAPARE - Administrator, Civil Service (Medical): No    Lack of Transportation (Non-Medical): No  Physical Activity: Not on file  Stress: Not on file  Social Connections: Unknown (09/13/2021)   Received from [facility]   Social Network    Social Network: Not  on file    Family History: Family History  Problem Relation Age of Onset   Endometriosis Mother    Ovarian cancer Maternal Grandmother     Allergies: No Known Allergies  Medications Prior to Admission  Medication Sig Dispense Refill Last Dose   cyclobenzaprine (FLEXERIL) 10 MG tablet Take 1 tablet (10 mg total) by mouth 3 (three) times daily as needed for muscle spasms. (Patient not taking: Reported on 10/30/2022) 30 tablet 2    lidocaine-prilocaine (EMLA) cream Apply 1 Application topically as needed. (Patient not taking: Reported on 08/30/2022) 30 g 0    Prenatal Vit-Fe Fumarate-FA (PRENATAL VITAMIN PO) Take by mouth.      valACYclovir (VALTREX) 1000 MG tablet Take 1 Gm BID for 7 days then 1 Gm daily for remainder of pregnancy. 90 tablet 1      Review of Systems   All systems reviewed and negative except as stated in HPI  Blood pressure 115/88, pulse 95, temperature 98.5 F (36.9 C), temperature source Oral, resp. rate 16, height 5\' 4"  (1.626 m), weight 76.6 kg, last menstrual period 03/15/2022. General appearance: alert,  cooperative, and appears stated age Lungs: clear to auscultation bilaterally Heart: regular rate and rhythm Abdomen: soft, non-tender; bowel sounds normal Extremities: No calf swelling or tenderness Presentation: breech Fetal monitoring: 140/mod/+a/-d Uterine activity: quiet     Prenatal labs: ABO, Rh: --/--/PENDING (07/24 0910) Antibody: POS (07/24 0820) Rubella: 4.34 (01/22 1049) RPR: Non Reactive (05/14 0853)  HBsAg: Negative (01/22 1049)  HIV: Non Reactive (05/14 0853)  Results for orders placed or performed during the hospital encounter of 11/29/22 (from the past 24 hour(s))  CBC   Collection Time: 11/29/22  8:19 AM  Result Value Ref Range   WBC 7.6 4.0 - 10.5 K/uL   RBC 3.92 3.87 - 5.11 MIL/uL   Hemoglobin 12.0 12.0 - 15.0 g/dL   HCT 29.5 (L) 62.1 - 30.8 %   MCV 91.1 80.0 - 100.0 fL   MCH 30.6 26.0 - 34.0 pg   MCHC 33.6 30.0 - 36.0 g/dL    RDW 65.7 84.6 - 96.2 %   Platelets 134 (L) 150 - 400 K/uL   nRBC 0.0 0.0 - 0.2 %  Type and screen [facility]   Collection Time: 11/29/22  8:20 AM  Result Value Ref Range   ABO/RH(D) O NEG    Antibody Screen POS    Sample Expiration      12/02/2022,2359 Performed at [facility], [address], [place], [place] [address]   ABO/Rh   Collection Time: 11/29/22  9:10 AM  Result Value Ref Range   ABO/RH(D) PENDING   Rh IG workup (includes ABO/Rh)   Collection Time: 11/29/22  9:16 AM  Result Value Ref Range   Gestational Age(Wks)      39 Performed at [facility], [address], [place], [place] [address]     Patient Active Problem List   Diagnosis Date Noted   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   Rh negative state in antepartum period 05/31/2022   History of HSV 05/29/2022   Encounter for supervision of normal pregnancy 05/25/2022    Assessment: Monique Pruitt is a 32 y.o. G1P0000 at [redacted]w[redacted]d here for for ECV. On valtrex ppx, denies active lesions or prodrome or recent outbreak.  Written informed consent obtained. Risks of procedure including nonreassuring fetal heart tracing, fetal injury, abruption, arom, cord prolapse, need for emergency cesarean section, discussed with patient. See procedure note for details of procedure.   Cherrie Gauze Kaari Zeigler 11/29/2022, 10:20 AM

## 2022-11-29 NOTE — Discharge Summary (Signed)
See h and p and procedure note for details. 32 yo g1 @ 37+0, rh negative, hx hsv on suppressive valtrex with no s/s of active infection, here for ECV today, not successful. Rhogam given. Post-procedure reactive NST. Risks of breech vaginal delivery discussed with patient, she does not elect a trial of breech delivery. Primary cesarean scheduled for 8/8 when patient will be 39+1 (schedule full the day prior).

## 2022-11-29 NOTE — Procedures (Signed)
External Cephalic Version   After informed verbal consent, Terbutaline 0.25 mg SQ given, ECV was attempted under Ultrasound guidance.  Initially attempted forward summersault to mother's right, two attempts, unsuccessful. Then attempted backward summersault to mother's left, two attempts, unsuccessful. Patient tolerated the procedure well. Fetal heart rate monitored throughout. HR decreased from baseline 140s to 100s after 4th attempt, this spontaneously resolved.   Patient will be monitored for 1 hour and discharged home assuming reactive tracing.

## 2022-11-30 ENCOUNTER — Telehealth (HOSPITAL_COMMUNITY): Payer: Self-pay | Admitting: *Deleted

## 2022-11-30 ENCOUNTER — Ambulatory Visit: Payer: Commercial Managed Care - PPO | Admitting: Physical Therapy

## 2022-11-30 DIAGNOSIS — M6281 Muscle weakness (generalized): Secondary | ICD-10-CM | POA: Diagnosis not present

## 2022-11-30 DIAGNOSIS — R293 Abnormal posture: Secondary | ICD-10-CM

## 2022-11-30 LAB — RH IG WORKUP (INCLUDES ABO/RH)

## 2022-11-30 NOTE — Therapy (Signed)
OUTPATIENT PHYSICAL THERAPY FEMALE PELVIC EVALUATION   Patient Name: Monique Pruitt MRN: [id number] DOB:[birth year], 32 y.o., female Today's Date: 11/30/2022  END OF SESSION:  PT End of Session - 11/30/22 0844     Visit Number 24    Date for PT Re-Evaluation 01/10/23    Authorization Type UHC    PT Start Time 0845    PT Stop Time 0928    PT Time Calculation (min) 43 min    Activity Tolerance Patient tolerated treatment well    Behavior During Therapy [facility] for tasks assessed/performed             Past Medical History:  Diagnosis Date   Depression    Genital HSV    Migraine without aura 03/15/2021   Past Surgical History:  Procedure Laterality Date   WISDOM TOOTH EXTRACTION     Patient Active Problem List   Diagnosis Date Noted   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   Rh negative state in antepartum period 05/31/2022   History of HSV 05/29/2022   Encounter for supervision of normal pregnancy 05/25/2022   PCP: None REFERRING PROVIDER: Lennart Pall, MD   REFERRING DIAG: O99.891,M54.9 (ICD-10-CM) - Back pain affecting pregnancy in second trimester   THERAPY DIAG:  Other muscle spasm  Abnormal posture  Muscle weakness (generalized)  Unspecified lack of coordination  Rationale for Evaluation and Treatment Rehabilitation  PERTINENT HISTORY: H/O LBP and pelvic pain, left knee pain, 24 weeks and 1 day pregnant EDD: 12/20/22, depression  PRECAUTIONS: other, pregnant  SUBJECTIVE:                                                                                                                                                                                      SUBJECTIVE STATEMENT:   Pt reports pelvic pain is "about a twinge" however did have aversion yesterday and sore from this. Baby did not turn and c-section planned for 8/8.  PAIN:  Are you having pain? NPRS scale:3/10 soreness at back and abdomen after appointment at OB's office yesterday.  Pain  location:  Pain description:  Aggravating factors:  Relieving factors:   PERTINENT HISTORY:  29wks  low-risk pregnancy and has Encounter for supervision of normal pregnancy; History of HSV; Rh negative state in antepartum period; and Choroid plexus cyst   Sexual abuse: No  BOWEL MOVEMENT: Pain with bowel movement: No Type of bowel movement:Type (Bristol Stool Scale) 4, Frequency slightly decreased but at least every 2 days, and Strain No Fully empty rectum: Yes:   Leakage: No Pads: No Fiber supplement: No  URINATION: Pain with urination: No Fully empty bladder: Yes:   Stream: Strong Urgency: Yes:  but with pregnancy Frequency: 2-4 hours Leakage:  none Pads: No  INTERCOURSE: Pain with intercourse:  slightly but not bad per pt, and not pain felt in hip/low back Ability to have vaginal penetration:  Yes:   Climax: not painful Marinoff Scale: 0/3  PREGNANCY: Vaginal deliveries 0 Tearing No C-section deliveries 0 Currently pregnant Yes:    PROLAPSE: None   OBJECTIVE:    PALPATION:   General TTP in bil gluteals, lumbar                 External Perineal Exam tension and TTP at Lt external pelvic floor                              Internal Pelvic Floor one small trigger point noted at Rt pubococcygeus however majority of pain and tension noted at lt side. Pt reports this is replicating her pain felt with hip/low back. Great tension noted at Lt pubococcygeus, iliococcygeus and bulbocavernosus.    Patient confirms identification and approves PT to assess internal pelvic floor and treatment Yes  PELVIC MMT:   MMT eval  Vaginal Strength Not assessed today due to pain  Internal Anal Sphincter   External Anal Sphincter   Puborectalis   Diastasis Recti   (Blank rows = not tested)        TONE: Increased   PROLAPSE: Not seen in hooklying     OBJECTIVE: (objective measures completed at initial evaluation unless otherwise dated)  DIAGNOSTIC FINDINGS:  N/A    SCREENING FOR RED FLAGS: Neg   COGNITION: Overall cognitive status: Within functional limits for tasks assessed                          SENSATION: WFL   MUSCLE LENGTH: HS: mild tightness bil Piriformis:WNL   POSTURE: No Significant postural limitations   PALPATION: TTP in bil gluteals, lumbar   LUMBAR ROM: WNL, some tightness with left rotation 25% deficit   LOWER EXTREMITY ROM:   WNL   LOWER EXTREMITY MMT:  *pelvic pain unless noted otherwise   MMT Right eval Left eval  Hip flexion 4+ with pain right LB 5  Hip extension 5 5  Hip abduction 5* 4*r  Hip adduction 5 4+  Hip internal rotation      Hip external rotation      Knee flexion 5 4+  Knee extension 5 4+*  Ankle dorsiflexion 5 5   (Blank rows = not tested)   LUMBAR SPECIAL TESTS:  FABER test: Negative, negative SIJ sompression/distraction   FUNCTIONAL TESTS:  5 times sit to stand: 23.88 sec pelvic/low pain at 6/10   MODIFIED OSWESTRY: TBD     TODAY'S TREATMENT:     11/21/22: Therapeutic exercise:  On yoga ball: hip shift x10 each, circles x10 each Uneven hip shift in quad 2x30s each side Quad hip IR bil 2x30s Puppy pose 2x30s Half kneel green band resisted trunk rotation 2x10 each Cactus stretch x10 Trunk rotation in quad 2x30s  11/30/22: Manual: in sidelying with pillows for comfort. Addaday in L1 at glute, thoracic and lumbar paraspinals with light pressure for improved tissue relaxation and improved mobility. Pt reports she was sore after appointment yesterday and gentle mobility and manual completed today for pt comfort. Manual hands on gentle soft tissue completed as well for further tissue relaxation, this was all completed bil. pt reporting improvement in soreness at back at end  of session.    PATIENT EDUCATION:  Education details: PT eval findings, anticipated POC, HEP, postural awareness, education on log rolling for bed mobility, and discussed acquiring a belly band for support.    Person educated: Patient Education method: Explanation, Demonstration, Verbal cues, and Handouts Education comprehension: verbalized understanding and returned demonstration   HOME EXERCISE PROGRAM:  Access Code: [email or web address] URL: [email or web address] Date: 09/27/2022 Prepared by: Tresa Endo Updated 10/17/22 by Adalaya Irion  ASSESSMENT:   CLINICAL IMPRESSION: pt presents for treatment today,session focused on gentle manual work at thoracic and lumbar paraspinals, QL, and bil gluteals.  Pt tolerated well and reported feeling better at end of session as she was having soreness after aversion appointment yesterday. Denied any adverse symptoms and overall pleased with progress. Pt now has plans for c-section due to baby being breech at 12/14/22 and pt reports feeling initially stressed about this but now more "at peace with it". Overall pelvic pain is improving but does benefit from additional sessions for spinal mobility and relaxation techniques. Kima continues to demonstrate potential for improvement and would benefit from continued skilled therapy to address impairments.    OBJECTIVE IMPAIRMENTS: decreased activity tolerance, decreased ROM, decreased strength, increased muscle spasms, impaired flexibility, and pain.    ACTIVITY LIMITATIONS: sitting, standing, transfers, and locomotion level   PARTICIPATION LIMITATIONS: occupation   PERSONAL FACTORS: Past/current experiences and 1-2 comorbidities: previous LB/pelvic pain and left knee pain  are also affecting patient's functional outcome.    REHAB POTENTIAL: Excellent   CLINICAL DECISION MAKING: Stable/uncomplicated   EVALUATION COMPLEXITY: Low     GOALS: Goals reviewed with patient? Yes   SHORT TERM GOALS: Target date: 09/14/2022   The patient will demonstrate knowledge of basic self care strategies and exercises to promote healing including log-rolling to get in/out of bed   Baseline: Goal status:MET   2.  The patient will  report a 30% improvement in pain levels with functional activities which are currently difficult including sit to stand, turning over in bed, getting in/out of car and walking. Baseline: 40% better (09/21/22) Goal status: MET    3.  Patient to complete Modified Oswestry by 2nd land visit for baseline functional assessment. Baseline:  Goal status: DEFERRED       LONG TERM GOALS: Target date: 10/26/2022   The patient will be independent in a safe self progression of a home exercise program to promote further recovery of function   Baseline:  Goal status: MET   2. The patient will report a 75% improvement in pain levels with functional activities which are currently difficult including bed mobility, sit to stand, getting in/out of car and walking Baseline:  Goal status: on going   3.  The patient will have improved hip flexor and extensor muscle strength to 5/5 needed for lifting medium weight objects and carrying her infant  Baseline:  Goal status: on going   4.  Improved modified Oswestry score by 12% indicating improved function with less pain Baseline: TBD Goal status: DEFERRED     PLAN:   PT FREQUENCY: 3x/week   PT DURATION: 8 weeks   PLANNED INTERVENTIONS: Therapeutic exercises, Therapeutic activity, Neuromuscular re-education, Patient/Family education, Self Care, Joint mobilization, Aquatic Therapy, Spinal mobilization, Cryotherapy, Moist heat, Taping, and Manual therapy.   PLAN FOR NEXT SESSION: core and hip strength and flexibility, manual therapy as needed, internal pelvic floor work as needed, stretching for birth prep Start working on pregnancy strengthening: thoracic openers, hip openers, Hip IR, gentle TA activation, glute med work,  opp lat/glute strengthening, posture strengthening   Otelia Sergeant, PT, DPT 07/25/249:40 AM

## 2022-11-30 NOTE — Patient Instructions (Signed)
Loryn Haacke  11/30/2022   Your procedure is scheduled on:  12/14/2022  Arrive at 0800 at Entrance C on [address] at [facility]  and [employer]. You are invited to use the FREE valet parking or use the Visitor's parking deck.  Pick up the phone at the desk and dial [phone].  Call this number if you have problems the morning of surgery: [phone]  Remember:   Do not eat food:(After Midnight) Desps de medianoche.  Do not drink clear liquids: (After Midnight) Desps de medianoche.  Take these medicines the morning of surgery with A SIP OF WATER:  valtrex   Do not wear jewelry, make-up or nail polish.  Do not wear lotions, powders, or perfumes. Do not wear deodorant.  Do not shave 48 hours prior to surgery.  Do not bring valuables to the hospital.  [facility] is not   responsible for any belongings or valuables brought to the hospital.  Contacts, dentures or bridgework may not be worn into surgery.  Leave suitcase in the car. After surgery it may be brought to your room.  For patients admitted to the hospital, checkout time is 11:00 AM the day of              discharge.      Please read over the following fact sheets that you were given:     Preparing for Surgery

## 2022-11-30 NOTE — Telephone Encounter (Signed)
Preadmission screen  

## 2022-12-01 ENCOUNTER — Encounter (HOSPITAL_COMMUNITY): Payer: Self-pay

## 2022-12-01 ENCOUNTER — Encounter: Payer: Self-pay | Admitting: Physical Therapy

## 2022-12-01 ENCOUNTER — Ambulatory Visit: Payer: Commercial Managed Care - PPO | Admitting: Physical Therapy

## 2022-12-01 DIAGNOSIS — M62838 Other muscle spasm: Secondary | ICD-10-CM

## 2022-12-01 DIAGNOSIS — R262 Difficulty in walking, not elsewhere classified: Secondary | ICD-10-CM

## 2022-12-01 DIAGNOSIS — R102 Pelvic and perineal pain: Secondary | ICD-10-CM

## 2022-12-01 DIAGNOSIS — R293 Abnormal posture: Secondary | ICD-10-CM

## 2022-12-01 DIAGNOSIS — M6281 Muscle weakness (generalized): Secondary | ICD-10-CM | POA: Diagnosis not present

## 2022-12-01 DIAGNOSIS — O99891 Other specified diseases and conditions complicating pregnancy: Secondary | ICD-10-CM

## 2022-12-01 DIAGNOSIS — R279 Unspecified lack of coordination: Secondary | ICD-10-CM

## 2022-12-01 NOTE — Therapy (Signed)
OUTPATIENT PHYSICAL THERAPY FEMALE PELVIC EVALUATION   Patient Name: Monique Pruitt MRN: [id number] DOB:[birth year], 32 y.o., female Today's Date: 12/01/2022  END OF SESSION:  PT End of Session - 12/01/22 1552     Visit Number 25    Date for PT Re-Evaluation 01/10/23    Authorization Type UHC    PT Start Time 1305    PT Stop Time 1345    PT Time Calculation (min) 40 min    Activity Tolerance Patient tolerated treatment well    Behavior During Therapy [facility] for tasks assessed/performed             Past Medical History:  Diagnosis Date   Depression    Genital HSV    Migraine without aura 03/15/2021   Past Surgical History:  Procedure Laterality Date   WISDOM TOOTH EXTRACTION     Patient Active Problem List   Diagnosis Date Noted   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   Rh negative state in antepartum period 05/31/2022   History of HSV 05/29/2022   Encounter for supervision of normal pregnancy 05/25/2022   PCP: None REFERRING PROVIDER: Lennart Pall, MD   REFERRING DIAG: O99.891,M54.9 (ICD-10-CM) - Back pain affecting pregnancy in second trimester   THERAPY DIAG:  Other muscle spasm  Abnormal posture  Muscle weakness (generalized)  Unspecified lack of coordination  Rationale for Evaluation and Treatment Rehabilitation  PERTINENT HISTORY: H/O LBP and pelvic pain, left knee pain, 24 weeks and 1 day pregnant EDD: 12/20/22, depression  PRECAUTIONS: other, pregnant  SUBJECTIVE:                                                                                                                                                                                      SUBJECTIVE STATEMENT:  Very tender along my abdomin still and I am moving slowly today.  PAIN:  Are you having pain? NPRS scale:3/10 soreness at back and abdomen after appointment at OB's office yesterday.  Pain location:  Pain description:  Aggravating factors:  Relieving factors:   PERTINENT  HISTORY:  29wks  low-risk pregnancy and has Encounter for supervision of normal pregnancy; History of HSV; Rh negative state in antepartum period; and Choroid plexus cyst   Sexual abuse: No  BOWEL MOVEMENT: Pain with bowel movement: No Type of bowel movement:Type (Bristol Stool Scale) 4, Frequency slightly decreased but at least every 2 days, and Strain No Fully empty rectum: Yes:   Leakage: No Pads: No Fiber supplement: No  URINATION: Pain with urination: No Fully empty bladder: Yes:   Stream: Strong Urgency: Yes: but with pregnancy Frequency: 2-4 hours Leakage:  none Pads: No  INTERCOURSE: Pain with  intercourse:  slightly but not bad per pt, and not pain felt in hip/low back Ability to have vaginal penetration:  Yes:   Climax: not painful Marinoff Scale: 0/3  PREGNANCY: Vaginal deliveries 0 Tearing No C-section deliveries 0 Currently pregnant Yes:    PROLAPSE: None   OBJECTIVE:    PALPATION:   General TTP in bil gluteals, lumbar                 External Perineal Exam tension and TTP at Lt external pelvic floor                              Internal Pelvic Floor one small trigger point noted at Rt pubococcygeus however majority of pain and tension noted at lt side. Pt reports this is replicating her pain felt with hip/low back. Great tension noted at Lt pubococcygeus, iliococcygeus and bulbocavernosus.    Patient confirms identification and approves PT to assess internal pelvic floor and treatment Yes  PELVIC MMT:   MMT eval  Vaginal Strength Not assessed today due to pain  Internal Anal Sphincter   External Anal Sphincter   Puborectalis   Diastasis Recti   (Blank rows = not tested)        TONE: Increased   PROLAPSE: Not seen in hooklying     OBJECTIVE: (objective measures completed at initial evaluation unless otherwise dated)  DIAGNOSTIC FINDINGS:  N/A   SCREENING FOR RED FLAGS: Neg   COGNITION: Overall cognitive status: Within functional  limits for tasks assessed                          SENSATION: WFL   MUSCLE LENGTH: HS: mild tightness bil Piriformis:WNL   POSTURE: No Significant postural limitations   PALPATION: TTP in bil gluteals, lumbar   LUMBAR ROM: WNL, some tightness with left rotation 25% deficit   LOWER EXTREMITY ROM:   WNL   LOWER EXTREMITY MMT:  *pelvic pain unless noted otherwise   MMT Right eval Left eval  Hip flexion 4+ with pain right LB 5  Hip extension 5 5  Hip abduction 5* 4*r  Hip adduction 5 4+  Hip internal rotation      Hip external rotation      Knee flexion 5 4+  Knee extension 5 4+*  Ankle dorsiflexion 5 5   (Blank rows = not tested)   LUMBAR SPECIAL TESTS:  FABER test: Negative, negative SIJ sompression/distraction   FUNCTIONAL TESTS:  5 times sit to stand: 23.88 sec pelvic/low pain at 6/10   MODIFIED OSWESTRY: TBD     TODAY'S TREATMENT:     12/01/22:Pt arrives for aquatic physical therapy. Treatment took place in 3.5-5.5 feet of water. Water temperature was. Pt entered the pool via stairs with mod use of rails and extra caution. Pt requires buoyancy of water for support and to offload joints with strengthening exercises.  Pt utilizes viscosity of the water required for strengthening. Seated water bench with 75% submersion Pt performed seated LE AROM exercises 20x in all planes, with concurrent discussion of pain and current status.  Vertical decompression hang with yellow noodle x 8 min initially for pain and soreness. 75% water depth water walking in all direction 6  lengths with slower UE movements. 8 additional minutes in decompression hang as pt got very uncomfortable with baby kicking her vigouously.   11/21/22: Therapeutic exercise:  On yoga ball: hip  shift x10 each, circles x10 each Uneven hip shift in quad 2x30s each side Quad hip IR bil 2x30s Puppy pose 2x30s Half kneel green band resisted trunk rotation 2x10 each Cactus stretch x10 Trunk rotation in quad  2x30s  11/30/22: Manual: in sidelying with pillows for comfort. Addaday in L1 at glute, thoracic and lumbar paraspinals with light pressure for improved tissue relaxation and improved mobility. Pt reports she was sore after appointment yesterday and gentle mobility and manual completed today for pt comfort. Manual hands on gentle soft tissue completed as well for further tissue relaxation, this was all completed bil. pt reporting improvement in soreness at back at end of session.    PATIENT EDUCATION:  Education details: PT eval findings, anticipated POC, HEP, postural awareness, education on log rolling for bed mobility, and discussed acquiring a belly band for support.   Person educated: Patient Education method: Explanation, Demonstration, Verbal cues, and Handouts Education comprehension: verbalized understanding and returned demonstration   HOME EXERCISE PROGRAM:  Access Code: [email or web address] URL: [email or web address] Date: 09/27/2022 Prepared by: Tresa Endo Updated 10/17/22 by HALEY  ASSESSMENT:   CLINICAL IMPRESSION: Pt remains very sore from her procedure so treatment was focused on pain relieving positions and activities. This was helpful until the baby began to kick vigorously and pt couldn't do much but hold onto noodle. This position helped her get through the discomfort.   OBJECTIVE IMPAIRMENTS: decreased activity tolerance, decreased ROM, decreased strength, increased muscle spasms, impaired flexibility, and pain.    ACTIVITY LIMITATIONS: sitting, standing, transfers, and locomotion level   PARTICIPATION LIMITATIONS: occupation   PERSONAL FACTORS: Past/current experiences and 1-2 comorbidities: previous LB/pelvic pain and left knee pain  are also affecting patient's functional outcome.    REHAB POTENTIAL: Excellent   CLINICAL DECISION MAKING: Stable/uncomplicated   EVALUATION COMPLEXITY: Low     GOALS: Goals reviewed with patient? Yes   SHORT TERM GOALS:  Target date: 09/14/2022   The patient will demonstrate knowledge of basic self care strategies and exercises to promote healing including log-rolling to get in/out of bed   Baseline: Goal status:MET   2.  The patient will report a 30% improvement in pain levels with functional activities which are currently difficult including sit to stand, turning over in bed, getting in/out of car and walking. Baseline: 40% better (09/21/22) Goal status: MET    3.  Patient to complete Modified Oswestry by 2nd land visit for baseline functional assessment. Baseline:  Goal status: DEFERRED       LONG TERM GOALS: Target date: 10/26/2022   The patient will be independent in a safe self progression of a home exercise program to promote further recovery of function   Baseline:  Goal status: MET   2. The patient will report a 75% improvement in pain levels with functional activities which are currently difficult including bed mobility, sit to stand, getting in/out of car and walking Baseline:  Goal status: on going   3.  The patient will have improved hip flexor and extensor muscle strength to 5/5 needed for lifting medium weight objects and carrying her infant  Baseline:  Goal status: on going   4.  Improved modified Oswestry score by 12% indicating improved function with less pain Baseline: TBD Goal status: DEFERRED     PLAN:   PT FREQUENCY: 3x/week   PT DURATION: 8 weeks   PLANNED INTERVENTIONS: Therapeutic exercises, Therapeutic activity, Neuromuscular re-education, Patient/Family education, Self Care, Joint mobilization, Aquatic Therapy, Spinal mobilization, Cryotherapy, Moist heat, Taping, and Manual  therapy.   PLAN FOR NEXT SESSION: Pt would like to discuss and get more education on scar massage post C-section  Ane Payment, PTA 12/01/22 3:53 PM

## 2022-12-04 ENCOUNTER — Encounter: Payer: Self-pay | Admitting: Obstetrics & Gynecology

## 2022-12-04 ENCOUNTER — Ambulatory Visit (INDEPENDENT_AMBULATORY_CARE_PROVIDER_SITE_OTHER): Payer: Commercial Managed Care - PPO | Admitting: Obstetrics & Gynecology

## 2022-12-04 VITALS — BP 107/75 | HR 96 | Wt 170.0 lb

## 2022-12-04 DIAGNOSIS — Z6791 Unspecified blood type, Rh negative: Secondary | ICD-10-CM

## 2022-12-04 DIAGNOSIS — Z8619 Personal history of other infectious and parasitic diseases: Secondary | ICD-10-CM

## 2022-12-04 DIAGNOSIS — Z3493 Encounter for supervision of normal pregnancy, unspecified, third trimester: Secondary | ICD-10-CM

## 2022-12-04 DIAGNOSIS — O26899 Other specified pregnancy related conditions, unspecified trimester: Secondary | ICD-10-CM

## 2022-12-04 NOTE — Progress Notes (Signed)
   PRENATAL VISIT NOTE  Subjective:  Monique Pruitt is a 32 y.o. G1P0000 at [redacted]w[redacted]d being seen today for ongoing prenatal care.  She is currently monitored for the following issues for this low-risk pregnancy and has Encounter for supervision of normal pregnancy; History of HSV; Rh negative state in antepartum period; and Choroid plexus cyst on their problem list.  Patient reports no complaints.  Contractions: Irritability. Vag. Bleeding: None.  Movement: Present. Denies leaking of fluid.   The following portions of the patient's history were reviewed and updated as appropriate: allergies, current medications, past family history, past medical history, past social history, past surgical history and problem list.   Objective:   Vitals:   12/04/22 0827  BP: 107/75  Pulse: 96  Weight: 170 lb (77.1 kg)    Fetal Status: Fetal Heart Rate (bpm): 140   Movement: Present     General:  Alert, oriented and cooperative. Patient is in no acute distress.  Skin: Skin is warm and dry. No rash noted.   Cardiovascular: Normal heart rate noted  Respiratory: Normal respiratory effort, no problems with respiration noted  Abdomen: Soft, gravid, appropriate for gestational age.  Pain/Pressure: Absent     Pelvic: Cervical exam deferred        Extremities: Normal range of motion.  Edema: Trace  Mental Status: Normal mood and affect. Normal behavior. Normal judgment and thought content.   Assessment and Plan:  Pregnancy: G1P0000 at [redacted]w[redacted]d 1. Rh negative state in antepartum period Received rhogam at version  2. History of HSV Taking Valtrex daily  3. Encounter for supervision of normal pregnancy in third trimester, unspecified gravidity C/s scheudled next Thursday.  [place] confirmed breech today.  (Failed version last Tuesday)  Term labor symptoms and general obstetric precautions including but not limited to vaginal bleeding, contractions, leaking of fluid and fetal movement were reviewed in detail  with the patient. Please refer to After Visit Summary for other counseling recommendations.   No follow-ups on file.  Future Appointments  Date Time Provider Department Center  12/06/2022  8:00 AM Barbaraann Faster, PT [facility] None  12/08/2022  1:00 PM Manfred Shirts, PTA [facility] None  12/12/2022  9:30 AM [facility] 1 [facility] None    Elsie Lincoln, MD

## 2022-12-06 ENCOUNTER — Ambulatory Visit: Payer: Commercial Managed Care - PPO | Admitting: Physical Therapy

## 2022-12-06 DIAGNOSIS — R279 Unspecified lack of coordination: Secondary | ICD-10-CM

## 2022-12-06 DIAGNOSIS — M62838 Other muscle spasm: Secondary | ICD-10-CM

## 2022-12-06 DIAGNOSIS — R293 Abnormal posture: Secondary | ICD-10-CM

## 2022-12-06 DIAGNOSIS — M6281 Muscle weakness (generalized): Secondary | ICD-10-CM | POA: Diagnosis not present

## 2022-12-06 NOTE — Therapy (Signed)
OUTPATIENT PHYSICAL THERAPY FEMALE PELVIC EVALUATION   Patient Name: Monique Pruitt MRN: [id number] DOB:[birth year], 32 y.o., female Today's Date: 12/06/2022  END OF SESSION:  PT End of Session - 12/06/22 0804     Visit Number 26    Date for PT Re-Evaluation 01/10/23    Authorization Type U[employer]    PT Start Time 0800    PT Stop Time 0843    PT Time Calculation (min) 43 min    Activity Tolerance Patient tolerated treatment well    Behavior During Therapy [facility] for tasks assessed/performed             Past Medical History:  Diagnosis Date   Depression    Genital HSV    Migraine without aura 03/15/2021   Past Surgical History:  Procedure Laterality Date   WISDOM TOOTH EXTRACTION     Patient Active Problem List   Diagnosis Date Noted   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   Rh negative state in antepartum period 05/31/2022   History of HSV 05/29/2022   Encounter for supervision of normal pregnancy 05/25/2022   PCP: None REFERRING PROVIDER: Lennart Pall, MD   REFERRING DIAG: O99.891,M54.9 (ICD-10-CM) - Back pain affecting pregnancy in second trimester   THERAPY DIAG:  Other muscle spasm  Abnormal posture  Muscle weakness (generalized)  Unspecified lack of coordination  Rationale for Evaluation and Treatment Rehabilitation  PERTINENT HISTORY: H/O LBP and pelvic pain, left knee pain, 24 weeks and 1 day pregnant EDD: 12/20/22, depression  PRECAUTIONS: other, pregnant  SUBJECTIVE:                                                                                                                                                                                      SUBJECTIVE STATEMENT:   Pt reports minimal pain, pool has helped a lot. Stretching is most comfortable.   PAIN:  Are you having pain? NPRS scale:2/10   Pain location:  Pain description:  Aggravating factors:  Relieving factors:   PERTINENT HISTORY:  29wks  low-risk pregnancy and has Encounter  for supervision of normal pregnancy; History of HSV; Rh negative state in antepartum period; and Choroid plexus cyst   Sexual abuse: No  BOWEL MOVEMENT: Pain with bowel movement: No Type of bowel movement:Type (Bristol Stool Scale) 4, Frequency slightly decreased but at least every 2 days, and Strain No Fully empty rectum: Yes:   Leakage: No Pads: No Fiber supplement: No  URINATION: Pain with urination: No Fully empty bladder: Yes:   Stream: Strong Urgency: Yes: but with pregnancy Frequency: 2-4 hours Leakage:  none Pads: No  INTERCOURSE: Pain with intercourse:  slightly but not bad per  pt, and not pain felt in hip/low back Ability to have vaginal penetration:  Yes:   Climax: not painful Marinoff Scale: 0/3  PREGNANCY: Vaginal deliveries 0 Tearing No C-section deliveries 0 Currently pregnant Yes:    PROLAPSE: None   OBJECTIVE:    PALPATION:   General TTP in bil gluteals, lumbar                 External Perineal Exam tension and TTP at Lt external pelvic floor                              Internal Pelvic Floor one small trigger point noted at Rt pubococcygeus however majority of pain and tension noted at lt side. Pt reports this is replicating her pain felt with hip/low back. Great tension noted at Lt pubococcygeus, iliococcygeus and bulbocavernosus.    Patient confirms identification and approves PT to assess internal pelvic floor and treatment Yes  PELVIC MMT:   MMT eval  Vaginal Strength Not assessed today due to pain  Internal Anal Sphincter   External Anal Sphincter   Puborectalis   Diastasis Recti   (Blank rows = not tested)        TONE: Increased   PROLAPSE: Not seen in hooklying     OBJECTIVE: (objective measures completed at initial evaluation unless otherwise dated)  DIAGNOSTIC FINDINGS:  N/A   SCREENING FOR RED FLAGS: Neg   COGNITION: Overall cognitive status: Within functional limits for tasks assessed                           SENSATION: WFL   MUSCLE LENGTH: HS: mild tightness bil Piriformis:WNL   POSTURE: No Significant postural limitations   PALPATION: TTP in bil gluteals, lumbar   LUMBAR ROM: WNL, some tightness with left rotation 25% deficit   LOWER EXTREMITY ROM:   WNL   LOWER EXTREMITY MMT:  *pelvic pain unless noted otherwise   MMT Right eval Left eval  Hip flexion 4+ with pain right LB 5  Hip extension 5 5  Hip abduction 5* 4*r  Hip adduction 5 4+  Hip internal rotation      Hip external rotation      Knee flexion 5 4+  Knee extension 5 4+*  Ankle dorsiflexion 5 5   (Blank rows = not tested)   LUMBAR SPECIAL TESTS:  FABER test: Negative, negative SIJ sompression/distraction   FUNCTIONAL TESTS:  5 times sit to stand: 23.88 sec pelvic/low pain at 6/10   MODIFIED OSWESTRY: TBD     TODAY'S TREATMENT:    12/06/22: Butterfly stretch 3x30s ITB stretch 2x30s each Cat/cows x10   Tail wags x10 Ant/post rocking with hip IR rotation x10 Hip flexor stretches in hal kneeling with trunk side bend 2x30s Manual at bil proximal quads with tension noted here and pt reports feeling it into anterior pelvis - good release noted with soft tissue release and pt reported feeling much better post treatment   PATIENT EDUCATION:  Education details: PT eval findings, anticipated POC, HEP, postural awareness, education on log rolling for bed mobility, and discussed acquiring a belly band for support.   Person educated: Patient Education method: Explanation, Demonstration, Verbal cues, and Handouts Education comprehension: verbalized understanding and returned demonstration   HOME EXERCISE PROGRAM:  Access Code: [email or web address] URL: [email or web address] Date: 09/27/2022 Prepared by: Tresa Endo Updated 10/17/22 by Muhammed Teutsch  ASSESSMENT:   CLINICAL IMPRESSION: pt  presents for treatment today,session focused on mobility at hips and pelvis and low back within pt comfort. Pt light manual at bil  quads for decreased strain at pelvis and decreased pain, pt reports this helped and denied pain at end of session. Pt had improvement overall with symptoms, no longer having intense pelvic pain and has one additional appt with aquatic for pain management. Lexia continues to demonstrate potential for improvement and would benefit from continued skilled therapy to address impairments.    OBJECTIVE IMPAIRMENTS: decreased activity tolerance, decreased ROM, decreased strength, increased muscle spasms, impaired flexibility, and pain.    ACTIVITY LIMITATIONS: sitting, standing, transfers, and locomotion level   PARTICIPATION LIMITATIONS: occupation   PERSONAL FACTORS: Past/current experiences and 1-2 comorbidities: previous LB/pelvic pain and left knee pain  are also affecting patient's functional outcome.    REHAB POTENTIAL: Excellent   CLINICAL DECISION MAKING: Stable/uncomplicated   EVALUATION COMPLEXITY: Low     GOALS: Goals reviewed with patient? Yes   SHORT TERM GOALS: Target date: 09/14/2022   The patient will demonstrate knowledge of basic self care strategies and exercises to promote healing including log-rolling to get in/out of bed   Baseline: Goal status:MET   2.  The patient will report a 30% improvement in pain levels with functional activities which are currently difficult including sit to stand, turning over in bed, getting in/out of car and walking. Baseline: 40% better (09/21/22) Goal status: MET    3.  Patient to complete Modified Oswestry by 2nd land visit for baseline functional assessment. Baseline:  Goal status: DEFERRED       LONG TERM GOALS: Target date: 10/26/2022   The patient will be independent in a safe self progression of a home exercise program to promote further recovery of function   Baseline:  Goal status: MET   2. The patient will report a 75% improvement in pain levels with functional activities which are currently difficult including bed  mobility, sit to stand, getting in/out of car and walking Baseline:  Goal status: on going   3.  The patient will have improved hip flexor and extensor muscle strength to 5/5 needed for lifting medium weight objects and carrying her infant  Baseline:  Goal status: MET   4.  Improved modified Oswestry score by 12% indicating improved function with less pain Baseline: TBD Goal status: DEFERRED     PLAN:   PT FREQUENCY: 3x/week   PT DURATION: 8 weeks   PLANNED INTERVENTIONS: Therapeutic exercises, Therapeutic activity, Neuromuscular re-education, Patient/Family education, Self Care, Joint mobilization, Aquatic Therapy, Spinal mobilization, Cryotherapy, Moist heat, Taping, and Manual therapy.   PLAN FOR NEXT SESSION: core and hip strength and flexibility, manual therapy as needed, internal pelvic floor work as needed, stretching for birth prep Start working on pregnancy strengthening: thoracic openers, hip openers, Hip IR, gentle TA activation, glute med work, opp Tax inspector, posture strengthening   Otelia Sergeant, PT, DPT 07/31/249:32 AM

## 2022-12-07 NOTE — Therapy (Addendum)
OUTPATIENT PHYSICAL THERAPY FEMALE PELVIC PROGRESS NOTE   Patient Name: Monique Pruitt MRN: [id number] DOB:30-Nov-1990, 30 y.o., female Today's Date: 12/08/2022  END OF SESSION:  PT End of Session - 12/08/22 1257     Visit Number 27    Date for PT Re-Evaluation 01/10/23    Authorization Type UHC    PT Start Time 1257    PT Stop Time 1355    PT Time Calculation (min) 58 min    Activity Tolerance Patient tolerated treatment well    Behavior During Therapy WFL for tasks assessed/performed              Past Medical History:  Diagnosis Date   Depression    Genital HSV    Migraine without aura 03/15/2021   Past Surgical History:  Procedure Laterality Date   WISDOM TOOTH EXTRACTION     Patient Active Problem List   Diagnosis Date Noted   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   Rh negative state in antepartum period 05/31/2022   History of HSV 05/29/2022   Encounter for supervision of normal pregnancy 05/25/2022   PCP: None REFERRING PROVIDER: Lennart Pall, MD   REFERRING DIAG: O99.891,M54.9 (ICD-10-CM) - Back pain affecting pregnancy in second trimester   THERAPY DIAG:  Other muscle spasm  Abnormal posture  Muscle weakness (generalized)  Unspecified lack of coordination  Rationale for Evaluation and Treatment Rehabilitation  PERTINENT HISTORY: H/O LBP and pelvic pain, left knee pain, 24 weeks and 1 day pregnant EDD: 12/20/22, depression  PRECAUTIONS: other, pregnant  SUBJECTIVE:                                                                                                                                                                                      SUBJECTIVE STATEMENT:  I'm back to my usual pain which fluctuates between 2-4. Being in the water has been very beneficial.   PAIN:  Are you having pain? NPRS scale:2/10   Pain location:  Pain description:  Aggravating factors:  Relieving factors:   PERTINENT HISTORY:  29wks  low-risk  pregnancy and has Encounter for supervision of normal pregnancy; History of HSV; Rh negative state in antepartum period; and Choroid plexus cyst   Sexual abuse: No  BOWEL MOVEMENT: Pain with bowel movement: No Type of bowel movement:Type (Bristol Stool Scale) 4, Frequency slightly decreased but at least every 2 days, and Strain No Fully empty rectum: Yes:   Leakage: No Pads: No Fiber supplement: No  URINATION: Pain with urination: No Fully empty bladder: Yes:   Stream: Strong Urgency: Yes: but with pregnancy Frequency: 2-4 hours Leakage:  none Pads: No  INTERCOURSE: Pain with intercourse:  slightly but not bad per pt, and not pain felt in hip/low back Ability to have vaginal penetration:  Yes:   Climax: not painful Marinoff Scale: 0/3  PREGNANCY: Vaginal deliveries 0 Tearing No C-section deliveries 0 Currently pregnant Yes:    PROLAPSE: None   OBJECTIVE:    PALPATION:   General TTP in bil gluteals, lumbar                 External Perineal Exam tension and TTP at Lt external pelvic floor                              Internal Pelvic Floor one small trigger point noted at Rt pubococcygeus however majority of pain and tension noted at lt side. Pt reports this is replicating her pain felt with hip/low back. Great tension noted at Lt pubococcygeus, iliococcygeus and bulbocavernosus.    Patient confirms identification and approves PT to assess internal pelvic floor and treatment Yes  PELVIC MMT:   MMT eval  Vaginal Strength Not assessed today due to pain  Internal Anal Sphincter   External Anal Sphincter   Puborectalis   Diastasis Recti   (Blank rows = not tested)        TONE: Increased   PROLAPSE: Not seen in hooklying     OBJECTIVE: (objective measures completed at initial evaluation unless otherwise dated)  DIAGNOSTIC FINDINGS:  N/A   SCREENING FOR RED FLAGS: Neg   COGNITION: Overall cognitive status: Within functional limits for tasks assessed                           SENSATION: WFL   MUSCLE LENGTH: HS: mild tightness bil Piriformis:WNL   POSTURE: No Significant postural limitations   PALPATION: TTP in bil gluteals, lumbar   LUMBAR ROM: WNL, some tightness with left rotation 25% deficit   LOWER EXTREMITY ROM:   WNL   LOWER EXTREMITY MMT:  *pelvic pain unless noted otherwise   MMT Right eval Left eval  Hip flexion 4+ with pain right LB 5  Hip extension 5 5  Hip abduction 5* 4*r  Hip adduction 5 4+  Hip internal rotation      Hip external rotation      Knee flexion 5 4+  Knee extension 5 4+*  Ankle dorsiflexion 5 5   (Blank rows = not tested)   LUMBAR SPECIAL TESTS:  FABER test: Negative, negative SIJ sompression/distraction   FUNCTIONAL TESTS:  5 times sit to stand: 23.88 sec pelvic/low pain at 6/10   MODIFIED OSWESTRY: TBD     TODAY'S TREATMENT:     12/07/22:Pt arrives for aquatic physical therapy. Treatment took place in 3.5-5.5 feet of water. Water temperature was 91 degrees F. Pt entered the pool via stairs with mod use of rails and extra caution. Pt requires buoyancy of water for support and to offload joints with strengthening exercises.  Pt utilizes viscosity of the water required for strengthening. Seated water bench with 75% submersion Pt performed seated LE AROM exercises 20x in all planes, with concurrent discussion of pain and current status. 75% water depth water walking in all direction 10 lengths with UE movements using had floats.High knee marching with small noodle 4 lengths. Post pelvic tilt against wall with yellow hand floats and first stage labor breathing core/lat press 3x10. A few min of vertical decompression hang followed by LE movements x5 min. Final 5  min decompression/pelvic relaxation float.     12/06/22: Butterfly stretch 3x30s ITB stretch 2x30s each Cat/cows x10   Tail wags x10 Ant/post rocking with hip IR rotation x10 Hip flexor stretches in hal kneeling with trunk side  bend 2x30s Manual at bil proximal quads with tension noted here and pt reports feeling it into anterior pelvis - good release noted with soft tissue release and pt reported feeling much better post treatment   PATIENT EDUCATION:  Education details: PT eval findings, anticipated POC, HEP, postural awareness, education on log rolling for bed mobility, and discussed acquiring a belly band for support.   Person educated: Patient Education method: Explanation, Demonstration, Verbal cues, and Handouts Education comprehension: verbalized understanding and returned demonstration   HOME EXERCISE PROGRAM:  Access Code: [email or web address] URL: [email or web address] Date: 09/27/2022 Prepared by: Tresa Endo Updated 10/17/22 by HALEY  ASSESSMENT:   CLINICAL IMPRESSION: Pt arrives for final aquatic treatment. Pt is scheduled for C-section next Thursday and follows up with MD on Monday.   OBJECTIVE IMPAIRMENTS: decreased activity tolerance, decreased ROM, decreased strength, increased muscle spasms, impaired flexibility, and pain.    ACTIVITY LIMITATIONS: sitting, standing, transfers, and locomotion level   PARTICIPATION LIMITATIONS: occupation   PERSONAL FACTORS: Past/current experiences and 1-2 comorbidities: previous LB/pelvic pain and left knee pain  are also affecting patient's functional outcome.    REHAB POTENTIAL: Excellent   CLINICAL DECISION MAKING: Stable/uncomplicated   EVALUATION COMPLEXITY: Low     GOALS: Goals reviewed with patient? Yes   SHORT TERM GOALS: Target date: 09/14/2022   The patient will demonstrate knowledge of basic self care strategies and exercises to promote healing including log-rolling to get in/out of bed   Baseline: Goal status:MET   2.  The patient will report a 30% improvement in pain levels with functional activities which are currently difficult including sit to stand, turning over in bed, getting in/out of car and walking. Baseline: 40% better  (09/21/22) Goal status: MET    3.  Patient to complete Modified Oswestry by 2nd land visit for baseline functional assessment. Baseline:  Goal status: DEFERRED       LONG TERM GOALS: Target date: 10/26/2022   The patient will be independent in a safe self progression of a home exercise program to promote further recovery of function   Baseline:  Goal status: MET   2. The patient will report a 75% improvement in pain levels with functional activities which are currently difficult including bed mobility, sit to stand, getting in/out of car and walking Baseline:  Goal status: Depending on the day of course 50%-75% range. Partially met.   3.  The patient will have improved hip flexor and extensor muscle strength to 5/5 needed for lifting medium weight objects and carrying her infant  Baseline:  Goal status: MET   4.  Improved modified Oswestry score by 12% indicating improved function with less pain Baseline: TBD Goal status: DEFERRED     PLAN:   PT FREQUENCY: 3x/week   PT DURATION: 8 weeks   PLANNED INTERVENTIONS: Therapeutic exercises, Therapeutic activity, Neuromuscular re-education, Patient/Family education, Self Care, Joint mobilization, Aquatic Therapy, Spinal mobilization, Cryotherapy, Moist heat, Taping, and Manual therapy.   PLAN FOR NEXT SESSION: Discharge from PT  Ane Payment, PTA 12/08/22 1:57 PM   PHYSICAL THERAPY DISCHARGE SUMMARY  Visits from Start of Care: 27  Current functional level related to goals / functional outcomes: Does still have low levels of pain at pelvis but inconsistent and pleased with outcome with c-section scheduled  for 12/14/22. Pt plans to follow up postpartum.   Remaining deficits: Low level pain at pelvis   Education / Equipment: HEP   Patient agrees to discharge. Patient goals were partially met. Patient is being discharged due to being pleased with the current functional level. Otelia Sergeant, PT, DPT 08/05/248:01 AM

## 2022-12-08 ENCOUNTER — Ambulatory Visit: Payer: Commercial Managed Care - PPO | Attending: Obstetrics and Gynecology | Admitting: Physical Therapy

## 2022-12-08 ENCOUNTER — Encounter: Payer: Self-pay | Admitting: Physical Therapy

## 2022-12-08 DIAGNOSIS — M62838 Other muscle spasm: Secondary | ICD-10-CM | POA: Diagnosis present

## 2022-12-08 DIAGNOSIS — O99891 Other specified diseases and conditions complicating pregnancy: Secondary | ICD-10-CM | POA: Diagnosis present

## 2022-12-08 DIAGNOSIS — M549 Dorsalgia, unspecified: Secondary | ICD-10-CM | POA: Insufficient documentation

## 2022-12-08 DIAGNOSIS — R102 Pelvic and perineal pain: Secondary | ICD-10-CM | POA: Diagnosis present

## 2022-12-08 DIAGNOSIS — R262 Difficulty in walking, not elsewhere classified: Secondary | ICD-10-CM | POA: Diagnosis present

## 2022-12-08 DIAGNOSIS — R279 Unspecified lack of coordination: Secondary | ICD-10-CM

## 2022-12-08 DIAGNOSIS — R293 Abnormal posture: Secondary | ICD-10-CM | POA: Diagnosis present

## 2022-12-08 DIAGNOSIS — M6281 Muscle weakness (generalized): Secondary | ICD-10-CM

## 2022-12-08 DIAGNOSIS — O26892 Other specified pregnancy related conditions, second trimester: Secondary | ICD-10-CM | POA: Diagnosis present

## 2022-12-11 ENCOUNTER — Other Ambulatory Visit: Payer: Self-pay | Admitting: Family Medicine

## 2022-12-11 ENCOUNTER — Ambulatory Visit (INDEPENDENT_AMBULATORY_CARE_PROVIDER_SITE_OTHER): Payer: Commercial Managed Care - PPO | Admitting: Obstetrics & Gynecology

## 2022-12-11 VITALS — BP 116/80 | HR 94 | Wt 170.0 lb

## 2022-12-11 DIAGNOSIS — Z3493 Encounter for supervision of normal pregnancy, unspecified, third trimester: Secondary | ICD-10-CM

## 2022-12-11 DIAGNOSIS — O329XX0 Maternal care for malpresentation of fetus, unspecified, not applicable or unspecified: Secondary | ICD-10-CM

## 2022-12-11 DIAGNOSIS — Z3A38 38 weeks gestation of pregnancy: Secondary | ICD-10-CM

## 2022-12-11 DIAGNOSIS — O321XX0 Maternal care for breech presentation, not applicable or unspecified: Secondary | ICD-10-CM

## 2022-12-11 DIAGNOSIS — Z3403 Encounter for supervision of normal first pregnancy, third trimester: Secondary | ICD-10-CM

## 2022-12-11 DIAGNOSIS — Z8619 Personal history of other infectious and parasitic diseases: Secondary | ICD-10-CM

## 2022-12-11 NOTE — Progress Notes (Signed)
   PRENATAL VISIT NOTE  Subjective:  Monique Pruitt is a 32 y.o. G1P0000 at [redacted]w[redacted]d being seen today for ongoing prenatal care.  She is currently monitored for the following issues for this low-risk pregnancy and has Encounter for supervision of normal pregnancy; History of HSV; Rh negative state in antepartum period; and Choroid plexus cyst on their problem list.  Patient reports no complaints.  Contractions: Irritability.  .  Movement: Present. Denies leaking of fluid.   The following portions of the patient's history were reviewed and updated as appropriate: allergies, current medications, past family history, past medical history, past social history, past surgical history and problem list.   Objective:   Vitals:   12/11/22 0814  BP: 116/80  Pulse: 94  Weight: 170 lb (77.1 kg)    Fetal Status: Fetal Heart Rate (bpm): 147   Movement: Present     General:  Alert, oriented and cooperative. Patient is in no acute distress.  Skin: Skin is warm and dry. No rash noted.   Cardiovascular: Normal heart rate noted  Respiratory: Normal respiratory effort, no problems with respiration noted  Abdomen: Soft, gravid, appropriate for gestational age.  Pain/Pressure: Absent     Pelvic: Cervical exam deferred        Extremities: Normal range of motion.  Edema: None  Mental Status: Normal mood and affect. Normal behavior. Normal judgment and thought content.   Assessment and Plan:  Pregnancy: G1P0000 at [redacted]w[redacted]d 1. Encounter for supervision of normal pregnancy in third trimester, unspecified gravidity Breech scheduled for c/s  2. History of HSV Continue Valtrex  Term labor symptoms and general obstetric precautions including but not limited to vaginal bleeding, contractions, leaking of fluid and fetal movement were reviewed in detail with the patient. Please refer to After Visit Summary for other counseling recommendations.   No follow-ups on file.  Future Appointments  Date Time  Provider Department Center  12/12/2022  9:30 AM [facility] PAT 1 [facility] None  12/21/2022 10:00 AM [facility] NURSE [facility]    Elsie Lincoln, MD

## 2022-12-12 ENCOUNTER — Encounter: Payer: Self-pay | Admitting: *Deleted

## 2022-12-12 ENCOUNTER — Ambulatory Visit (HOSPITAL_COMMUNITY)
Admission: RE | Admit: 2022-12-12 | Discharge: 2022-12-12 | Disposition: A | Discharge status: Home / Self Care | Payer: Commercial Managed Care - PPO | Source: Ambulatory Visit | Attending: Family Medicine | Admitting: Family Medicine

## 2022-12-12 DIAGNOSIS — O329XX0 Maternal care for malpresentation of fetus, unspecified, not applicable or unspecified: Secondary | ICD-10-CM | POA: Insufficient documentation

## 2022-12-12 DIAGNOSIS — Z01812 Encounter for preprocedural laboratory examination: Secondary | ICD-10-CM | POA: Insufficient documentation

## 2022-12-12 DIAGNOSIS — Z3A39 39 weeks gestation of pregnancy: Secondary | ICD-10-CM | POA: Insufficient documentation

## 2022-12-12 LAB — CBC
HCT: 36.4 % (ref 36.0–46.0)
Hemoglobin: 12.1 g/dL (ref 12.0–15.0)
MCH: 30 pg (ref 26.0–34.0)
MCHC: 33.2 g/dL (ref 30.0–36.0)
MCV: 90.1 fL (ref 80.0–100.0)
Platelets: 150 10*3/uL (ref 150–400)
RBC: 4.04 MIL/uL (ref 3.87–5.11)
RDW: 13.4 % (ref 11.5–15.5)
WBC: 7.3 10*3/uL (ref 4.0–10.5)
nRBC: 0 % (ref 0.0–0.2)

## 2022-12-12 LAB — TYPE AND SCREEN
ABO/RH(D): O NEG
Antibody Screen: POSITIVE

## 2022-12-12 LAB — RPR: RPR Ser Ql: NONREACTIVE

## 2022-12-12 LAB — RAPID HIV SCREEN (HIV 1/2 AB+AG)
HIV 1/2 Antibodies: NONREACTIVE
HIV-1 P24 Antigen - HIV24: NONREACTIVE

## 2022-12-14 ENCOUNTER — Inpatient Hospital Stay (HOSPITAL_COMMUNITY)
Admission: RE | Admit: 2022-12-14 | Discharge: 2022-12-16 | DRG: 787 | Disposition: A | Discharge status: Home / Self Care | Payer: Commercial Managed Care - PPO | Attending: Family Medicine | Admitting: Family Medicine

## 2022-12-14 ENCOUNTER — Other Ambulatory Visit: Payer: Self-pay

## 2022-12-14 ENCOUNTER — Inpatient Hospital Stay (HOSPITAL_COMMUNITY): Payer: Commercial Managed Care - PPO | Admitting: Anesthesiology

## 2022-12-14 ENCOUNTER — Encounter (HOSPITAL_COMMUNITY)
Admission: RE | Disposition: A | Discharge status: Home / Self Care | Payer: Self-pay | Source: Home / Self Care | Attending: Family Medicine

## 2022-12-14 ENCOUNTER — Encounter (HOSPITAL_COMMUNITY): Payer: Self-pay | Admitting: Family Medicine

## 2022-12-14 DIAGNOSIS — O99354 Diseases of the nervous system complicating childbirth: Secondary | ICD-10-CM | POA: Diagnosis present

## 2022-12-14 DIAGNOSIS — O9832 Other infections with a predominantly sexual mode of transmission complicating childbirth: Secondary | ICD-10-CM | POA: Diagnosis present

## 2022-12-14 DIAGNOSIS — O9081 Anemia of the puerperium: Secondary | ICD-10-CM | POA: Diagnosis not present

## 2022-12-14 DIAGNOSIS — O321XX0 Maternal care for breech presentation, not applicable or unspecified: Principal | ICD-10-CM | POA: Diagnosis present

## 2022-12-14 DIAGNOSIS — Z3490 Encounter for supervision of normal pregnancy, unspecified, unspecified trimester: Principal | ICD-10-CM

## 2022-12-14 DIAGNOSIS — G930 Cerebral cysts: Secondary | ICD-10-CM | POA: Diagnosis present

## 2022-12-14 DIAGNOSIS — Z8619 Personal history of other infectious and parasitic diseases: Secondary | ICD-10-CM | POA: Diagnosis present

## 2022-12-14 DIAGNOSIS — Z3A39 39 weeks gestation of pregnancy: Secondary | ICD-10-CM

## 2022-12-14 DIAGNOSIS — O26893 Other specified pregnancy related conditions, third trimester: Secondary | ICD-10-CM | POA: Diagnosis present

## 2022-12-14 DIAGNOSIS — A6000 Herpesviral infection of urogenital system, unspecified: Secondary | ICD-10-CM | POA: Diagnosis present

## 2022-12-14 DIAGNOSIS — Z6791 Unspecified blood type, Rh negative: Secondary | ICD-10-CM

## 2022-12-14 DIAGNOSIS — O26899 Other specified pregnancy related conditions, unspecified trimester: Secondary | ICD-10-CM

## 2022-12-14 DIAGNOSIS — Z98891 History of uterine scar from previous surgery: Secondary | ICD-10-CM

## 2022-12-14 DIAGNOSIS — O329XX0 Maternal care for malpresentation of fetus, unspecified, not applicable or unspecified: Secondary | ICD-10-CM | POA: Insufficient documentation

## 2022-12-14 SURGERY — Surgical Case
Anesthesia: Spinal

## 2022-12-14 MED ORDER — TETANUS-DIPHTH-ACELL PERTUSSIS 5-2.5-18.5 LF-MCG/0.5 IM SUSY: 0.5000 mL | PREFILLED_SYRINGE | Freq: Once | INTRAMUSCULAR | Status: DC

## 2022-12-14 MED ORDER — PRENATAL MULTIVITAMIN CH
1.0000 | ORAL_TABLET | Freq: Every day | ORAL | Status: DC
  Administered 2022-12-15 – 2022-12-16 (×2): 1 via ORAL
  Filled 2022-12-14 (×2): qty 1

## 2022-12-14 MED ORDER — CEFAZOLIN SODIUM-DEXTROSE 2-4 GM/100ML-% IV SOLN
INTRAVENOUS | Status: AC
  Filled 2022-12-14: qty 100

## 2022-12-14 MED ORDER — ENOXAPARIN SODIUM 40 MG/0.4ML IJ SOSY
40.0000 mg | PREFILLED_SYRINGE | INTRAMUSCULAR | Status: DC
  Administered 2022-12-15 – 2022-12-16 (×2): 40 mg via SUBCUTANEOUS
  Filled 2022-12-14 (×2): qty 0.4

## 2022-12-14 MED ORDER — OXYTOCIN-SODIUM CHLORIDE 30-0.9 UT/500ML-% IV SOLN: 2.5000 [IU]/h | INTRAVENOUS | Status: AC

## 2022-12-14 MED ORDER — KETOROLAC TROMETHAMINE 30 MG/ML IJ SOLN
30.0000 mg | Freq: Four times a day (QID) | INTRAMUSCULAR | Status: AC | PRN
  Administered 2022-12-14: 30 mg via INTRAMUSCULAR

## 2022-12-14 MED ORDER — PHENYLEPHRINE HCL-NACL 20-0.9 MG/250ML-% IV SOLN
INTRAVENOUS | Status: DC | PRN
  Administered 2022-12-14: 60 ug/min via INTRAVENOUS

## 2022-12-14 MED ORDER — OXYTOCIN-SODIUM CHLORIDE 30-0.9 UT/500ML-% IV SOLN
INTRAVENOUS | Status: AC
  Filled 2022-12-14: qty 500

## 2022-12-14 MED ORDER — FENTANYL CITRATE (PF) 100 MCG/2ML IJ SOLN
INTRAMUSCULAR | Status: AC
  Filled 2022-12-14: qty 2

## 2022-12-14 MED ORDER — OXYCODONE HCL 5 MG PO TABS: 5.0000 mg | ORAL_TABLET | Freq: Once | ORAL | Status: DC | PRN

## 2022-12-14 MED ORDER — ONDANSETRON HCL 4 MG/2ML IJ SOLN
INTRAMUSCULAR | Status: AC
  Filled 2022-12-14: qty 2

## 2022-12-14 MED ORDER — MORPHINE SULFATE (PF) 0.5 MG/ML IJ SOLN
INTRAMUSCULAR | Status: DC | PRN
  Administered 2022-12-14: 150 ug via INTRATHECAL

## 2022-12-14 MED ORDER — BUPIVACAINE IN DEXTROSE 0.75-8.25 % IT SOLN
INTRATHECAL | Status: DC | PRN
  Administered 2022-12-14: 1.6 mL via INTRATHECAL

## 2022-12-14 MED ORDER — DIPHENHYDRAMINE HCL 25 MG PO CAPS: 25.0000 mg | ORAL_CAPSULE | Freq: Four times a day (QID) | ORAL | Status: DC | PRN

## 2022-12-14 MED ORDER — KETOROLAC TROMETHAMINE 30 MG/ML IJ SOLN
30.0000 mg | Freq: Four times a day (QID) | INTRAMUSCULAR | Status: AC | PRN
  Administered 2022-12-14: 30 mg via INTRAVENOUS

## 2022-12-14 MED ORDER — MORPHINE SULFATE (PF) 0.5 MG/ML IJ SOLN
INTRAMUSCULAR | Status: AC
  Filled 2022-12-14: qty 10

## 2022-12-14 MED ORDER — DEXAMETHASONE SODIUM PHOSPHATE 10 MG/ML IJ SOLN
INTRAMUSCULAR | Status: DC | PRN
  Administered 2022-12-14: 8 mg via INTRAVENOUS

## 2022-12-14 MED ORDER — SODIUM CHLORIDE 0.9 % IR SOLN
Status: DC | PRN
  Administered 2022-12-14: 1

## 2022-12-14 MED ORDER — SOD CITRATE-CITRIC ACID 500-334 MG/5ML PO SOLN
30.0000 mL | Freq: Once | ORAL | Status: AC
  Administered 2022-12-14: 30 mL via ORAL

## 2022-12-14 MED ORDER — MEPERIDINE HCL 25 MG/ML IJ SOLN: 6.2500 mg | INTRAMUSCULAR | Status: DC | PRN

## 2022-12-14 MED ORDER — LACTATED RINGERS IV SOLN: INTRAVENOUS | Status: DC

## 2022-12-14 MED ORDER — ACETAMINOPHEN 500 MG PO TABS
1000.0000 mg | ORAL_TABLET | Freq: Four times a day (QID) | ORAL | Status: DC
  Administered 2022-12-14 – 2022-12-16 (×8): 1000 mg via ORAL
  Filled 2022-12-14 (×8): qty 2

## 2022-12-14 MED ORDER — ACETAMINOPHEN 10 MG/ML IV SOLN
INTRAVENOUS | Status: AC
  Filled 2022-12-14: qty 100

## 2022-12-14 MED ORDER — SENNOSIDES-DOCUSATE SODIUM 8.6-50 MG PO TABS
2.0000 | ORAL_TABLET | Freq: Every day | ORAL | Status: DC
  Administered 2022-12-15 – 2022-12-16 (×2): 2 via ORAL
  Filled 2022-12-14 (×2): qty 2

## 2022-12-14 MED ORDER — PHENYLEPHRINE HCL-NACL 20-0.9 MG/250ML-% IV SOLN
INTRAVENOUS | Status: AC
  Filled 2022-12-14: qty 250

## 2022-12-14 MED ORDER — PHENYLEPHRINE 80 MCG/ML (10ML) SYRINGE FOR IV PUSH (FOR BLOOD PRESSURE SUPPORT)
PREFILLED_SYRINGE | INTRAVENOUS | Status: AC
  Filled 2022-12-14: qty 10

## 2022-12-14 MED ORDER — OXYCODONE HCL 5 MG/5ML PO SOLN: 5.0000 mg | Freq: Once | ORAL | Status: DC | PRN

## 2022-12-14 MED ORDER — SOD CITRATE-CITRIC ACID 500-334 MG/5ML PO SOLN
ORAL | Status: AC
  Filled 2022-12-14: qty 30

## 2022-12-14 MED ORDER — MENTHOL 3 MG MT LOZG: 1.0000 | LOZENGE | OROMUCOSAL | Status: DC | PRN

## 2022-12-14 MED ORDER — SIMETHICONE 80 MG PO CHEW
80.0000 mg | CHEWABLE_TABLET | ORAL | Status: DC | PRN
  Administered 2022-12-15: 80 mg via ORAL
  Filled 2022-12-14: qty 1

## 2022-12-14 MED ORDER — COCONUT OIL OIL
1.0000 | TOPICAL_OIL | Status: DC | PRN
  Administered 2022-12-15 – 2022-12-16 (×2): 1 via TOPICAL

## 2022-12-14 MED ORDER — IBUPROFEN 600 MG PO TABS
600.0000 mg | ORAL_TABLET | Freq: Four times a day (QID) | ORAL | Status: DC
  Administered 2022-12-15 – 2022-12-16 (×3): 600 mg via ORAL
  Filled 2022-12-14 (×3): qty 1

## 2022-12-14 MED ORDER — CHLORHEXIDINE GLUCONATE 0.12 % MT SOLN
OROMUCOSAL | Status: AC
  Filled 2022-12-14: qty 15

## 2022-12-14 MED ORDER — DEXAMETHASONE SODIUM PHOSPHATE 4 MG/ML IJ SOLN
INTRAMUSCULAR | Status: AC
  Filled 2022-12-14: qty 2

## 2022-12-14 MED ORDER — OXYCODONE HCL 5 MG PO TABS
5.0000 mg | ORAL_TABLET | ORAL | Status: DC | PRN
  Filled 2022-12-14: qty 1

## 2022-12-14 MED ORDER — FENTANYL CITRATE (PF) 100 MCG/2ML IJ SOLN
INTRAMUSCULAR | Status: DC | PRN
  Administered 2022-12-14: 15 ug via INTRATHECAL

## 2022-12-14 MED ORDER — SIMETHICONE 80 MG PO CHEW
80.0000 mg | CHEWABLE_TABLET | Freq: Three times a day (TID) | ORAL | Status: DC
  Administered 2022-12-14 – 2022-12-16 (×5): 80 mg via ORAL
  Filled 2022-12-14 (×5): qty 1

## 2022-12-14 MED ORDER — ACETAMINOPHEN 500 MG PO TABS: 1000.0000 mg | ORAL_TABLET | Freq: Once | ORAL | Status: DC

## 2022-12-14 MED ORDER — FAMOTIDINE 20 MG PO TABS
ORAL_TABLET | ORAL | Status: AC
  Filled 2022-12-14: qty 1

## 2022-12-14 MED ORDER — CHLORHEXIDINE GLUCONATE 0.12 % MT SOLN
15.0000 mL | Freq: Once | OROMUCOSAL | Status: AC
  Administered 2022-12-14: 15 mL via OROMUCOSAL

## 2022-12-14 MED ORDER — KETOROLAC TROMETHAMINE 30 MG/ML IJ SOLN
INTRAMUSCULAR | Status: AC
  Filled 2022-12-14: qty 1

## 2022-12-14 MED ORDER — STERILE WATER FOR IRRIGATION IR SOLN
Status: DC | PRN
  Administered 2022-12-14: 1000 mL

## 2022-12-14 MED ORDER — DEXMEDETOMIDINE HCL IN NACL 80 MCG/20ML IV SOLN
INTRAVENOUS | Status: DC | PRN
  Administered 2022-12-14: 8 ug via INTRAVENOUS

## 2022-12-14 MED ORDER — CEFAZOLIN SODIUM-DEXTROSE 2-4 GM/100ML-% IV SOLN
2.0000 g | Freq: Once | INTRAVENOUS | Status: AC
  Administered 2022-12-14: 2 g via INTRAVENOUS

## 2022-12-14 MED ORDER — ACETAMINOPHEN 160 MG/5ML PO SOLN: 960.0000 mg | Freq: Once | ORAL | Status: DC

## 2022-12-14 MED ORDER — FENTANYL CITRATE (PF) 100 MCG/2ML IJ SOLN: 25.0000 ug | INTRAMUSCULAR | Status: DC | PRN

## 2022-12-14 MED ORDER — FAMOTIDINE 20 MG PO TABS
20.0000 mg | ORAL_TABLET | Freq: Once | ORAL | Status: AC
  Administered 2022-12-14: 20 mg via ORAL

## 2022-12-14 MED ORDER — ACETAMINOPHEN 10 MG/ML IV SOLN
INTRAVENOUS | Status: DC | PRN
  Administered 2022-12-14: 1000 mg via INTRAVENOUS

## 2022-12-14 MED ORDER — OXYTOCIN-SODIUM CHLORIDE 30-0.9 UT/500ML-% IV SOLN
INTRAVENOUS | Status: DC | PRN
  Administered 2022-12-14: 300 mL via INTRAVENOUS

## 2022-12-14 MED ORDER — ONDANSETRON HCL 4 MG/2ML IJ SOLN
INTRAMUSCULAR | Status: DC | PRN
  Administered 2022-12-14: 4 mg via INTRAVENOUS

## 2022-12-14 MED ORDER — SCOPOLAMINE 1 MG/3DAYS TD PT72
MEDICATED_PATCH | TRANSDERMAL | Status: AC
  Filled 2022-12-14: qty 1

## 2022-12-14 MED ORDER — PROMETHAZINE HCL 25 MG/ML IJ SOLN: 6.2500 mg | INTRAMUSCULAR | Status: DC | PRN

## 2022-12-14 MED ORDER — SCOPOLAMINE 1 MG/3DAYS TD PT72
1.0000 | MEDICATED_PATCH | Freq: Once | TRANSDERMAL | Status: DC
  Administered 2022-12-14: 1.5 mg via TRANSDERMAL

## 2022-12-14 MED ORDER — KETOROLAC TROMETHAMINE 30 MG/ML IJ SOLN
30.0000 mg | Freq: Four times a day (QID) | INTRAMUSCULAR | Status: AC
  Administered 2022-12-15 (×3): 30 mg via INTRAVENOUS
  Filled 2022-12-14 (×4): qty 1

## 2022-12-14 MED ORDER — GABAPENTIN 100 MG PO CAPS
100.0000 mg | ORAL_CAPSULE | Freq: Two times a day (BID) | ORAL | Status: DC
  Administered 2022-12-14 – 2022-12-16 (×4): 100 mg via ORAL
  Filled 2022-12-14 (×5): qty 1

## 2022-12-14 MED ORDER — METOCLOPRAMIDE HCL 5 MG/ML IJ SOLN
INTRAMUSCULAR | Status: AC
  Filled 2022-12-14: qty 2

## 2022-12-14 MED ORDER — ORAL CARE MOUTH RINSE: 15.0000 mL | Freq: Once | OROMUCOSAL | Status: AC

## 2022-12-14 MED ORDER — ZOLPIDEM TARTRATE 5 MG PO TABS: 5.0000 mg | ORAL_TABLET | Freq: Every evening | ORAL | Status: DC | PRN

## 2022-12-14 SURGICAL SUPPLY — 30 items
ADH SKN CLS APL DERMABOND .7 (GAUZE/BANDAGES/DRESSINGS) ×1
APL PRP STRL LF DISP 70% ISPRP (MISCELLANEOUS) ×2
CHLORAPREP W/TINT 26 (MISCELLANEOUS) ×2 IMPLANT
CLAMP UMBILICAL CORD (MISCELLANEOUS) ×1 IMPLANT
CLOTH BEACON ORANGE TIMEOUT ST (SAFETY) ×1 IMPLANT
DERMABOND ADVANCED .7 DNX12 (GAUZE/BANDAGES/DRESSINGS) ×1 IMPLANT
DRSG OPSITE POSTOP 4X10 (GAUZE/BANDAGES/DRESSINGS) ×1 IMPLANT
ELECT REM PT RETURN 9FT ADLT (ELECTROSURGICAL) ×1
ELECTRODE REM PT RTRN 9FT ADLT (ELECTROSURGICAL) ×1 IMPLANT
EXTRACTOR VACUUM KIWI (MISCELLANEOUS) ×1 IMPLANT
GLOVE BIOGEL PI IND STRL 7.0 (GLOVE) ×3 IMPLANT
GLOVE ECLIPSE 6.5 STRL STRAW (GLOVE) ×1 IMPLANT
GOWN STRL REUS W/ TWL LRG LVL3 (GOWN DISPOSABLE) ×2 IMPLANT
GOWN STRL REUS W/TWL LRG LVL3 (GOWN DISPOSABLE) ×2
MAT PREVALON FULL STRYKER (MISCELLANEOUS) IMPLANT
NS IRRIG 1000ML POUR BTL (IV SOLUTION) ×1 IMPLANT
PAD OB MATERNITY 4.3X12.25 (PERSONAL CARE ITEMS) ×1 IMPLANT
PAD PREP 24X48 CUFFED NSTRL (MISCELLANEOUS) ×1 IMPLANT
RETRACTOR WND ALEXIS 25 LRG (MISCELLANEOUS) IMPLANT
RTRCTR WOUND ALEXIS 25CM LRG (MISCELLANEOUS)
SUT MNCRL AB 0 CT1 27 (SUTURE) ×1 IMPLANT
SUT PLAIN 2 0 XLH (SUTURE) ×1 IMPLANT
SUT VIC AB 0 CT1 36 (SUTURE) ×1 IMPLANT
SUT VIC AB 2-0 CT1 27 (SUTURE) ×1
SUT VIC AB 2-0 CT1 TAPERPNT 27 (SUTURE) ×1 IMPLANT
SUT VIC AB 4-0 KS 27 (SUTURE) ×1 IMPLANT
TOWEL OR 17X24 6PK STRL BLUE (TOWEL DISPOSABLE) ×3 IMPLANT
TRAY FOLEY W/BAG SLVR 16FR (SET/KITS/TRAYS/PACK) ×1
TRAY FOLEY W/BAG SLVR 16FR ST (SET/KITS/TRAYS/PACK) ×1 IMPLANT
WATER STERILE IRR 1000ML POUR (IV SOLUTION) ×1 IMPLANT

## 2022-12-14 NOTE — Anesthesia Procedure Notes (Signed)
Spinal  Patient location during procedure: OR Start time: 12/14/2022 11:52 AM End time: 12/14/2022 11:55 AM Reason for block: surgical anesthesia Staffing Performed: anesthesiologist  Anesthesiologist: Beryle Lathe, MD Performed by: Beryle Lathe, MD Authorized by: Beryle Lathe, MD   Preanesthetic Checklist Completed: patient identified, IV checked, risks and benefits discussed, surgical consent, monitors and equipment checked, pre-op evaluation and timeout performed Spinal Block Patient position: sitting Prep: DuraPrep Patient monitoring: heart rate, cardiac monitor, continuous pulse ox and blood pressure Approach: midline Location: L3-4 Injection technique: single-shot Needle Needle type: Pencan  Needle gauge: 24 G Additional Notes Consent was obtained prior to the procedure with all questions answered and concerns addressed. Risks including, but not limited to, bleeding, infection, nerve damage, paralysis, failed block, inadequate analgesia, allergic reaction, high spinal, itching, and headache were discussed and the patient wished to proceed. Functioning IV was confirmed and monitors were applied. Sterile prep and drape, including hand hygiene, mask, and sterile gloves were used. The patient was positioned and the spine was prepped. The skin was anesthetized with lidocaine. Free flow of clear CSF was obtained prior to injecting local anesthetic into the CSF. The spinal needle aspirated freely following injection. The needle was carefully withdrawn. The patient tolerated the procedure well.   Leslye Peer, MD

## 2022-12-14 NOTE — H&P (Signed)
Obstetric Preoperative History and Physical  Monique Pruitt is a 32 y.o. G1P0000 with IUP at [redacted]w[redacted]d presenting for presenting for scheduled cesarean section.  No acute concerns.   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   Prenatal labs and studies: ABO, Rh: --/--/O NEG (08/06 8657) Antibody: POS (08/06 0922) Rubella: 4.34 (01/22 1049) RPR: NON REACTIVE (08/06 0922)  HBsAg: Negative (01/22 1049)  HIV: NON REACTIVE (08/06 0922)  QIO:NGEXBMWU/-- (07/15 0833) 2 hr Glucola  WNL Genetic screening Declined Anatomy US normal  Prenatal Transfer Tool  Maternal Diabetes: No Genetic Screening: Declined Maternal Ultrasounds/Referrals: Normal-- choroid plexus cyst Fetal Ultrasounds or other Referrals:  None Maternal Substance Abuse:  No Significant Maternal Medications:  None Significant Maternal Lab Results: Group B Strep negative  Past Medical History:  Diagnosis Date   Depression    Genital HSV    Migraine without aura 03/15/2021    Past Surgical History:  Procedure Laterality Date   WISDOM TOOTH EXTRACTION      OB History  Gravida Para Term Preterm AB Living  1 0 0 0 0 0  SAB IAB Ectopic Multiple Live Births  0 0 0 0 0    # Outcome Date GA Lbr Len/2nd Weight Sex Type Anes PTL Lv  1 Current             Social History   Socioeconomic History   Marital status: Married    Spouse name: Not on file   Number of children: Not on file   Years of education: Not on file   Highest education level: Not on file  Occupational History   Not on file  Tobacco Use   Smoking status: Never    Smokeless tobacco: Never  Vaping Use   Vaping status: Never Used  Substance and Sexual Activity   Alcohol use: Not Currently    Comment: occ   Drug use: Never   Sexual activity: Yes    Birth control/protection: None  Other Topics Concern   Not on file  Social History Narrative   Not on file   Social Determinants of Health   Financial Resource Strain: Not on file  Food Insecurity: No Food Insecurity (12/14/2022)   Hunger Vital Sign    Worried About Running Out of Food in the Last Year: Never true    Ran Out of Food in the Last Year: Never true  Transportation Needs: No Transportation Needs (12/14/2022)   PRAPARE - Administrator, Civil Service (Medical): No    Lack of Transportation (Non-Medical): No  Physical Activity: Not on file  Stress: Not on file  Social Connections: Unknown (09/13/2021)   [facility] from [facility]   Social Network    Social Network: Not on file    Family History  Problem Relation Age of Onset   Endometriosis Mother    Ovarian cancer Maternal Grandmother     Medications Prior to Admission  Medication Sig Dispense Refill Last Dose   calcium carbonate (TUMS - DOSED IN MG ELEMENTAL CALCIUM) 500 MG chewable tablet Chew 1-2 tablets by mouth 3 (three) times  daily as needed for indigestion or heartburn.      Prenatal Vit-Fe Fumarate-FA (PRENATAL VITAMIN PO) Take 1 tablet by mouth in the morning.      valACYclovir (VALTREX) 1000 MG tablet Take 1 Gm BID for 7 days then 1 Gm daily for remainder of pregnancy. (Patient taking differently: Take 1,000 mg by mouth in the morning.) 90 tablet 1     No Known Allergies  Review of Systems: Negative except for what is mentioned in HPI.  Physical Exam: BP 102/84   Pulse 63   Temp 98.3 F (36.8 C) (Oral)   Resp 18   Ht 5\' 4"  (1.626 m)   Wt 77.6 kg   LMP 03/15/2022   SpO2 98%   BMI 29.37 kg/m  FHR by Doppler: 155 bpm CONSTITUTIONAL: Well-developed, well-nourished female in no acute distress.  HENT:   Normocephalic, atraumatic, External right and left ear normal. Oropharynx is clear and moist EYES: Conjunctivae and EOM are normal. Pupils are equal, round, and reactive to light. No scleral icterus.  NECK: Normal range of motion, supple, no masses SKIN: Skin is warm and dry. No rash noted. Not diaphoretic. No erythema. No pallor. NEUROLGIC: Alert and oriented to person, place, and time. Normal reflexes, muscle tone coordination. No cranial nerve deficit noted. PSYCHIATRIC: Normal mood and affect. Normal behavior. Normal judgment and thought content. CARDIOVASCULAR: Normal heart rate noted, regular rhythm RESPIRATORY: Effort and breath sounds normal, no problems with respiration noted ABDOMEN: Soft, nontender, nondistended, gravid.  PELVIC: Deferred MUSCULOSKELETAL: Normal range of motion. No edema and no tenderness. 2+ distal pulses.   Pertinent Labs/Studies:   Results for orders placed or performed during the hospital encounter of 12/12/22 (from the past 72 hour(s))  CBC     Status: None   Collection Time: 12/12/22  9:22 AM  Result Value Ref Range   WBC 7.3 4.0 - 10.5 K/uL   RBC 4.04 3.87 - 5.11 MIL/uL   Hemoglobin 12.1 12.0 - 15.0 g/dL   HCT 91.4 78.2 - 95.6 %   MCV 90.1 80.0 - 100.0 fL   MCH 30.0 26.0 - 34.0 pg   MCHC 33.2 30.0 - 36.0 g/dL   RDW 21.3 08.6 - 57.8 %   Platelets 150 150 - 400 K/uL   nRBC 0.0 0.0 - 0.2 %    Comment: Performed at [facility], [address] [place], [place] [address]  RPR     Status: None   Collection Time: 12/12/22  9:22 AM  Result Value Ref Range   RPR Ser Ql NON REACTIVE NON REACTIVE    Comment: Performed at [facility], [address] [place], [place] [address]  Rapid HIV screen (HIV 1/2 Ab+Ag)     Status: None   Collection Time: 12/12/22  9:22 AM  Result Value Ref Range   HIV-1 P24 Antigen - HIV24 NON REACTIVE NON REACTIVE    Comment: (NOTE) Detection of p24 may be inhibited by biotin in the sample, causing false negative  results in acute infection.    HIV 1/2 Antibodies NON REACTIVE NON REACTIVE   Interpretation (HIV Ag Ab)      A non reactive test result means that HIV 1 or HIV 2 antibodies and HIV 1 p24 antigen were not detected in the specimen.    Comment: Performed at [facility], [address], [place], [place] [address]  Type and screen     Status: None   Collection Time: 12/12/22  9:22  AM  Result Value Ref Range   ABO/RH(D) O NEG    Antibody Screen POS    Sample Expiration 12/15/2022,2359    Antibody Identification      PASSIVELY ACQUIRED ANTI-D Performed at [facility], [address] [place], [place] [address]     Assessment and Plan :Monique Pruitt is a 32 y.o. G1P0000 at [redacted]w[redacted]d being admitted being admitted for scheduled cesarean section. The risks of cesarean section discussed with the patient included but were not limited to: bleeding which may require transfusion or reoperation; infection which may require antibiotics; injury to bowel, bladder, ureters or other surrounding organs; injury to the fetus; need for additional procedures including hysterectomy in the event of a life-threatening hemorrhage; placental abnormalities wth subsequent pregnancies, incisional problems, thromboembolic phenomenon and other postoperative/anesthesia complications. The patient concurred with the proposed plan, giving informed written consent for the procedure. Patient has been NPO since last night she will remain NPO for procedure. Anesthesia and OR aware. Preoperative prophylactic antibiotics and SCDs ordered on call to the OR. To OR when ready.    Federico Flake, MD, MPH, ABFM Attending Physician Center for [facility]

## 2022-12-14 NOTE — Op Note (Signed)
Monique Pruitt PROCEDURE DATE: 12/14/2022  PREOPERATIVE DIAGNOSES: Intrauterine pregnancy at [redacted]w[redacted]d weeks gestation; malpresentation: breech s/p failed ECV on 7/24  POSTOPERATIVE DIAGNOSES: The same  PROCEDURE: Low Transverse Cesarean Section  SURGEON:  Dr. Alvester Morin  ASSISTANT:  Dr. Salvadore Dom, Dr. Lucianne Muss. An experienced assistant was required given the standard of surgical care given the complexity of the case.  This assistant was needed for exposure, dissection, suctioning, retraction, instrument exchange, assisting with delivery with administration of fundal pressure, and for overall help during the procedure.  ANESTHESIOLOGY TEAM: Anesthesiologist: Beryle Lathe, MD  INDICATIONS: Monique Pruitt is a 32 y.o. G1P1001 at [redacted]w[redacted]d here for cesarean section secondary to the indications listed under preoperative diagnoses; please see preoperative note for further details.  The risks of surgery were discussed with the patient including but were not limited to: bleeding which may require transfusion or reoperation; infection which may require antibiotics; injury to bowel, bladder, ureters or other surrounding organs; injury to the fetus; need for additional procedures including hysterectomy in the event of a life-threatening hemorrhage; formation of adhesions; placental abnormalities wth subsequent pregnancies; incisional problems; thromboembolic phenomenon and other postoperative/anesthesia complications.  The patient concurred with the proposed plan, giving informed written consent for the procedure.    FINDINGS:  Viable female infant in cephalic presentation.  Apgars 8 and 9.  Clear amniotic fluid.  Intact placenta, three vessel cord.  Normal uterus, fallopian tubes and ovaries bilaterally.  ANESTHESIA: Spinal INTRAVENOUS FLUIDS: 2000 ml   ESTIMATED BLOOD LOSS: 269 ml URINE OUTPUT:  250 ml SPECIMENS: Placenta sent to L&D COMPLICATIONS: None immediate  PROCEDURE IN DETAIL:  The  patient preoperatively received intravenous antibiotics and had sequential compression devices applied to her lower extremities.  She was then taken to the operating room where spinal anesthesia was administered and was found to be adequate. She was then placed in a dorsal supine position with a leftward tilt, and prepped and draped in a sterile manner.  A foley catheter was placed into her bladder and attached to constant gravity.  After an adequate timeout was performed, a Pfannenstiel skin incision was made with scalpel and carried through to the underlying layer of fascia. The fascia was incised in the midline, and this incision was extended bilaterally in a blunt fashion.  The underlying rectus muscles were dissected off the fascia superiorly and inferiorly in a blunt fashion. The rectus muscles were separated in the midline and the peritoneum was entered bluntly. The Alexis self-retaining retractor was introduced into the abdominal cavity.  Attention was turned to the lower uterine segment where a low transverse hysterotomy was made with a scalpel and extended bilaterally bluntly.  The infant was successfully delivered, the cord was clamped and cut after one minute, and the infant was handed over to the awaiting neonatology team. Uterine massage was then administered, and the placenta delivered intact with a three-vessel cord. The uterus was then cleared of clots and debris.  The hysterotomy was closed with 0 monocryl in a running locked fashion. The pelvis was cleared of all clot and debris. Hemostasis was confirmed on all surfaces.  The retractor was removed.  The peritoneum was closed with a 0 Vicryl running stitch. The fascia was then closed using 0 Vicryl in a running fashion.  The subcutaneous layer was irrigated, reapproximated with 2-0 plain gut interrupted stitches, and the skin was closed with a 4-0 Vicryl subcuticular stitch. The patient tolerated the procedure well. Sponge, instrument and needle  counts were correct x  3.  She was taken to the recovery room in stable condition.   Monique Jumbo, DO OB Fellow, Faculty [facility], Center for [facility] 12/14/2022, 1:07 PM

## 2022-12-14 NOTE — Discharge Summary (Signed)
Postpartum Discharge Summary    Patient Name: Monique Pruitt DOB: 06/07/1990 MRN: [id number]  Date of admission: 12/14/2022 Delivery date:12/14/2022 Delivering provider: Federico Flake Date of discharge: 12/16/2022  Admitting diagnosis: S/P primary low transverse C-section [Z98.891] Intrauterine pregnancy: [redacted]w[redacted]d     Secondary diagnosis:  Principal Problem:   S/P primary low transverse C-section Active Problems:   History of HSV   Rh negative state in antepartum period   Choroid plexus cyst   Malpresentation before onset of labor  Additional problems: n/a    Discharge diagnosis: Term Pregnancy Delivered                                              Post partum procedures:rhogam Augmentation: N/A Complications: None  Hospital course: Sceduled C/S   32 y.o. yo G1P1001 at [redacted]w[redacted]d was admitted to the hospital 12/14/2022 for scheduled cesarean section with the following indication:Malpresentation.Delivery details are as follows:  Membrane Rupture Time/Date: 12:18 PM,12/14/2022  Delivery Method:C-Section, Low Transverse Operative Delivery:N/A Details of operation can be found in separate operative note.  Patient had a postpartum course complicated by anemia. She received a venofer infusion.  She is ambulating, tolerating a regular diet, passing flatus, and urinating well. Patient is discharged home in stable condition on  12/16/22        Newborn Data: Birth date:12/14/2022 Birth time:12:21 PM Gender:Female Living status:Living Apgars:8 ,9  Weight:3420 g    Magnesium Sulfate received: No BMZ received: No Rhophylac:Yes MMR:No T-DaP:Given prenatally Flu: No Transfusion:Yes, venofer x1  Physical exam  Vitals:   12/15/22 1610 12/15/22 2012 12/16/22 0510 12/16/22 1449  BP: 101/71 106/72 102/75 115/80  Pulse: 82 78 70 79  Resp: 17  18   Temp: 98.5 F (36.9 C) 98.3 F (36.8 C) 98.4 F (36.9 C) 98.1 F (36.7 C)  TempSrc: Axillary Oral Oral Oral  SpO2: 98% 99%     Weight:      Height:       General: alert, cooperative, and no distress Lochia: appropriate Uterine Fundus: firm Incision: Dressing is clean, dry, and intact DVT Evaluation: No evidence of DVT seen on physical exam. Negative Homan's sign. No cords or calf tenderness. No significant calf/ankle edema. Labs: Lab Results  Component Value Date   WBC 12.3 (H) 12/15/2022   HGB 9.6 (L) 12/15/2022   HCT 28.8 (L) 12/15/2022   MCV 89.4 12/15/2022   PLT 144 (L) 12/15/2022       No data to display         Edinburgh Score:    12/14/2022    3:25 PM  Edinburgh Postnatal Depression Scale Screening Tool  I have been able to laugh and see the funny side of things. 0  I have looked forward with enjoyment to things. 0  I have blamed myself unnecessarily when things went wrong. 1  I have been anxious or worried for no good reason. 1  I have felt scared or panicky for no good reason. 1  Things have been getting on top of me. 1  I have been so unhappy that I have had difficulty sleeping. 0  I have felt sad or miserable. 0  I have been so unhappy that I have been crying. 1  The thought of harming myself has occurred to me. 0  Edinburgh Postnatal Depression Scale Total 5  After visit meds:  Allergies as of 12/16/2022   No Known Allergies      Medication List     STOP taking these medications    calcium carbonate 500 MG chewable tablet Commonly known as: TUMS - dosed in mg elemental calcium   valACYclovir 1000 MG tablet Commonly known as: Valtrex       TAKE these medications    acetaminophen 500 MG tablet Commonly known as: TYLENOL Take 2 tablets (1,000 mg total) by mouth every 6 (six) hours.   ferrous sulfate 325 (65 FE) MG tablet Take 1 tablet (325 mg total) by mouth daily with breakfast.   ibuprofen 600 MG tablet Commonly known as: ADVIL Take 1 tablet (600 mg total) by mouth every 6 (six) hours.   oxyCODONE 5 MG immediate release tablet Commonly known as: Oxy  IR/ROXICODONE Take 1 tablet (5 mg total) by mouth every 4 (four) hours as needed for moderate pain.   PRENATAL VITAMIN PO Take 1 tablet by mouth in the morning.   Slynd 4 MG Tabs Generic drug: Drospirenone Take 1 tablet (4 mg total) by mouth daily.         Discharge home in stable condition Infant Feeding: Breast Infant Disposition:home with mother Discharge instruction: per After Visit Summary and Postpartum booklet. Activity: Advance as tolerated. Pelvic rest for 6 weeks.  Diet: routine diet Future Appointments: Future Appointments  Date Time Provider Department Center  12/21/2022 10:00 AM [facility] NURSE [facility]  01/25/2023 10:30 AM Milas Hock, MD [facility]   Follow up Visit:  Message sent to [facility] by Autry-Lott on 12/16/2022  Please schedule this patient for a In person postpartum visit in 6 weeks with the following provider: Any provider. Additional Postpartum F/U: Rh neg, ensure she received rhogam   Low risk pregnancy complicated by:  breech presentation Delivery mode:  C-Section, Low Transverse Anticipated Birth Control:   Non-hormonal, not IUD   12/16/2022 Randa Evens Autry-Lott, DO

## 2022-12-14 NOTE — Transfer of Care (Signed)
Immediate Anesthesia Transfer of Care Note  Patient: Monique Pruitt  Procedure(s) Performed: CESAREAN SECTION  Patient Location: PACU  Anesthesia Type:Spinal  Level of Consciousness: awake, alert , and oriented  Airway & Oxygen Therapy: Patient Spontanous Breathing  Post-op Assessment: Report given to RN and Post -op Vital signs reviewed and stable  Post vital signs: Reviewed and stable  Last Vitals:  Vitals Value Taken Time  BP 111/71 12/14/22 1312  Temp 36.6 C 12/14/22 1312  Pulse 76 12/14/22 1313  Resp 19 12/14/22 1313  SpO2 93 % 12/14/22 1313  Vitals shown include unfiled device data.  Last Pain:  Vitals:   12/14/22 1312  TempSrc: Oral         Complications: No notable events documented.

## 2022-12-14 NOTE — Anesthesia Preprocedure Evaluation (Addendum)
Anesthesia Evaluation  Patient identified by MRN, date of birth, ID band Patient awake    Reviewed: Allergy & Precautions, NPO status , Patient's Chart, lab work & pertinent test results  History of Anesthesia Complications Negative for: history of anesthetic complications  Airway Mallampati: II  TM Distance: >3 FB Neck ROM: Full    Dental  (+) Dental Advisory Given, Teeth Intact   Pulmonary neg pulmonary ROS   Pulmonary exam normal        Cardiovascular negative cardio ROS Normal cardiovascular exam     Neuro/Psych  Headaches PSYCHIATRIC DISORDERS  Depression       GI/Hepatic negative GI ROS, Neg liver ROS,,,  Endo/Other  negative endocrine ROS    Renal/GU negative Renal ROS     Musculoskeletal negative musculoskeletal ROS (+)    Abdominal   Peds  Hematology negative hematology ROS (+)  Plt 150k    Anesthesia Other Findings HSV  Reproductive/Obstetrics (+) Pregnancy                             Anesthesia Physical Anesthesia Plan  ASA: 2  Anesthesia Plan: Spinal   Post-op Pain Management: Tylenol PO (pre-op)*   Induction:   PONV Risk Score and Plan: 2 and Treatment may vary due to age or medical condition, Ondansetron and Scopolamine patch - Pre-op  Airway Management Planned: Natural Airway  Additional Equipment: None  Intra-op Plan:   Post-operative Plan:   Informed Consent: I have reviewed the patients History and Physical, chart, labs and discussed the procedure including the risks, benefits and alternatives for the proposed anesthesia with the patient or authorized representative who has indicated his/her understanding and acceptance.       Plan Discussed with: CRNA and Anesthesiologist  Anesthesia Plan Comments: (Labs reviewed, platelets acceptable. Discussed risks and benefits of spinal, including spinal/epidural hematoma, infection, failed block, and PDPH.  Patient expressed understanding and wished to proceed. )       Anesthesia Quick Evaluation

## 2022-12-14 NOTE — Lactation Note (Signed)
This note was copied from a baby's chart. Lactation Consultation Note  Patient Name: Monique Pruitt [id number] Date: 12/14/2022 Age:30 hours  Reason for consult: Initial assessment;Term;Difficult latch;Mother's request;Breastfeeding assistance;1st time breastfeeding (Birth Parent given 20 mm NS due to nipple stripes, abraisons and bleeding nipples.) P1, term female infant, Birth Parent having sore, cracked and bleeding nipples. LC fitted Birth Parent with 20 mm NS, worked on infant obtaining deep latch with pillow support, Birth Parent latched infant on her right breast using the football hold,infant was still breastfeeding after 18 minutes when LC left the room. LC gave Birth Parent shells to help evert nipple shaft out more and to help with healing, Birth Parent knows to wear shells in bra and when not sleeping. LC reviewed how to use DEBP and set on low setting ( 3 decimals ) due to Birth Parent's nipple strips and bruises.LC discussed infant's input and output and infant had one void since birth. LC discussed the importance of maternal rest, diet and hydration. Birth Parent was  made aware of O/P services, breastfeeding support groups, community resources, and our phone # for post-discharge questions.    Current feeding plan: 1- Birth Parent will apply 20 mm NS, prior to latch, continue to BF infant according to hunger cues, on demand, 8 to 12+ times within 24 hours, skin to skin. 2- Continue to ask RN/LC for latch assistance or help with applying 20 mm NS. 3- Birth Parent will use DEBP every 3 hours for 15 minutes and give infant back any colostrum that is express from pumping.   Maternal Data Has patient been taught Hand Expression?: Yes Does the patient have breastfeeding experience prior to this delivery?: No  Feeding Mother's Current Feeding Choice: Breast Milk  LATCH Score Latch: Grasps breast easily, tongue down, lips flanged, rhythmical sucking.  Audible Swallowing:  Spontaneous and intermittent  Type of Nipple: Flat  Filling, red/small blister or bruises   Hold (Positioning): Assistance needed to correctly position infant at breast and maintain latch.  Latch Score: 7   Lactation Tools Discussed/Used Tools: Flanges;Pump;Shells Flange Size: 21 Breast pump type: Double-Electric Breast Pump Pump Education: Setup, frequency, and cleaning;Milk Storage Reason for Pumping: Birth Parent using NS tonight Pumping frequency: Birth Parent will pump every 3 hours for 15 miutes at low setting. Birth Parent feels 3 decimals is comfortable with pumping.  Interventions Interventions: Breast feeding basics reviewed;Assisted with latch;Skin to skin;Support pillows;Position options;Adjust position;Breast compression;Education;Breast massage;Hand express;LC Services brochure  Discharge Pump: DEBP  Consult Status Consult Status: Follow-up Date: 12/15/22 Follow-up type: In-patient    Frederico Hamman 12/14/2022, 9:20 PM

## 2022-12-15 ENCOUNTER — Encounter (HOSPITAL_COMMUNITY): Payer: Self-pay | Admitting: Family Medicine

## 2022-12-15 NOTE — Progress Notes (Signed)
MOB was referred for history of depression.  * Referral screened out by Clinical Social Worker because none of the following criteria appear to apply:  ~ History of depression during this pregnancy, or of post-partum depression following prior delivery.  ~ Diagnosis of depression within last 3 years  Per OB notes, MOB did not indicate any signs/symptoms during pregnancy.  OR  * MOB's symptoms currently being treated with medication and/or therapy.  Please contact the Clinical Social Worker if needs arise, by MOB request, or if MOB scores greater than 9/yes to question 10 on Edinburgh Postpartum Depression Screen.  Ashley Jones, LCSWA Clinical Social Worker [phone] 

## 2022-12-15 NOTE — Lactation Note (Signed)
This note was copied from a baby's chart. Lactation Consultation Note Mom has painful latches. Mom using NS. Nipples cracked bleeding earlier. Mom stated that she can't tolerate pumping at this time d/t pain. Suggested mom let breast rest from pumping for now. Gave mom #18 flanges. LC will come for next feeding. Patient Name: Monique Pruitt [id number] Date: 12/15/2022 Age:30 hours Reason for consult: RN request;Nipple pain/trauma;Primapara;Term   Maternal Data    Feeding    LATCH Score    Audible Swallowing: A few with stimulation  Type of Nipple: Flat  Comfort (Breast/Nipple): Engorged, cracked, bleeding, large blisters, severe discomfort (positional stripes,bruising, mom stated they bleeding today)  Hold (Positioning): Assistance needed to correctly position infant at breast and maintain latch.      Lactation Tools Discussed/Used Tools: Flanges Flange Size: 18  Interventions Interventions: Coconut oil;Shells  Discharge    Consult Status Consult Status: Follow-up Date: 12/15/22 Follow-up type: In-patient    Charyl Dancer 12/15/2022, 10:29 PM

## 2022-12-15 NOTE — Anesthesia Postprocedure Evaluation (Signed)
Anesthesia Post Note  Patient: Monique Pruitt  Procedure(s) Performed: CESAREAN SECTION     Patient location during evaluation: PACU Anesthesia Type: Spinal Level of consciousness: awake and alert Pain management: pain level controlled Vital Signs Assessment: post-procedure vital signs reviewed and stable Respiratory status: spontaneous breathing and respiratory function stable Cardiovascular status: blood pressure returned to baseline and stable Postop Assessment: spinal receding and no apparent nausea or vomiting Anesthetic complications: no   No notable events documented.  Last Vitals:  Vitals:   12/14/22 2314 12/15/22 0426  BP: 97/66 94/65  Pulse: 62 62  Resp: 16 17  Temp: 37.1 C 37.1 C  SpO2:      Last Pain:  Vitals:   12/15/22 1223  TempSrc:   PainSc: 0-No pain                 Beryle Lathe

## 2022-12-16 MED ORDER — ACETAMINOPHEN 500 MG PO TABS: 1000.0000 mg | ORAL_TABLET | Freq: Four times a day (QID) | ORAL | 0 refills | Status: DC

## 2022-12-16 MED ORDER — FERROUS SULFATE 325 (65 FE) MG PO TABS: 325.0000 mg | ORAL_TABLET | Freq: Every day | ORAL | 0 refills | Status: DC

## 2022-12-16 MED ORDER — FERROUS SULFATE 325 (65 FE) MG PO TABS
325.0000 mg | ORAL_TABLET | ORAL | Status: DC
  Administered 2022-12-16: 325 mg via ORAL
  Filled 2022-12-16: qty 1

## 2022-12-16 MED ORDER — IBUPROFEN 600 MG PO TABS: 600.0000 mg | ORAL_TABLET | Freq: Four times a day (QID) | ORAL | 0 refills | Status: DC

## 2022-12-16 MED ORDER — SLYND 4 MG PO TABS: 1.0000 | ORAL_TABLET | Freq: Every day | ORAL | 0 refills | Status: DC

## 2022-12-16 MED ORDER — OXYCODONE HCL 5 MG PO TABS: 5.0000 mg | ORAL_TABLET | ORAL | 0 refills | Status: DC | PRN

## 2022-12-16 NOTE — Lactation Note (Signed)
This note was copied from a baby's chart. Lactation Consultation Note LC wanted to see a feed. Mom had baby on the breast when LC got in there. Mom stated dad assisted in latching. Mom had a great latch. It was painful though but not excruciating as it has been.  LC heard swallows and saw baby swallowing. When baby stopped feeding she was relaxed. LC placed baby in basinet. Noted colostrum pooled in mouth and colostrum ran out of NS when removed. Praised mom. Discussed cluster feeding. Mom is unable to tolerate hand expression or pumping at this time and isn't able to tolerate baby wanting to be on the breast a lot d/t pain. Talked w/mom about supplementing. Mom stated she will probably do that some if baby needs to fed more than 3 hrs. Praised mom for all of her colostrum and encouraged her if her breast are feeling full to BF, pump or something to relieve the fullness. Encouraged mom to assess breast before and after latching for milk transfer. Mom wearing silveretts earlier. Encouraged not to keep nipples in wet moist environment in dark silveretts. Mom had on shells. Encouraged not to sleep in shells. LC understands nipples are to tender for nipples to touch any kind of material. Suggested wear silveretts w/after drying from colostrum apply thin coat of coconut oil and then silveretts, wear shell during the day. Suggested show MD her nipples in am. Praised mom for all she is doing.  Patient Name: Monique Pruitt [id number] Date: 12/16/2022 Age:77 hours Reason for consult: Follow-up assessment;Primapara;Term   Maternal Data    Feeding    LATCH Score Latch: Grasps breast easily, tongue down, lips flanged, rhythmical sucking.  Audible Swallowing: Spontaneous and intermittent  Type of Nipple: Flat  Comfort (Breast/Nipple): Engorged, cracked, bleeding, large blisters, severe discomfort  Hold (Positioning): No assistance needed to correctly position infant at breast.  LATCH Score:  7   Lactation Tools Discussed/Used Tools: Flanges Flange Size: 18  Interventions Interventions: Breast feeding basics reviewed;Breast compression  Discharge    Consult Status Consult Status: Follow-up Date: 12/16/22 Follow-up type: In-patient    [place], [name] 12/16/2022, 12:00 AM

## 2022-12-16 NOTE — Lactation Note (Signed)
This note was copied from a baby's chart. Lactation Consultation Note  Patient Name: Monique Pruitt [id number] Date: 12/16/2022 Age:30 hours Reason for consult: Follow-up assessment;Primapara;Term;1st time breastfeeding;Infant weight loss (8 % weight loss , As LC entered the room mom and dad eating lunch and family member holding baby. LC encouraged to call on the nurses light when ready to feed .)   Maternal Data    Feeding Mother's Current Feeding Choice: Breast Milk  Discharge    Consult Status Consult Status: Follow-up Date: 12/16/22 Follow-up type: In-patient    Matilde Sprang Haydan Mansouri 12/16/2022, 1:20 PM

## 2022-12-16 NOTE — Progress Notes (Signed)
POSTPARTUM PROGRESS NOTE  POD #2  Subjective:  Monique Pruitt is a 32 y.o. G1P1001 s/p pLTCS at [redacted]w[redacted]d. No acute events overnight. She reports she is doing well. She denies any problems with ambulating, voiding or po intake. Denies nausea or vomiting. She has passed flatus. Pain is well controlled.  Lochia is adequate. She would like to speak to lactation consulting regarding her breast pain during feeding.   Objective: Blood pressure 102/75, pulse 70, temperature 98.4 F (36.9 C), temperature source Oral, resp. rate 18, height 5\' 4"  (1.626 m), weight 77.6 kg, last menstrual period 03/15/2022, SpO2 99%, unknown if currently breastfeeding.  Physical Exam:  General: alert, cooperative and no distress Chest: no respiratory distress Heart: regular rate, distal pulses intact Uterine Fundus: firm, appropriately tender DVT Evaluation: No calf swelling or tenderness Extremities: no edema Skin: warm, dry; incision clean/dry/intact w/ pressure bandage and honeycomb dressing in place  Recent Labs    12/15/22 0505  HGB 9.6*  HCT 28.8*    Assessment/Plan: Juda Defreitas is a 32 y.o. G1P1001 s/p pLTCS at [redacted]w[redacted]d for breech presentation.  POD#2 - Doing welll; pain well controlled. H/H appropriate  Routine postpartum care  OOB, ambulated  Lovenox for VTE prophylaxis Anemia: asymptomatic  Start po ferrous sulfate every other day Contraception: POPs Feeding: breast  Dispo: Plan for discharge today vs tomorrow.   LOS: 2 days   Myrtie Hawk, DO OB Fellow  12/16/2022, 10:37 AM

## 2022-12-16 NOTE — Lactation Note (Signed)
This note was copied from a baby's chart. Lactation Consultation Note  Patient Name: Monique Pruitt [id number] Date: 12/16/2022 Age:30 hours, 2nd LC visit  Reason for consult: Follow-up assessment;Primapara;1st time breastfeeding;Infant weight loss;Term;Nipple pain/trauma (8 % weight loss) Baby ready to feed with the 2nd LC visit.  LC assessed breast tissue and both nipples have abrasions, left > right.  LC recommended giving the left nipple a break from latching due to soreness. Both breast are fulling , but not engorged.  LC walked mom through the steps for latching and reviewed the application of the #20 NS . Baby latched easily on the right breast , football and accommodated the #20 NS well. Baby fed 17 mins with multiple swallows and milk noted in the NS after baby released. Dad pace fed baby 30 ml with the white nipple and baby tolerated well.  LC reviewed the hand pump and the DEBP , #21 F for today due to soreness and recommended on holding off from using the #18 F until the soreness is completely cleared up.  LC provided a LC plan for sore nipples , steps for latching and engorgement prevention and tx .  LC recommended and offered to request and LC O/appt and parents receptive.  Written LC plan provided.  Maternal Data Has patient been taught Hand Expression?: Yes Does the patient have breastfeeding experience prior to this delivery?: No  Feeding Mother's Current Feeding Choice: Breast Milk and Formula Nipple Type: Nfant Standard Flow (white)  LATCH Score Latch: Grasps breast easily, tongue down, lips flanged, rhythmical sucking.  Audible Swallowing: Spontaneous and intermittent  Type of Nipple: Everted at rest and after stimulation  Comfort (Breast/Nipple): Filling, red/small blisters or bruises, mild/mod discomfort  Hold (Positioning): Assistance needed to correctly position infant at breast and maintain latch.  LATCH Score: 8   Lactation Tools  Discussed/Used Tools: Pump;Shells;Flanges;Coconut oil;Nipple Shields Nipple shield size: 20 Flange Size: 21;18 Breast pump type: Double-Electric Breast Pump;Manual Reason for Pumping: due to soreness and weight loss  Interventions Interventions: Breast feeding basics reviewed;Assisted with latch;Skin to skin;Breast massage;Hand express;Pre-pump if needed;Reverse pressure;Breast compression;Adjust position;Support pillows;Position options;Shells;Comfort gels;Hand pump;DEBP;Education;Pace feeding;LC Services brochure  Discharge Discharge Education: Engorgement and breast care;Warning signs for feeding baby;Outpatient recommendation;Outpatient Epic message sent;Other (comment) Pump: DEBP;Manual;Hands Free;Personal  Consult Status Consult Status: Complete Date: 12/16/22 Follow-up type: In-patient    Monique Pruitt 12/16/2022, 3:40 PM

## 2022-12-18 ENCOUNTER — Encounter: Payer: Self-pay | Admitting: Obstetrics and Gynecology

## 2022-12-18 ENCOUNTER — Ambulatory Visit (INDEPENDENT_AMBULATORY_CARE_PROVIDER_SITE_OTHER): Payer: Commercial Managed Care - PPO | Admitting: Obstetrics and Gynecology

## 2022-12-18 VITALS — BP 126/86 | HR 86 | Ht 64.0 in | Wt 160.0 lb

## 2022-12-18 DIAGNOSIS — R519 Headache, unspecified: Secondary | ICD-10-CM | POA: Diagnosis not present

## 2022-12-18 DIAGNOSIS — Z789 Other specified health status: Secondary | ICD-10-CM | POA: Diagnosis not present

## 2022-12-18 NOTE — Progress Notes (Signed)
GYNECOLOGY OFFICE VISIT NOTE  History:   Monique Pruitt is a 32 y.o. G1P1001 here today for headaches after delivery. Worse when she sits up. Better when she lays down. Was okay after delivery but present and then worsened once she went home. Per her pediatrician, she tried excedrin migraine and it helped but then headache returned. Sometimes 8/10.   Otherwise we reviewed lactation. Her mood is good. She has good support from her mom who is with her.   She has no issues with her incision and her pain is well controlled.     Past Medical History:  Diagnosis Date   Depression    Genital HSV    Migraine without aura 03/15/2021    Past Surgical History:  Procedure Laterality Date   CESAREAN SECTION N/A 12/14/2022   Procedure: CESAREAN SECTION;  Surgeon: Federico Flake, MD;  Location: [facility] ORS;  Service: Obstetrics;  Laterality: N/A;   WISDOM TOOTH EXTRACTION      The following portions of the patient's history were reviewed and updated as appropriate: allergies, current medications, past family history, past medical history, past social history, past surgical history and problem list.  Review of Systems:  Pertinent items noted in HPI and remainder of comprehensive ROS otherwise negative.  Physical Exam:  BP 126/86   Pulse 86   Ht 5\' 4"  (1.626 m)   Wt 160 lb (72.6 kg)   LMP 03/15/2022   Breastfeeding Yes   BMI 27.46 kg/m  CONSTITUTIONAL: Well-developed, well-nourished female in no acute distress.  HEENT:  Normocephalic, atraumatic. External right and left ear normal. No scleral icterus.  NECK: Normal range of motion, supple, no masses noted on observation SKIN: No rash noted. Not diaphoretic. No erythema. No pallor. MUSCULOSKELETAL: Normal range of motion. No edema noted. NEUROLOGIC: Alert and oriented to person, place, and time. Normal muscle tone coordination. No cranial nerve deficit noted. PSYCHIATRIC: Normal mood and affect. Normal behavior. Normal  judgment and thought content.  PELVIC:  incision is healing well.   Labs and Imaging Results for orders placed or performed during the hospital encounter of 12/14/22 (from the past 168 hour(s))  CBC   Collection Time: 12/15/22  5:05 AM  Result Value Ref Range   WBC 12.3 (H) 4.0 - 10.5 K/uL   RBC 3.22 (L) 3.87 - 5.11 MIL/uL   Hemoglobin 9.6 (L) 12.0 - 15.0 g/dL   HCT 40.9 (L) 81.1 - 91.4 %   MCV 89.4 80.0 - 100.0 fL   MCH 29.8 26.0 - 34.0 pg   MCHC 33.3 30.0 - 36.0 g/dL   RDW 78.2 95.6 - 21.3 %   Platelets 144 (L) 150 - 400 K/uL   nRBC 0.0 0.0 - 0.2 %  Results for orders placed or performed during the hospital encounter of 12/12/22 (from the past 168 hour(s))  CBC   Collection Time: 12/12/22  9:22 AM  Result Value Ref Range   WBC 7.3 4.0 - 10.5 K/uL   RBC 4.04 3.87 - 5.11 MIL/uL   Hemoglobin 12.1 12.0 - 15.0 g/dL   HCT 08.6 57.8 - 46.9 %   MCV 90.1 80.0 - 100.0 fL   MCH 30.0 26.0 - 34.0 pg   MCHC 33.2 30.0 - 36.0 g/dL   RDW 62.9 52.8 - 41.3 %   Platelets 150 150 - 400 K/uL   nRBC 0.0 0.0 - 0.2 %  RPR   Collection Time: 12/12/22  9:22 AM  Result Value Ref Range   RPR  Ser Ql NON REACTIVE NON REACTIVE  Rapid HIV screen (HIV 1/2 Ab+Ag)   Collection Time: 12/12/22  9:22 AM  Result Value Ref Range   HIV-1 P24 Antigen - HIV24 NON REACTIVE NON REACTIVE   HIV 1/2 Antibodies NON REACTIVE NON REACTIVE   Interpretation (HIV Ag Ab)      A non reactive test result means that HIV 1 or HIV 2 antibodies and HIV 1 p24 antigen were not detected in the specimen.  Type and screen   Collection Time: 12/12/22  9:22 AM  Result Value Ref Range   ABO/RH(D) O NEG    Antibody Screen POS    Sample Expiration 12/15/2022,2359    Antibody Identification      PASSIVELY ACQUIRED ANTI-D Performed at [facility], [address] [place], [place] [address]    No results found.  Assessment and Plan:  Monique Pruitt was seen today for postpartum care.  Diagnoses and all orders for this  visit:  Postpartum headache - May continue excedrin. Reviewed it contains tylenol so don't take with tylenol but ibuprofen is okay.  - Discussed likely spinal HA. If persistent or worsens, would recommend MAU for possible blood patch.   Presence of surgical incision - Healing very well.     No orders of the defined types were placed in this encounter.    Routine preventative health maintenance measures emphasized. Please refer to After Visit Summary for other counseling recommendations.   No follow-ups on file.  Milas Hock, MD, FACOG Obstetrician & Gynecologist, [facility] for [facility], [facility]

## 2022-12-18 NOTE — Progress Notes (Signed)
Pt has been having headaches everyday- no visual changes. Pain from headache 9/10.

## 2022-12-21 ENCOUNTER — Ambulatory Visit: Payer: Commercial Managed Care - PPO

## 2023-01-25 ENCOUNTER — Encounter: Payer: Self-pay | Admitting: Obstetrics and Gynecology

## 2023-01-25 ENCOUNTER — Ambulatory Visit (INDEPENDENT_AMBULATORY_CARE_PROVIDER_SITE_OTHER): Payer: Commercial Managed Care - PPO | Admitting: Obstetrics and Gynecology

## 2023-01-25 DIAGNOSIS — M6289 Other specified disorders of muscle: Secondary | ICD-10-CM | POA: Diagnosis not present

## 2023-01-25 MED ORDER — NORETHINDRONE 0.35 MG PO TABS: 1.0000 | ORAL_TABLET | Freq: Every day | ORAL | 3 refills | Status: DC

## 2023-01-25 NOTE — Progress Notes (Signed)
Post Partum Visit Note  Monique Pruitt is a 32 y.o. G10P1001 female who presents for a postpartum visit. She is 6 weeks postpartum following a primary cesarean section.  I have fully reviewed the prenatal and intrapartum course. The delivery was at 39.1 gestational weeks.  Anesthesia: spinal. Postpartum course has been unremarkable. Baby is doing well. Baby is feeding by breast. Bleeding staining only. Bowel function is normal. Bladder function is normal. Patient is not sexually active. Contraception method is oral progesterone-only contraceptive. Postpartum depression screening: negative.   The pregnancy intention screening data noted above was reviewed. Potential methods of contraception were discussed. The patient elected to proceed with No data recorded.   Edinburgh Postnatal Depression Scale - 01/25/23 1023       Edinburgh Postnatal Depression Scale:  In the Past 7 Days   I have been able to laugh and see the funny side of things. 0    I have looked forward with enjoyment to things. 0    I have blamed myself unnecessarily when things went wrong. 0    I have been anxious or worried for no good reason. 1    I have felt scared or panicky for no good reason. 0    Things have been getting on top of me. 0    I have been so unhappy that I have had difficulty sleeping. 0    I have felt sad or miserable. 0    I have been so unhappy that I have been crying. 1    The thought of harming myself has occurred to me. 0    Edinburgh Postnatal Depression Scale Total 2             Health Maintenance Due  Topic Date Due   INFLUENZA VACCINE  12/07/2022   COVID-19 Vaccine (1 - 2023-24 season) Never done    The following portions of the patient's history were reviewed and updated as appropriate: allergies, current medications, past family history, past medical history, past social history, past surgical history, and problem list.  Review of Systems Pertinent items are noted in  HPI.  Objective:  BP 113/76   Pulse 94   Ht 5\' 4"  (1.626 m)   Wt 155 lb (70.3 kg)   LMP 03/15/2022   Breastfeeding Yes   BMI 26.61 kg/m    General:  alert, cooperative, and no distress   Breasts:  not indicated  Lungs: Normal effort  Heart:  Normal rate  Abdomen: soft, non-tender; bowel sounds normal; no masses,  no organomegaly   Wound well approximated incision  GU exam:  not indicated       Assessment:   6 wk postpartum exam.   Plan:   Essential components of care per ACOG recommendations:  1.  Mood and well being: Patient with negative depression screening today. Reviewed local resources for support.  - Patient tobacco use? No.   - hx of drug use? No.    2. Infant care and feeding:  -Patient currently breastmilk feeding? Yes. Reviewed importance of draining breast regularly to support lactation.  -Social determinants of health (SDOH) reviewed in EPIC. No concerns  3. Sexuality, contraception and birth spacing - Patient does not want a pregnancy in the next year.  Desired family size is unknown number of children.  - Reviewed reproductive life planning. Reviewed contraceptive methods based on pt preferences and effectiveness.  Patient desired Oral Contraceptive today.  She will do POP until done nursing at which point  we will switch to Junel.  - Discussed birth spacing of 18 months  4. Sleep and fatigue -Encouraged family/partner/community support of 4 hrs of uninterrupted sleep to help with mood and fatigue  5. Physical Recovery  - Discussed patients delivery and complications. She describes her labor as good. - Patient had a C-section.  - Patient has urinary incontinence? Yes. Discussed role of pelvic floor PT. Offered PT and patient accepted. Patient was referred to pelvic floor PT.  - Patient is safe to resume physical and sexual activity  6.  Health Maintenance - HM due items addressed Yes - Last pap smear  Diagnosis  Date Value Ref Range Status   10/10/2021   Final   - Negative for intraepithelial lesion or malignancy (NILM)   Pap smear not done at today's visit.  -Breast Cancer screening indicated? No.   7. Chronic Disease/Pregnancy Condition follow up: None  - PCP follow up  Milas Hock, MD Center for [facility], [facility]

## 2023-01-28 ENCOUNTER — Encounter: Payer: Self-pay | Admitting: Obstetrics and Gynecology

## 2023-01-28 DIAGNOSIS — N898 Other specified noninflammatory disorders of vagina: Secondary | ICD-10-CM

## 2023-01-29 ENCOUNTER — Other Ambulatory Visit: Payer: Self-pay

## 2023-01-29 ENCOUNTER — Ambulatory Visit: Payer: Commercial Managed Care - PPO | Attending: Obstetrics and Gynecology | Admitting: Physical Therapy

## 2023-01-29 DIAGNOSIS — M6281 Muscle weakness (generalized): Secondary | ICD-10-CM | POA: Insufficient documentation

## 2023-01-29 DIAGNOSIS — M62838 Other muscle spasm: Secondary | ICD-10-CM | POA: Insufficient documentation

## 2023-01-29 DIAGNOSIS — R279 Unspecified lack of coordination: Secondary | ICD-10-CM | POA: Insufficient documentation

## 2023-01-29 DIAGNOSIS — R293 Abnormal posture: Secondary | ICD-10-CM | POA: Diagnosis present

## 2023-01-29 DIAGNOSIS — M6289 Other specified disorders of muscle: Secondary | ICD-10-CM | POA: Diagnosis present

## 2023-01-29 MED ORDER — ESTRADIOL 0.1 MG/GM VA CREA: TOPICAL_CREAM | VAGINAL | 3 refills | Status: DC

## 2023-01-29 NOTE — Therapy (Signed)
OUTPATIENT PHYSICAL THERAPY FEMALE PELVIC EVALUATION   Patient Name: Monique Pruitt MRN: [id number] DOB:14-Oct-1990, 32 y.o., female Today's Date: 01/29/2023  END OF SESSION:  PT End of Session - 01/29/23 1244     Visit Number 1    Date for PT Re-Evaluation 05/31/23    Authorization Type [employer]    PT Start Time 1240   arrival time   PT Stop Time 1310    PT Time Calculation (min) 30 min    Activity Tolerance Patient tolerated treatment well    Behavior During Therapy [facility] for tasks assessed/performed             Past Medical History:  Diagnosis Date   Depression    Genital HSV    Migraine without aura 03/15/2021   Past Surgical History:  Procedure Laterality Date   CESAREAN SECTION N/A 12/14/2022   Procedure: CESAREAN SECTION;  Surgeon: Federico Flake, MD;  Location: [facility] LD ORS;  Service: Obstetrics;  Laterality: N/A;   WISDOM TOOTH EXTRACTION     Patient Active Problem List   Diagnosis Date Noted   History of C-section 12/14/2022   History of HSV 05/29/2022    PCP: not in chart  REFERRING PROVIDER: Milas Hock, MD   REFERRING DIAG: M62.89 (ICD-10-CM) - Pelvic floor dysfunction  THERAPY DIAG:  No diagnosis found.  Rationale for Evaluation and Treatment: Rehabilitation  ONSET DATE: 12/14/22  SUBJECTIVE:                                                                                                                                                                                           SUBJECTIVE STATEMENT: Sometimes hard or painful to pee, "It's like I can't relax enough to let myself pee." Still taking a stool softener but feels like it take a long time to empty, usually sitting 10-15 mins now. Before was much shorter.  Attempted intercourse unable due to pain.   Pt is breastfeeding with pump and latching  Wants to improve strength and mobility postpartum  Fluid intake: Yes: water - 100oz daily    PAIN:  Are you having pain? Yes NPRS  scale: 7/10 Pain location:  vagina  Pain type: sharp Pain description: intermittent   Aggravating factors: attempts at penetration Relieving factors: not attempting penetration   PRECAUTIONS: Other: postpartum   RED FLAGS: None   WEIGHT BEARING RESTRICTIONS: No  FALLS:  Has patient fallen in last 6 months? No  LIVING ENVIRONMENT: Lives with: lives with their family Lives in: House/apartment   OCCUPATION: on maternity leave   PLOF: Independent  PATIENT GOALS: to feel stronger after c-section and to have  less tightness at pelvic floor   PERTINENT HISTORY:  C-section x1, History of HSV; Rh negative state in antepartum period; and Choroid plexus cyst   Sexual abuse: No  BOWEL MOVEMENT: Pain with bowel movement: No Type of bowel movement:Type (Bristol Stool Scale) 4, Frequency daily sometimes every other day, and Strain Yes Fully empty rectum: Yes:   Leakage: No Pads: No Fiber supplement: No  URINATION: Pain with urination: Yes - hard or painful to urinate sometimes Fully empty bladder: Yes:   Stream: Strong Urgency: No Frequency: 2-4 hours Leakage:  none Pads: No  INTERCOURSE: Pain with intercourse: Initial Penetration Ability to have vaginal penetration:  No Climax: not yet Marinoff Scale: 3/3  PREGNANCY: Vaginal deliveries 0  C-section deliveries 1 Currently pregnant No  PROLAPSE: None   OBJECTIVE:   DIAGNOSTIC FINDINGS:    COGNITION: Overall cognitive status: Within functional limits for tasks assessed     SENSATION: Light touch: Appears intact Proprioception: Appears intact  MUSCLE LENGTH: HS: mild tightness bil Piriformis:WNL  LUMBAR SPECIAL TESTS:  Single leg stance test: 10s both legs, but unstable with hip drop bil and decreased core activation  FUNCTIONAL TESTS:  Functional squat - descent limited by 25% with Lt knee pain and mild bil valgus with initial return to standing  GAIT: WFL  POSTURE: rounded shoulders,  increased thoracic kyphosis, and anterior pelvic tilt  PELVIC ALIGNMENT:  LUMBARAROM/PROM:  WFL, some tightness with left rotation 25% deficit, TTP Rt glute with flexion  LOWER EXTREMITY ROM:  WFL  LOWER EXTREMITY MMT:  Bil hips 4+/5; knees 5/5  PALPATION:   General  TTP in bil gluteals Rt>Lt, lumbar with tightness noted,                 External Perineal Exam TTP at bil bulbocavernosus, and perineal body                             Internal Pelvic Floor tension at perineal body in downward direction, Lt obturator internus, bil pubococcygeus with TTP in these areas as well   Patient confirms identification and approves PT to assess internal pelvic floor and treatment Yes No emotional/communication barriers or cognitive limitation. Patient is motivated to learn. Patient understands and agrees with treatment goals and plan. PT explains patient will be examined in standing, sitting, and lying down to see how their muscles and joints work. When they are ready, they will be asked to remove their underwear so PT can examine their perineum. The patient is also given the option of providing their own chaperone as one is not provided in our facility. The patient also has the right and is explained the right to defer or refuse any part of the evaluation or treatment including the internal exam. With the patient's consent, PT will use one gloved finger to gently assess the muscles of the pelvic floor, seeing how well it contracts and relaxes and if there is muscle symmetry. After, the patient will get dressed and PT and patient will discuss exam findings and plan of care. PT and patient discuss plan of care, schedule, attendance policy and HEP activities.  PELVIC MMT:   MMT eval  Vaginal 3/5; 4s; 3 reps  Internal Anal Sphincter   External Anal Sphincter   Puborectalis   Diastasis Recti To be checked  (Blank rows = not tested)       C-section scar - healing well, pink scar line but fully  closed  and no scabbing. decreased mobility at lateral Rt quadrant and decreased fascial restrictions here as well in surrounding tissue.  TONE: WFL  PROLAPSE: Not seen in hooklying with cough  TODAY'S TREATMENT:                                                                                                                              DATE:   01/29/23 EVAL Examination completed, findings reviewed, pt educated on POC, HEP. Pt educated on log rolling to limit stress at c-section scarring and beginning gentle scar mobility around c-section scar in all directions to tolerance with light pressure. Pt motivated to participate in PT and agreeable to attempt recommendations.     PATIENT EDUCATION:  Education details: Horticulturist, commercial Person educated: Patient Education method: Explanation, Demonstration, Tactile cues, Verbal cues, and Handouts Education comprehension: verbalized understanding and returned demonstration  HOME EXERCISE PROGRAM: [id number]  ASSESSMENT:  CLINICAL IMPRESSION: Patient is a 32 y.o. female  who was seen today for physical therapy evaluation and treatment for postpartum status post c-section 12/14/22. Pt has history of pelvic floor pain and tension and does return with inability to have vaginal intercourse postpartum due to pain, poor core activation, and pain with urination. Pt found to have decreased flexibility in spine and hips bil, decreased strength at core and hips, decreased stability in single leg stance and decreased strength with ability to complete functional squat. Pt also has decreased mobility at c-section scar. Patient consented to internal pelvic floor assessment vaginally this date and found to have decreased strength, endurance, and coordination. TTP at bil bulbocavernosus, and perineal body and internally had TTP at bil pubococcygeus, and Lt obturator internus. Pt would benefit from additional PT to further address deficits.    OBJECTIVE IMPAIRMENTS: decreased  activity tolerance, decreased coordination, decreased endurance, decreased mobility, decreased strength, increased fascial restrictions, increased muscle spasms, impaired flexibility, impaired sensation, improper body mechanics, postural dysfunction, and pain.   ACTIVITY LIMITATIONS: standing, squatting, locomotion level, and caring for others  PARTICIPATION LIMITATIONS: interpersonal relationship and community activity  PERSONAL FACTORS: Time since onset of injury/illness/exacerbation and 1 comorbidity: c-section   are also affecting patient's functional outcome.   REHAB POTENTIAL: Good  CLINICAL DECISION MAKING: Stable/uncomplicated  EVALUATION COMPLEXITY: Low   GOALS: Goals reviewed with patient? Yes  SHORT TERM GOALS: Target date: 02/26/23  Pt to be I with HEP.  Baseline: Goal status: INITIAL  2.  Pt to demonstrate at least 3/5 pelvic floor strength and ability to hold contraction for at least 8 s for improved pelvic stability and decreased strain at pelvic floor/ decrease leakage.  Baseline:  Goal status: INITIAL  3.  Pt to demonstrate improved coordination of pelvic floor and breathing mechanics with 10# squat with appropriate synergistic patterns to decrease pain and leakage at least 50% of the time.    Baseline:  Goal status: INITIAL  4.  Pt to be I with pelvic wand use at home as needed for  improved perineal body mobility and decreased tension and pain internally as needed.  Baseline:  Goal status: INITIAL   LONG TERM GOALS: Target date: 05/31/23  Pt to be I with advanced HEP.  Baseline:  Goal status: INITIAL  2.  Pt to demonstrate at least 4/5 pelvic floor strength for improved pelvic stability and decreased strain at pelvic floor/ decrease leakage.  Baseline:  Goal status: INITIAL  3.  Pt to demonstrate improved coordination of pelvic floor and breathing mechanics with 30# squat with appropriate synergistic patterns to decrease pain and leakage at least 75% of  the time.    Baseline:  Goal status: INITIAL  4.  Pt to be I with scar mobility at home for decreased restrictions at c-section scar for improved mobility and activation at core muscles.  Baseline:  Goal status: INITIAL  5.  Pt to demonstrate no compensatory strategies with single leg Sit to stand x10 each leg due to improved pelvic stability for decreased pelvic floor overuse and tension. Baseline:  Goal status: INITIAL  6.  Pt will report 50% reduction of pain due to improvements in posture, strength, and muscle length for improved tolerance to vaginal penetration.  Baseline:  Goal status: INITIAL  PLAN:  PT FREQUENCY: 2x/week  PT DURATION:  12 sessions  PLANNED INTERVENTIONS: Therapeutic exercises, Therapeutic activity, Neuromuscular re-education, Balance training, Gait training, Patient/Family education, Self Care, Joint mobilization, Aquatic Therapy, Dry Needling, Cryotherapy, Moist heat, scar mobilization, Taping, Biofeedback, Manual therapy, and Re-evaluation  PLAN FOR NEXT SESSION: gentle beginner deep core stabilizing/activation (postpartum 1-2 levels); posture strengthening; diaphragmatic breathing; scar mobility; internal pelvic floor mobility and strengthening; coordination of pelvic floor with breathing and exercises    Otelia Sergeant, PT, DPT 09/23/243:30 PM

## 2023-03-27 ENCOUNTER — Ambulatory Visit: Payer: Commercial Managed Care - PPO | Attending: Obstetrics and Gynecology | Admitting: Physical Therapy

## 2023-03-27 DIAGNOSIS — R279 Unspecified lack of coordination: Secondary | ICD-10-CM | POA: Diagnosis not present

## 2023-03-27 DIAGNOSIS — M62838 Other muscle spasm: Secondary | ICD-10-CM | POA: Diagnosis present

## 2023-03-27 DIAGNOSIS — R293 Abnormal posture: Secondary | ICD-10-CM | POA: Diagnosis present

## 2023-03-27 DIAGNOSIS — M6281 Muscle weakness (generalized): Secondary | ICD-10-CM | POA: Diagnosis present

## 2023-03-27 DIAGNOSIS — M6289 Other specified disorders of muscle: Secondary | ICD-10-CM | POA: Diagnosis not present

## 2023-03-27 NOTE — Therapy (Addendum)
OUTPATIENT PHYSICAL THERAPY FEMALE PELVIC EVALUATION   Patient Name: Monique Pruitt MRN: [id number] DOB:1990/06/30, 32 y.o., female Today's Date: 03/27/2023  END OF SESSION:  PT End of Session - 03/27/23 0847     Visit Number 2    Date for PT Re-Evaluation 05/31/23    Authorization Type [employer]    PT Start Time 0845    PT Stop Time 0930    PT Time Calculation (min) 45 min    Activity Tolerance Patient tolerated treatment well    Behavior During Therapy [facility] for tasks assessed/performed             Past Medical History:  Diagnosis Date   Depression    Genital HSV    Migraine without aura 03/15/2021   Past Surgical History:  Procedure Laterality Date   CESAREAN SECTION N/A 12/14/2022   Procedure: CESAREAN SECTION;  Surgeon: Federico Flake, MD;  Location: [facility] LD ORS;  Service: Obstetrics;  Laterality: N/A;   WISDOM TOOTH EXTRACTION     Patient Active Problem List   Diagnosis Date Noted   History of C-section 12/14/2022   History of HSV 05/29/2022    PCP: not in chart  REFERRING PROVIDER: Milas Hock, MD   REFERRING DIAG: M62.89 (ICD-10-CM) - Pelvic floor dysfunction  THERAPY DIAG:  Other muscle spasm  Muscle weakness (generalized)  Abnormal posture  Unspecified lack of coordination  Rationale for Evaluation and Treatment: Rehabilitation  ONSET DATE: 12/14/22  SUBJECTIVE:                                                                                                                                                                                           SUBJECTIVE STATEMENT: Painful to pee and having painful intercourse, pain with bowel movements. Does report hemorrhoids and this has been worse per pt.    Fluid intake: Yes: water - 100oz daily    PAIN:  Are you having pain? Yes NPRS scale: 0-8/10 Pain location:  vagina  Pain type: sharp Pain description: intermittent   Aggravating factors: attempts at penetration, voiding bowel and  bladder Relieving factors: not attempting penetration   PRECAUTIONS: Other: postpartum   RED FLAGS: None   WEIGHT BEARING RESTRICTIONS: No  FALLS:  Has patient fallen in last 6 months? No  LIVING ENVIRONMENT: Lives with: lives with their family Lives in: House/apartment   OCCUPATION: on maternity leave   PLOF: Independent  PATIENT GOALS: to feel stronger after c-section and to have less tightness at pelvic floor   PERTINENT HISTORY:  C-section x1, History of HSV; Rh negative state in antepartum period; and Choroid plexus cyst   Sexual  abuse: No  BOWEL MOVEMENT: Pain with bowel movement: No Type of bowel movement:Type (Bristol Stool Scale) 4, Frequency daily sometimes every other day, and Strain Yes Fully empty rectum: Yes:   Leakage: No Pads: No Fiber supplement: No  URINATION: Pain with urination: Yes - hard or painful to urinate sometimes Fully empty bladder: Yes:   Stream: Strong Urgency: No Frequency: 2-4 hours Leakage:  none Pads: No  INTERCOURSE: Pain with intercourse: Initial Penetration Ability to have vaginal penetration:  No Climax: not yet Marinoff Scale: 3/3  PREGNANCY: Vaginal deliveries 0  C-section deliveries 1 Currently pregnant No  PROLAPSE: None   OBJECTIVE:   DIAGNOSTIC FINDINGS:    COGNITION: Overall cognitive status: Within functional limits for tasks assessed     SENSATION: Light touch: Appears intact Proprioception: Appears intact  MUSCLE LENGTH: HS: mild tightness bil Piriformis:WNL  LUMBAR SPECIAL TESTS:  Single leg stance test: 10s both legs, but unstable with hip drop bil and decreased core activation  FUNCTIONAL TESTS:  Functional squat - descent limited by 25% with Lt knee pain and mild bil valgus with initial return to standing  GAIT: WFL  POSTURE: rounded shoulders, increased thoracic kyphosis, and anterior pelvic tilt  PELVIC ALIGNMENT:  LUMBARAROM/PROM:  WFL, some tightness with left rotation  25% deficit, TTP Rt glute with flexion  LOWER EXTREMITY ROM:  WFL  LOWER EXTREMITY MMT:  Bil hips 4+/5; knees 5/5  PALPATION:   General  TTP in bil gluteals Rt>Lt, lumbar with tightness noted,                 External Perineal Exam TTP at bil bulbocavernosus, and perineal body                             Internal Pelvic Floor tension at perineal body in downward direction, Lt obturator internus, bil pubococcygeus with TTP in these areas as well   Patient confirms identification and approves PT to assess internal pelvic floor and treatment Yes No emotional/communication barriers or cognitive limitation. Patient is motivated to learn. Patient understands and agrees with treatment goals and plan. PT explains patient will be examined in standing, sitting, and lying down to see how their muscles and joints work. When they are ready, they will be asked to remove their underwear so PT can examine their perineum. The patient is also given the option of providing their own chaperone as one is not provided in our facility. The patient also has the right and is explained the right to defer or refuse any part of the evaluation or treatment including the internal exam. With the patient's consent, PT will use one gloved finger to gently assess the muscles of the pelvic floor, seeing how well it contracts and relaxes and if there is muscle symmetry. After, the patient will get dressed and PT and patient will discuss exam findings and plan of care. PT and patient discuss plan of care, schedule, attendance policy and HEP activities.  PELVIC MMT:   MMT eval 03/27/23   Vaginal 3/5; 4s; 3 reps 3/5  Internal Anal Sphincter    External Anal Sphincter    Puborectalis    Diastasis Recti  1 finger separation above and below umbilicus, mild density    (Blank rows = not tested)       C-section scar - healing well, pink scar line but fully closed and no scabbing. decreased mobility at lateral Rt quadrant and  decreased fascial restrictions  here as well in surrounding tissue.  03/27/23 - still pink at scar but improved mobility in all directions - pt does report continued sensitivity but improving  TONE: WFL  PROLAPSE: Not seen in hooklying with cough  TODAY'S TREATMENT:                                                                                                                              DATE:   01/29/23 EVAL Examination completed, findings reviewed, pt educated on POC, HEP. Pt educated on log rolling to limit stress at c-section scarring and beginning gentle scar mobility around c-section scar in all directions to tolerance with light pressure. Pt motivated to participate in PT and agreeable to attempt recommendations.    03/27/23: Pt educated on voiding mechanics and relaxation techniques for improved evacuation of bowels and bladder, abdominal massage also educated on with handouts given.  DRA separation assessed with findings above  2x10 transverse abdominis activations in hooklying with multiple reps with hands on tactile and verbal cues for improved technique and muscle activation with improvement noted.  Patient consented to internal pelvic floor treatment vaginally this date and found to have continued decreased strength, endurance, and coordination and TTP throughout superficial and deep layers and throughout vaginal walls. Pt tolerated well with minimal increase in pain but tension noted throughout. Gentle manual completed for trigger point release in superficial and deep layers Lt>Rt and gentle stretching at introitus for decreased tension and decreased pain with penetration.  PATIENT EDUCATION:  Education details: Horticulturist, commercial Person educated: Patient Education method: Explanation, Demonstration, Tactile cues, Verbal cues, and Handouts Education comprehension: verbalized understanding and returned demonstration  HOME EXERCISE PROGRAM: [id number]  ASSESSMENT:  CLINICAL  IMPRESSION: Patient presents for treatment, reports continued pain with postpartum mobility, penetration, and bladder and bowel voids. Pt tolerated session well, did consent to internal vaginal pelvic floor manual work today and found to have continued tension and TTP throughout but Lt worse than Lt. Pt reported improved mobility at end of session. Pt does continue to demonstrate core weakness and need of moderate cues for transverse abdominis activation in isolation and even more so with active crunch. Pt educated on all findings and given HEP print outs. As well as education on voiding mechanics and abdominal massage for iproved bowel habits. Pt would benefit from additional PT to further address deficits.    OBJECTIVE IMPAIRMENTS: decreased activity tolerance, decreased coordination, decreased endurance, decreased mobility, decreased strength, increased fascial restrictions, increased muscle spasms, impaired flexibility, impaired sensation, improper body mechanics, postural dysfunction, and pain.   ACTIVITY LIMITATIONS: standing, squatting, locomotion level, and caring for others  PARTICIPATION LIMITATIONS: interpersonal relationship and community activity  PERSONAL FACTORS: Time since onset of injury/illness/exacerbation and 1 comorbidity: c-section   are also affecting patient's functional outcome.   REHAB POTENTIAL: Good  CLINICAL DECISION MAKING: Stable/uncomplicated  EVALUATION COMPLEXITY: Low   GOALS: Goals reviewed with patient? Yes  SHORT TERM GOALS: Target date: 02/26/23  Pt to be I with HEP.  Baseline: Goal status: INITIAL  2.  Pt to demonstrate at least 3/5 pelvic floor strength and ability to hold contraction for at least 8 s for improved pelvic stability and decreased strain at pelvic floor/ decrease leakage.  Baseline:  Goal status: INITIAL  3.  Pt to demonstrate improved coordination of pelvic floor and breathing mechanics with 10# squat with appropriate synergistic  patterns to decrease pain and leakage at least 50% of the time.    Baseline:  Goal status: INITIAL  4.  Pt to be I with pelvic wand use at home as needed for improved perineal body mobility and decreased tension and pain internally as needed.  Baseline:  Goal status: INITIAL   LONG TERM GOALS: Target date: 05/31/23  Pt to be I with advanced HEP.  Baseline:  Goal status: INITIAL  2.  Pt to demonstrate at least 4/5 pelvic floor strength for improved pelvic stability and decreased strain at pelvic floor/ decrease leakage.  Baseline:  Goal status: INITIAL  3.  Pt to demonstrate improved coordination of pelvic floor and breathing mechanics with 30# squat with appropriate synergistic patterns to decrease pain and leakage at least 75% of the time.    Baseline:  Goal status: INITIAL  4.  Pt to be I with scar mobility at home for decreased restrictions at c-section scar for improved mobility and activation at core muscles.  Baseline:  Goal status: INITIAL  5.  Pt to demonstrate no compensatory strategies with single leg Sit to stand x10 each leg due to improved pelvic stability for decreased pelvic floor overuse and tension. Baseline:  Goal status: INITIAL  6.  Pt will report 50% reduction of pain due to improvements in posture, strength, and muscle length for improved tolerance to vaginal penetration.  Baseline:  Goal status: INITIAL  PLAN:  PT FREQUENCY: 2x/week  PT DURATION:  12 sessions  PLANNED INTERVENTIONS: Therapeutic exercises, Therapeutic activity, Neuromuscular re-education, Balance training, Gait training, Patient/Family education, Self Care, Joint mobilization, Aquatic Therapy, Dry Needling, Cryotherapy, Moist heat, scar mobilization, Taping, Biofeedback, Manual therapy, and Re-evaluation  PLAN FOR NEXT SESSION: gentle beginner deep core stabilizing/activation (postpartum 1-2 levels); posture strengthening; diaphragmatic breathing; scar mobility; internal pelvic floor  mobility and strengthening; coordination of pelvic floor with breathing and exercises    Otelia Sergeant, PT, DPT 03/26/2409:40 AM

## 2023-03-29 ENCOUNTER — Ambulatory Visit: Payer: Commercial Managed Care - PPO | Admitting: Physical Therapy

## 2023-03-29 DIAGNOSIS — R293 Abnormal posture: Secondary | ICD-10-CM

## 2023-03-29 DIAGNOSIS — M6281 Muscle weakness (generalized): Secondary | ICD-10-CM

## 2023-03-29 DIAGNOSIS — M6289 Other specified disorders of muscle: Secondary | ICD-10-CM | POA: Diagnosis not present

## 2023-03-29 DIAGNOSIS — R279 Unspecified lack of coordination: Secondary | ICD-10-CM

## 2023-03-29 NOTE — Therapy (Addendum)
OUTPATIENT PHYSICAL THERAPY FEMALE PELVIC EVALUATION   Patient Name: Monique Pruitt MRN: [id number] DOB:[birth year], 32 y.o., female Today's Date: 03/29/2023  END OF SESSION:  PT End of Session - 03/29/23 1618     Visit Number 3    Date for PT Re-Evaluation 05/31/23    Authorization Type [employer]    PT Start Time 1535    PT Stop Time 1615    PT Time Calculation (min) 40 min    Activity Tolerance Patient tolerated treatment well    Behavior During Therapy [facility] for tasks assessed/performed              Past Medical History:  Diagnosis Date   Depression    Genital HSV    Migraine without aura 03/15/2021   Past Surgical History:  Procedure Laterality Date   CESAREAN SECTION N/A 12/14/2022   Procedure: CESAREAN SECTION;  Surgeon: Federico Flake, MD;  Location: [facility] LD ORS;  Service: Obstetrics;  Laterality: N/A;   WISDOM TOOTH EXTRACTION     Patient Active Problem List   Diagnosis Date Noted   History of C-section 12/14/2022   History of HSV 05/29/2022    PCP: not in chart  REFERRING PROVIDER: Milas Hock, MD   REFERRING DIAG: M62.89 (ICD-10-CM) - Pelvic floor dysfunction  THERAPY DIAG:  Muscle weakness (generalized)  Abnormal posture  Unspecified lack of coordination  Rationale for Evaluation and Treatment: Rehabilitation  ONSET DATE: 12/14/22  SUBJECTIVE:                                                                                                                                                                                           SUBJECTIVE STATEMENT: Bowel movements are much better with needing to spend 7-10 mins much less pain (was incredibly painful and 20 mins). No pain with urination.   Fluid intake: Yes: water - 100oz daily    PAIN:  Are you having pain? Yes NPRS scale: 0-8/10 Pain location:  vagina  Pain type: sharp Pain description: intermittent   Aggravating factors: attempts at penetration, voiding bowel and  bladder Relieving factors: not attempting penetration   PRECAUTIONS: Other: postpartum   RED FLAGS: None   WEIGHT BEARING RESTRICTIONS: No  FALLS:  Has patient fallen in last 6 months? No  LIVING ENVIRONMENT: Lives with: lives with their family Lives in: House/apartment   OCCUPATION: on maternity leave   PLOF: Independent  PATIENT GOALS: to feel stronger after c-section and to have less tightness at pelvic floor   PERTINENT HISTORY:  C-section x1, History of HSV; Rh negative state in antepartum period; and Choroid plexus cyst   Sexual abuse:  No  BOWEL MOVEMENT: Pain with bowel movement: No Type of bowel movement:Type (Bristol Stool Scale) 4, Frequency daily sometimes every other day, and Strain Yes Fully empty rectum: Yes:   Leakage: No Pads: No Fiber supplement: No  URINATION: Pain with urination: Yes - hard or painful to urinate sometimes Fully empty bladder: Yes:   Stream: Strong Urgency: No Frequency: 2-4 hours Leakage:  none Pads: No  INTERCOURSE: Pain with intercourse: Initial Penetration Ability to have vaginal penetration:  No Climax: not yet Marinoff Scale: 3/3  PREGNANCY: Vaginal deliveries 0  C-section deliveries 1 Currently pregnant No  PROLAPSE: None   OBJECTIVE:   DIAGNOSTIC FINDINGS:    COGNITION: Overall cognitive status: Within functional limits for tasks assessed     SENSATION: Light touch: Appears intact Proprioception: Appears intact  MUSCLE LENGTH: HS: mild tightness bil Piriformis:WNL  LUMBAR SPECIAL TESTS:  Single leg stance test: 10s both legs, but unstable with hip drop bil and decreased core activation  FUNCTIONAL TESTS:  Functional squat - descent limited by 25% with Lt knee pain and mild bil valgus with initial return to standing  GAIT: WFL  POSTURE: rounded shoulders, increased thoracic kyphosis, and anterior pelvic tilt  PELVIC ALIGNMENT:  LUMBARAROM/PROM:  WFL, some tightness with left rotation  25% deficit, TTP Rt glute with flexion  LOWER EXTREMITY ROM:  WFL  LOWER EXTREMITY MMT:  Bil hips 4+/5; knees 5/5  PALPATION:   General  TTP in bil gluteals Rt>Lt, lumbar with tightness noted,                 External Perineal Exam TTP at bil bulbocavernosus, and perineal body                             Internal Pelvic Floor tension at perineal body in downward direction, Lt obturator internus, bil pubococcygeus with TTP in these areas as well   Patient confirms identification and approves PT to assess internal pelvic floor and treatment Yes No emotional/communication barriers or cognitive limitation. Patient is motivated to learn. Patient understands and agrees with treatment goals and plan. PT explains patient will be examined in standing, sitting, and lying down to see how their muscles and joints work. When they are ready, they will be asked to remove their underwear so PT can examine their perineum. The patient is also given the option of providing their own chaperone as one is not provided in our facility. The patient also has the right and is explained the right to defer or refuse any part of the evaluation or treatment including the internal exam. With the patient's consent, PT will use one gloved finger to gently assess the muscles of the pelvic floor, seeing how well it contracts and relaxes and if there is muscle symmetry. After, the patient will get dressed and PT and patient will discuss exam findings and plan of care. PT and patient discuss plan of care, schedule, attendance policy and HEP activities.  PELVIC MMT:   MMT eval 03/27/23   Vaginal 3/5; 4s; 3 reps 3/5  Internal Anal Sphincter    External Anal Sphincter    Puborectalis    Diastasis Recti  1 finger separation above and below umbilicus, mild density    (Blank rows = not tested)       C-section scar - healing well, pink scar line but fully closed and no scabbing. decreased mobility at lateral Rt quadrant and  decreased fascial restrictions here  as well in surrounding tissue.  03/27/23 - still pink at scar but improved mobility in all directions - pt does report continued sensitivity but improving  TONE: WFL  PROLAPSE: Not seen in hooklying with cough  TODAY'S TREATMENT:                                                                                                                              DATE:   01/29/23 EVAL Examination completed, findings reviewed, pt educated on POC, HEP. Pt educated on log rolling to limit stress at c-section scarring and beginning gentle scar mobility around c-section scar in all directions to tolerance with light pressure. Pt motivated to participate in PT and agreeable to attempt recommendations.    03/27/23: Pt educated on voiding mechanics and relaxation techniques for improved evacuation of bowels and bladder, abdominal massage also educated on with handouts given.  DRA separation assessed with findings above  2x10 transverse abdominis activations in hooklying with multiple reps with hands on tactile and verbal cues for improved technique and muscle activation with improvement noted.  Patient consented to internal pelvic floor treatment vaginally this date and found to have continued decreased strength, endurance, and coordination and TTP throughout superficial and deep layers and throughout vaginal walls. Pt tolerated well with minimal increase in pain but tension noted throughout. Gentle manual completed for trigger point release in superficial and deep layers Lt>Rt and gentle stretching at introitus for decreased tension and decreased pain with penetration.  03/29/23  NMRE: all exercises for exhale and pelvic floor contraction with activity for improved coordination of muscle activation and decreased stress at pelvic floor for decreased leakage.   Transverse abdominis activation in hooklying - several attempts with minor activation best improvement with cues for PPT  and ball squeeze between hands to help activation x20 Sidelying ball press with hip abduction 2x10 2x10 body weight squats with exhale on exertion  2x10 palloffs green band  Hooklying 2x10 green band bil shoulder extension +transverse abdominis activation and exhale Hooklying red loop marching + transverse abdominis activation and exhale Therapeutic exercise; 2x30s mini cobra Half kneel hip flexor stretch 2x30s each with trunk rotation each Trunk ext over bosu x1 min   PATIENT EDUCATION:  Education details: Horticulturist, commercial Person educated: Patient Education method: Explanation, Demonstration, Tactile cues, Verbal cues, and Handouts Education comprehension: verbalized understanding and returned demonstration  HOME EXERCISE PROGRAM: [id number]  ASSESSMENT:  CLINICAL IMPRESSION: Patient presents for treatment, reports improvement with bowel and bladder pain with voids. Session focused on core and hip strengthening and coordination of breathing with pelvic floor. Pt tolerated well and denied pain throughout. Pt would benefit from additional PT to further address deficits.    OBJECTIVE IMPAIRMENTS: decreased activity tolerance, decreased coordination, decreased endurance, decreased mobility, decreased strength, increased fascial restrictions, increased muscle spasms, impaired flexibility, impaired sensation, improper body mechanics, postural dysfunction, and pain.   ACTIVITY LIMITATIONS: standing, squatting, locomotion level, and caring for others  PARTICIPATION LIMITATIONS:  interpersonal relationship and community activity  PERSONAL FACTORS: Time since onset of injury/illness/exacerbation and 1 comorbidity: c-section   are also affecting patient's functional outcome.   REHAB POTENTIAL: Good  CLINICAL DECISION MAKING: Stable/uncomplicated  EVALUATION COMPLEXITY: Low   GOALS: Goals reviewed with patient? Yes  SHORT TERM GOALS: Target date: 02/26/23  Pt to be I with HEP.   Baseline: Goal status: INITIAL  2.  Pt to demonstrate at least 3/5 pelvic floor strength and ability to hold contraction for at least 8 s for improved pelvic stability and decreased strain at pelvic floor/ decrease leakage.  Baseline:  Goal status: INITIAL  3.  Pt to demonstrate improved coordination of pelvic floor and breathing mechanics with 10# squat with appropriate synergistic patterns to decrease pain and leakage at least 50% of the time.    Baseline:  Goal status: INITIAL  4.  Pt to be I with pelvic wand use at home as needed for improved perineal body mobility and decreased tension and pain internally as needed.  Baseline:  Goal status: INITIAL   LONG TERM GOALS: Target date: 05/31/23  Pt to be I with advanced HEP.  Baseline:  Goal status: INITIAL  2.  Pt to demonstrate at least 4/5 pelvic floor strength for improved pelvic stability and decreased strain at pelvic floor/ decrease leakage.  Baseline:  Goal status: INITIAL  3.  Pt to demonstrate improved coordination of pelvic floor and breathing mechanics with 30# squat with appropriate synergistic patterns to decrease pain and leakage at least 75% of the time.    Baseline:  Goal status: INITIAL  4.  Pt to be I with scar mobility at home for decreased restrictions at c-section scar for improved mobility and activation at core muscles.  Baseline:  Goal status: INITIAL  5.  Pt to demonstrate no compensatory strategies with single leg Sit to stand x10 each leg due to improved pelvic stability for decreased pelvic floor overuse and tension. Baseline:  Goal status: INITIAL  6.  Pt will report 50% reduction of pain due to improvements in posture, strength, and muscle length for improved tolerance to vaginal penetration.  Baseline:  Goal status: INITIAL  PLAN:  PT FREQUENCY: 2x/week  PT DURATION:  12 sessions  PLANNED INTERVENTIONS: Therapeutic exercises, Therapeutic activity, Neuromuscular re-education, Balance  training, Gait training, Patient/Family education, Self Care, Joint mobilization, Aquatic Therapy, Dry Needling, Cryotherapy, Moist heat, scar mobilization, Taping, Biofeedback, Manual therapy, and Re-evaluation  PLAN FOR NEXT SESSION: gentle beginner deep core stabilizing/activation (postpartum 1-2 levels); posture strengthening; diaphragmatic breathing; scar mobility; internal pelvic floor mobility and strengthening; coordination of pelvic floor with breathing and exercises    Otelia Sergeant, PT, DPT 11/21/244:19 PM

## 2023-04-03 ENCOUNTER — Ambulatory Visit: Payer: Commercial Managed Care - PPO | Admitting: Physical Therapy

## 2023-04-03 ENCOUNTER — Encounter: Payer: Self-pay | Admitting: Physical Therapy

## 2023-04-03 DIAGNOSIS — M6281 Muscle weakness (generalized): Secondary | ICD-10-CM

## 2023-04-03 DIAGNOSIS — M6289 Other specified disorders of muscle: Secondary | ICD-10-CM | POA: Diagnosis not present

## 2023-04-03 DIAGNOSIS — R293 Abnormal posture: Secondary | ICD-10-CM

## 2023-04-03 DIAGNOSIS — R279 Unspecified lack of coordination: Secondary | ICD-10-CM

## 2023-04-03 NOTE — Therapy (Signed)
OUTPATIENT PHYSICAL THERAPY FEMALE PELVIC TREATMENT   Patient Name: Monique Pruitt MRN: [id number] DOB:Jan 05, 1991, 32 y.o., female Today's Date: 04/03/2023  END OF SESSION:  PT End of Session - 04/03/23 1533     Visit Number 4    Date for PT Re-Evaluation 05/31/23    Authorization Type U[employer]    PT Start Time 1532    PT Stop Time 1613    PT Time Calculation (min) 41 min    Activity Tolerance Patient tolerated treatment well    Behavior During Therapy [facility] for tasks assessed/performed              Past Medical History:  Diagnosis Date   Depression    Genital HSV    Migraine without aura 03/15/2021   Past Surgical History:  Procedure Laterality Date   CESAREAN SECTION N/A 12/14/2022   Procedure: CESAREAN SECTION;  Surgeon: Federico Flake, MD;  Location: [facility] LD ORS;  Service: Obstetrics;  Laterality: N/A;   WISDOM TOOTH EXTRACTION     Patient Active Problem List   Diagnosis Date Noted   History of C-section 12/14/2022   History of HSV 05/29/2022    PCP: not in chart  REFERRING PROVIDER: Milas Hock, MD   REFERRING DIAG: M62.89 (ICD-10-CM) - Pelvic floor dysfunction  THERAPY DIAG:  Muscle weakness (generalized)  Abnormal posture  Unspecified lack of coordination  Rationale for Evaluation and Treatment: Rehabilitation  ONSET DATE: 12/14/22  SUBJECTIVE:                                                                                                                                                                                           SUBJECTIVE STATEMENT: Bowel movements are about the same as last appointment and stats overall decreased from 8/10 to 6-7/10, no longer sharp and starting bowel movement is ok but middle to end is most painful. No straining anymore and sitting less than 10 mins. Urination is no longer painful, still pain with intercourse pretty consistently.   Fluid intake: Yes: water - 100oz daily    PAIN:  04/03/23 no  Are  you having pain? Yes NPRS scale: 0-8/10 Pain location:  vagina  Pain type: sharp Pain description: intermittent   Aggravating factors: attempts at penetration, voiding bowel and bladder Relieving factors: not attempting penetration   PRECAUTIONS: Other: postpartum   RED FLAGS: None   WEIGHT BEARING RESTRICTIONS: No  FALLS:  Has patient fallen in last 6 months? No  LIVING ENVIRONMENT: Lives with: lives with their family Lives in: House/apartment   OCCUPATION: on maternity leave   PLOF: Independent  PATIENT GOALS: to feel stronger after c-section  and to have less tightness at pelvic floor   PERTINENT HISTORY:  C-section x1, History of HSV; Rh negative state in antepartum period; and Choroid plexus cyst   Sexual abuse: No  BOWEL MOVEMENT: Pain with bowel movement: No Type of bowel movement:Type (Bristol Stool Scale) 4, Frequency daily sometimes every other day, and Strain Yes Fully empty rectum: Yes:   Leakage: No Pads: No Fiber supplement: No  URINATION: Pain with urination: Yes - hard or painful to urinate sometimes Fully empty bladder: Yes:   Stream: Strong Urgency: No Frequency: 2-4 hours Leakage:  none Pads: No  INTERCOURSE: Pain with intercourse: Initial Penetration Ability to have vaginal penetration:  No Climax: not yet Marinoff Scale: 3/3  PREGNANCY: Vaginal deliveries 0  C-section deliveries 1 Currently pregnant No  PROLAPSE: None   OBJECTIVE:   DIAGNOSTIC FINDINGS:    COGNITION: Overall cognitive status: Within functional limits for tasks assessed     SENSATION: Light touch: Appears intact Proprioception: Appears intact  MUSCLE LENGTH: HS: mild tightness bil Piriformis:WNL  LUMBAR SPECIAL TESTS:  Single leg stance test: 10s both legs, but unstable with hip drop bil and decreased core activation  FUNCTIONAL TESTS:  Functional squat - descent limited by 25% with Lt knee pain and mild bil valgus with initial return to  standing  GAIT: WFL  POSTURE: rounded shoulders, increased thoracic kyphosis, and anterior pelvic tilt  PELVIC ALIGNMENT:  LUMBARAROM/PROM:  WFL, some tightness with left rotation 25% deficit, TTP Rt glute with flexion  LOWER EXTREMITY ROM:  WFL  LOWER EXTREMITY MMT:  Bil hips 4+/5; knees 5/5  PALPATION:   General  TTP in bil gluteals Rt>Lt, lumbar with tightness noted,                 External Perineal Exam TTP at bil bulbocavernosus, and perineal body                             Internal Pelvic Floor tension at perineal body in downward direction, Lt obturator internus, bil pubococcygeus with TTP in these areas as well   Patient confirms identification and approves PT to assess internal pelvic floor and treatment Yes No emotional/communication barriers or cognitive limitation. Patient is motivated to learn. Patient understands and agrees with treatment goals and plan. PT explains patient will be examined in standing, sitting, and lying down to see how their muscles and joints work. When they are ready, they will be asked to remove their underwear so PT can examine their perineum. The patient is also given the option of providing their own chaperone as one is not provided in our facility. The patient also has the right and is explained the right to defer or refuse any part of the evaluation or treatment including the internal exam. With the patient's consent, PT will use one gloved finger to gently assess the muscles of the pelvic floor, seeing how well it contracts and relaxes and if there is muscle symmetry. After, the patient will get dressed and PT and patient will discuss exam findings and plan of care. PT and patient discuss plan of care, schedule, attendance policy and HEP activities.  PELVIC MMT:   MMT eval 03/27/23   Vaginal 3/5; 4s; 3 reps 3/5  Internal Anal Sphincter    External Anal Sphincter    Puborectalis    Diastasis Recti  1 finger separation above and below  umbilicus, mild density    (Blank rows = not  tested)       C-section scar - healing well, pink scar line but fully closed and no scabbing. decreased mobility at lateral Rt quadrant and decreased fascial restrictions here as well in surrounding tissue.  03/27/23 - still pink at scar but improved mobility in all directions - pt does report continued sensitivity but improving  TONE: WFL  PROLAPSE: Not seen in hooklying with cough  TODAY'S TREATMENT:                                                                                                                              DATE:   03/27/23: Pt educated on voiding mechanics and relaxation techniques for improved evacuation of bowels and bladder, abdominal massage also educated on with handouts given.  DRA separation assessed with findings above  2x10 transverse abdominis activations in hooklying with multiple reps with hands on tactile and verbal cues for improved technique and muscle activation with improvement noted.  Patient consented to internal pelvic floor treatment vaginally this date and found to have continued decreased strength, endurance, and coordination and TTP throughout superficial and deep layers and throughout vaginal walls. Pt tolerated well with minimal increase in pain but tension noted throughout. Gentle manual completed for trigger point release in superficial and deep layers Lt>Rt and gentle stretching at introitus for decreased tension and decreased pain with penetration.  03/29/23  NMRE: all exercises for exhale and pelvic floor contraction with activity for improved coordination of muscle activation and decreased stress at pelvic floor for decreased leakage.   Transverse abdominis activation in hooklying - several attempts with minor activation best improvement with cues for PPT and ball squeeze between hands to help activation x20 Sidelying ball press with hip abduction 2x10 2x10 body weight squats with exhale on exertion   2x10 palloffs green band  Hooklying 2x10 green band bil shoulder extension +transverse abdominis activation and exhale Hooklying red loop marching + transverse abdominis activation and exhale Therapeutic exercise; 2x30s mini cobra Half kneel hip flexor stretch 2x30s each with trunk rotation each Trunk ext over bosu x1 min  04/03/23:  Patient consented to internal pelvic floor treatment vaginally this date and found to have continued tightness throughout bil deep pelvic floor layer but Lt side much more tight and also tight with trigger points at superficial layer on Lt. Pt reports improvement at end of session with gentle stretching and trigger point release. Pt tolerated well, no pain at end of session. Pt does still have pelvic wand, and educated again on use of this vs. Vibration wand DME for improved muscle relaxation and tissue mobility for decreased pain, improved ability to void urine and stool.  Vibration plate - 2x1 min sitting in happy baby  PATIENT EDUCATION:  Education details: Horticulturist, commercial Person educated: Patient Education method: Explanation, Demonstration, Tactile cues, Verbal cues, and Handouts Education comprehension: verbalized understanding and returned demonstration  HOME EXERCISE PROGRAM: [id number]  ASSESSMENT:  CLINICAL IMPRESSION: Patient presents for treatment,  consented to internal treatment, does continue to have pain, improved with manual work. Pt reports feeling much better at end of session. Pt would benefit from additional PT to further address deficits.    OBJECTIVE IMPAIRMENTS: decreased activity tolerance, decreased coordination, decreased endurance, decreased mobility, decreased strength, increased fascial restrictions, increased muscle spasms, impaired flexibility, impaired sensation, improper body mechanics, postural dysfunction, and pain.   ACTIVITY LIMITATIONS: standing, squatting, locomotion level, and caring for others  PARTICIPATION LIMITATIONS:  interpersonal relationship and community activity  PERSONAL FACTORS: Time since onset of injury/illness/exacerbation and 1 comorbidity: c-section   are also affecting patient's functional outcome.   REHAB POTENTIAL: Good  CLINICAL DECISION MAKING: Stable/uncomplicated  EVALUATION COMPLEXITY: Low   GOALS: Goals reviewed with patient? Yes  SHORT TERM GOALS: Target date: 02/26/23  Pt to be I with HEP.  Baseline: Goal status: INITIAL  2.  Pt to demonstrate at least 3/5 pelvic floor strength and ability to hold contraction for at least 8 s for improved pelvic stability and decreased strain at pelvic floor/ decrease leakage.  Baseline:  Goal status: INITIAL  3.  Pt to demonstrate improved coordination of pelvic floor and breathing mechanics with 10# squat with appropriate synergistic patterns to decrease pain and leakage at least 50% of the time.    Baseline:  Goal status: INITIAL  4.  Pt to be I with pelvic wand use at home as needed for improved perineal body mobility and decreased tension and pain internally as needed.  Baseline:  Goal status: INITIAL   LONG TERM GOALS: Target date: 05/31/23  Pt to be I with advanced HEP.  Baseline:  Goal status: INITIAL  2.  Pt to demonstrate at least 4/5 pelvic floor strength for improved pelvic stability and decreased strain at pelvic floor/ decrease leakage.  Baseline:  Goal status: INITIAL  3.  Pt to demonstrate improved coordination of pelvic floor and breathing mechanics with 30# squat with appropriate synergistic patterns to decrease pain and leakage at least 75% of the time.    Baseline:  Goal status: INITIAL  4.  Pt to be I with scar mobility at home for decreased restrictions at c-section scar for improved mobility and activation at core muscles.  Baseline:  Goal status: INITIAL  5.  Pt to demonstrate no compensatory strategies with single leg Sit to stand x10 each leg due to improved pelvic stability for decreased pelvic  floor overuse and tension. Baseline:  Goal status: INITIAL  6.  Pt will report 50% reduction of pain due to improvements in posture, strength, and muscle length for improved tolerance to vaginal penetration.  Baseline:  Goal status: INITIAL  PLAN:  PT FREQUENCY: 2x/week  PT DURATION:  12 sessions  PLANNED INTERVENTIONS: Therapeutic exercises, Therapeutic activity, Neuromuscular re-education, Balance training, Gait training, Patient/Family education, Self Care, Joint mobilization, Aquatic Therapy, Dry Needling, Cryotherapy, Moist heat, scar mobilization, Taping, Biofeedback, Manual therapy, and Re-evaluation  PLAN FOR NEXT SESSION: gentle beginner deep core stabilizing/activation (postpartum 1-2 levels); posture strengthening; diaphragmatic breathing; scar mobility; internal pelvic floor mobility and strengthening; coordination of pelvic floor with breathing and exercises    Otelia Sergeant, PT, DPT 11/26/246:08 PM

## 2023-04-04 ENCOUNTER — Encounter: Payer: Commercial Managed Care - PPO | Admitting: Physical Therapy

## 2023-04-10 ENCOUNTER — Ambulatory Visit: Payer: Commercial Managed Care - PPO | Attending: Obstetrics and Gynecology | Admitting: Physical Therapy

## 2023-04-10 DIAGNOSIS — R279 Unspecified lack of coordination: Secondary | ICD-10-CM | POA: Diagnosis present

## 2023-04-10 DIAGNOSIS — M6281 Muscle weakness (generalized): Secondary | ICD-10-CM | POA: Insufficient documentation

## 2023-04-10 DIAGNOSIS — R293 Abnormal posture: Secondary | ICD-10-CM | POA: Insufficient documentation

## 2023-04-10 DIAGNOSIS — M62838 Other muscle spasm: Secondary | ICD-10-CM | POA: Insufficient documentation

## 2023-04-10 NOTE — Therapy (Signed)
OUTPATIENT PHYSICAL THERAPY FEMALE PELVIC TREATMENT   Patient Name: Monique Pruitt MRN: [id number] DOB:09-24-1990, 32 y.o., female Today's Date: 04/10/2023  END OF SESSION:  PT End of Session - 04/10/23 1537     Visit Number 5    Date for PT Re-Evaluation 05/31/23    Authorization Type [employer]    PT Start Time 1532    PT Stop Time 1614    PT Time Calculation (min) 42 min    Activity Tolerance Patient tolerated treatment well    Behavior During Therapy [facility] for tasks assessed/performed              Past Medical History:  Diagnosis Date   Depression    Genital HSV    Migraine without aura 03/15/2021   Past Surgical History:  Procedure Laterality Date   CESAREAN SECTION N/A 12/14/2022   Procedure: CESAREAN SECTION;  Surgeon: Federico Flake, MD;  Location: [facility] LD ORS;  Service: Obstetrics;  Laterality: N/A;   WISDOM TOOTH EXTRACTION     Patient Active Problem List   Diagnosis Date Noted   History of C-section 12/14/2022   History of HSV 05/29/2022    PCP: not in chart  REFERRING PROVIDER: Milas Hock, MD   REFERRING DIAG: M62.89 (ICD-10-CM) - Pelvic floor dysfunction  THERAPY DIAG:  Muscle weakness (generalized)  Abnormal posture  Unspecified lack of coordination  Rationale for Evaluation and Treatment: Rehabilitation  ONSET DATE: 12/14/22  SUBJECTIVE:                                                                                                                                                                                           SUBJECTIVE STATEMENT: Urine has not been painful at all, stools continue to be 7/10 painful with passing stool. No trouble starting is drinking 90 oz of water daily, and stool softeners.   Fluid intake: Yes: water - 100oz daily    PAIN:  04/10/23  no  Are you having pain? Yes NPRS scale: 0-8/10 Pain location:  vagina  Pain type: sharp Pain description: intermittent   Aggravating factors: attempts at  penetration, voiding bowel and bladder Relieving factors: not attempting penetration   PRECAUTIONS: Other: postpartum   RED FLAGS: None   WEIGHT BEARING RESTRICTIONS: No  FALLS:  Has patient fallen in last 6 months? No  LIVING ENVIRONMENT: Lives with: lives with their family Lives in: House/apartment   OCCUPATION: on maternity leave   PLOF: Independent  PATIENT GOALS: to feel stronger after c-section and to have less tightness at pelvic floor   PERTINENT HISTORY:  C-section x1, History of HSV; Rh negative state in antepartum  period; and Choroid plexus cyst   Sexual abuse: No  BOWEL MOVEMENT: Pain with bowel movement: No Type of bowel movement:Type (Bristol Stool Scale) 4, Frequency daily sometimes every other day, and Strain Yes Fully empty rectum: Yes:   Leakage: No Pads: No Fiber supplement: No  URINATION: Pain with urination: Yes - hard or painful to urinate sometimes Fully empty bladder: Yes:   Stream: Strong Urgency: No Frequency: 2-4 hours Leakage:  none Pads: No  INTERCOURSE: Pain with intercourse: Initial Penetration Ability to have vaginal penetration:  No Climax: not yet Marinoff Scale: 3/3  PREGNANCY: Vaginal deliveries 0  C-section deliveries 1 Currently pregnant No  PROLAPSE: None   OBJECTIVE:   DIAGNOSTIC FINDINGS:    COGNITION: Overall cognitive status: Within functional limits for tasks assessed     SENSATION: Light touch: Appears intact Proprioception: Appears intact  MUSCLE LENGTH: HS: mild tightness bil Piriformis:WNL  LUMBAR SPECIAL TESTS:  Single leg stance test: 10s both legs, but unstable with hip drop bil and decreased core activation  FUNCTIONAL TESTS:  Functional squat - descent limited by 25% with Lt knee pain and mild bil valgus with initial return to standing  GAIT: WFL  POSTURE: rounded shoulders, increased thoracic kyphosis, and anterior pelvic tilt  PELVIC ALIGNMENT:  LUMBARAROM/PROM:  WFL,  some tightness with left rotation 25% deficit, TTP Rt glute with flexion  LOWER EXTREMITY ROM:  WFL  LOWER EXTREMITY MMT:  Bil hips 4+/5; knees 5/5  PALPATION:   General  TTP in bil gluteals Rt>Lt, lumbar with tightness noted,                 External Perineal Exam TTP at bil bulbocavernosus, and perineal body                             Internal Pelvic Floor tension at perineal body in downward direction, Lt obturator internus, bil pubococcygeus with TTP in these areas as well   Patient confirms identification and approves PT to assess internal pelvic floor and treatment Yes No emotional/communication barriers or cognitive limitation. Patient is motivated to learn. Patient understands and agrees with treatment goals and plan. PT explains patient will be examined in standing, sitting, and lying down to see how their muscles and joints work. When they are ready, they will be asked to remove their underwear so PT can examine their perineum. The patient is also given the option of providing their own chaperone as one is not provided in our facility. The patient also has the right and is explained the right to defer or refuse any part of the evaluation or treatment including the internal exam. With the patient's consent, PT will use one gloved finger to gently assess the muscles of the pelvic floor, seeing how well it contracts and relaxes and if there is muscle symmetry. After, the patient will get dressed and PT and patient will discuss exam findings and plan of care. PT and patient discuss plan of care, schedule, attendance policy and HEP activities.  PELVIC MMT:   MMT eval 03/27/23   Vaginal 3/5; 4s; 3 reps 3/5  Internal Anal Sphincter    External Anal Sphincter    Puborectalis    Diastasis Recti  1 finger separation above and below umbilicus, mild density    (Blank rows = not tested)       C-section scar - healing well, pink scar line but fully closed and no scabbing. decreased mobility  at lateral Rt quadrant and decreased fascial restrictions here as well in surrounding tissue.  03/27/23 - still pink at scar but improved mobility in all directions - pt does report continued sensitivity but improving  TONE: WFL  PROLAPSE: Not seen in hooklying with cough  TODAY'S TREATMENT:                                                                                                                              DATE:   03/29/23  NMRE: all exercises for exhale and pelvic floor contraction with activity for improved coordination of muscle activation and decreased stress at pelvic floor for decreased leakage.   Transverse abdominis activation in hooklying - several attempts with minor activation best improvement with cues for PPT and ball squeeze between hands to help activation x20 Sidelying ball press with hip abduction 2x10 2x10 body weight squats with exhale on exertion  2x10 palloffs green band  Hooklying 2x10 green band bil shoulder extension +transverse abdominis activation and exhale Hooklying red loop marching + transverse abdominis activation and exhale Therapeutic exercise; 2x30s mini cobra Half kneel hip flexor stretch 2x30s each with trunk rotation each Trunk ext over bosu x1 min  04/03/23:  Patient consented to internal pelvic floor treatment vaginally this date and found to have continued tightness throughout bil deep pelvic floor layer but Lt side much more tight and also tight with trigger points at superficial layer on Lt. Pt reports improvement at end of session with gentle stretching and trigger point release. Pt tolerated well, no pain at end of session. Pt does still have pelvic wand, and educated again on use of this vs. Vibration wand DME for improved muscle relaxation and tissue mobility for decreased pain, improved ability to void urine and stool.  Vibration plate - 2x1 min sitting in happy baby  04/10/23: Fire hydrants 2x10 yellow loop Donkey kicks 2x10 yellow  loop Dead bugs x10 Heel taps 2x10  Hooklying bil shoulder extension red band 2x10 Squats 10# 2x10  PATIENT EDUCATION:  Education details: Horticulturist, commercial Person educated: Patient Education method: Explanation, Demonstration, Tactile cues, Verbal cues, and Handouts Education comprehension: verbalized understanding and returned demonstration  HOME EXERCISE PROGRAM: [id number]  ASSESSMENT:  CLINICAL IMPRESSION: Patient presents for treatment, focus on strengthening hips and core after pregnancy to improve strength for functional tasks with childcare. Pt benefited from cues for technique throughout and for core activity. Pt would benefit from additional PT to further address deficits.    OBJECTIVE IMPAIRMENTS: decreased activity tolerance, decreased coordination, decreased endurance, decreased mobility, decreased strength, increased fascial restrictions, increased muscle spasms, impaired flexibility, impaired sensation, improper body mechanics, postural dysfunction, and pain.   ACTIVITY LIMITATIONS: standing, squatting, locomotion level, and caring for others  PARTICIPATION LIMITATIONS: interpersonal relationship and community activity  PERSONAL FACTORS: Time since onset of injury/illness/exacerbation and 1 comorbidity: c-section   are also affecting patient's functional outcome.   REHAB POTENTIAL: Good  CLINICAL DECISION MAKING: Stable/uncomplicated  EVALUATION  COMPLEXITY: Low   GOALS: Goals reviewed with patient? Yes  SHORT TERM GOALS: Target date: 02/26/23  Pt to be I with HEP.  Baseline: Goal status: INITIAL  2.  Pt to demonstrate at least 3/5 pelvic floor strength and ability to hold contraction for at least 8 s for improved pelvic stability and decreased strain at pelvic floor/ decrease leakage.  Baseline:  Goal status: INITIAL  3.  Pt to demonstrate improved coordination of pelvic floor and breathing mechanics with 10# squat with appropriate synergistic patterns to decrease  pain and leakage at least 50% of the time.    Baseline:  Goal status: INITIAL  4.  Pt to be I with pelvic wand use at home as needed for improved perineal body mobility and decreased tension and pain internally as needed.  Baseline:  Goal status: INITIAL   LONG TERM GOALS: Target date: 05/31/23  Pt to be I with advanced HEP.  Baseline:  Goal status: INITIAL  2.  Pt to demonstrate at least 4/5 pelvic floor strength for improved pelvic stability and decreased strain at pelvic floor/ decrease leakage.  Baseline:  Goal status: INITIAL  3.  Pt to demonstrate improved coordination of pelvic floor and breathing mechanics with 30# squat with appropriate synergistic patterns to decrease pain and leakage at least 75% of the time.    Baseline:  Goal status: INITIAL  4.  Pt to be I with scar mobility at home for decreased restrictions at c-section scar for improved mobility and activation at core muscles.  Baseline:  Goal status: INITIAL  5.  Pt to demonstrate no compensatory strategies with single leg Sit to stand x10 each leg due to improved pelvic stability for decreased pelvic floor overuse and tension. Baseline:  Goal status: INITIAL  6.  Pt will report 50% reduction of pain due to improvements in posture, strength, and muscle length for improved tolerance to vaginal penetration.  Baseline:  Goal status: INITIAL  PLAN:  PT FREQUENCY: 2x/week  PT DURATION:  12 sessions  PLANNED INTERVENTIONS: Therapeutic exercises, Therapeutic activity, Neuromuscular re-education, Balance training, Gait training, Patient/Family education, Self Care, Joint mobilization, Aquatic Therapy, Dry Needling, Cryotherapy, Moist heat, scar mobilization, Taping, Biofeedback, Manual therapy, and Re-evaluation  PLAN FOR NEXT SESSION: gentle beginner deep core stabilizing/activation (postpartum 1-2 levels); posture strengthening; diaphragmatic breathing; scar mobility; internal pelvic floor mobility and  strengthening; coordination of pelvic floor with breathing and exercises    Otelia Sergeant, PT, DPT 12/03/244:14 PM

## 2023-04-11 ENCOUNTER — Other Ambulatory Visit: Payer: Self-pay | Admitting: Obstetrics and Gynecology

## 2023-04-11 ENCOUNTER — Encounter: Payer: Self-pay | Admitting: Physical Therapy

## 2023-04-11 ENCOUNTER — Ambulatory Visit: Payer: Commercial Managed Care - PPO | Admitting: Physical Therapy

## 2023-04-11 ENCOUNTER — Encounter: Payer: Self-pay | Admitting: Obstetrics and Gynecology

## 2023-04-11 DIAGNOSIS — M6281 Muscle weakness (generalized): Secondary | ICD-10-CM

## 2023-04-11 DIAGNOSIS — K6289 Other specified diseases of anus and rectum: Secondary | ICD-10-CM

## 2023-04-11 DIAGNOSIS — M62838 Other muscle spasm: Secondary | ICD-10-CM

## 2023-04-11 DIAGNOSIS — R279 Unspecified lack of coordination: Secondary | ICD-10-CM

## 2023-04-11 DIAGNOSIS — R293 Abnormal posture: Secondary | ICD-10-CM

## 2023-04-11 NOTE — Therapy (Signed)
OUTPATIENT PHYSICAL THERAPY FEMALE PELVIC TREATMENT   Patient Name: Monique Pruitt MRN: [id number] DOB:04/17/1991, 32 y.o., female Today's Date: 04/11/2023  END OF SESSION:  PT End of Session - 04/11/23 0932     Visit Number 6    Date for PT Re-Evaluation 05/31/23    Authorization Type [employer]    PT Start Time 0930    PT Stop Time 1010    PT Time Calculation (min) 40 min    Activity Tolerance Patient tolerated treatment well    Behavior During Therapy [facility] for tasks assessed/performed              Past Medical History:  Diagnosis Date   Depression    Genital HSV    Migraine without aura 03/15/2021   Past Surgical History:  Procedure Laterality Date   CESAREAN SECTION N/A 12/14/2022   Procedure: CESAREAN SECTION;  Surgeon: Federico Flake, MD;  Location: [facility] LD ORS;  Service: Obstetrics;  Laterality: N/A;   WISDOM TOOTH EXTRACTION     Patient Active Problem List   Diagnosis Date Noted   History of C-section 12/14/2022   History of HSV 05/29/2022    PCP: not in chart  REFERRING PROVIDER: Milas Hock, MD   REFERRING DIAG: M62.89 (ICD-10-CM) - Pelvic floor dysfunction  THERAPY DIAG:  Muscle weakness (generalized)  Abnormal posture  Unspecified lack of coordination  Other muscle spasm  Rationale for Evaluation and Treatment: Rehabilitation  ONSET DATE: 12/14/22  SUBJECTIVE:                                                                                                                                                                                           SUBJECTIVE STATEMENT: Urine has not been painful at all, stools continue to be 7/10 painful with passing stool. No trouble starting is drinking 90 oz of water daily, and stool softeners.   Fluid intake: Yes: water - 100oz daily    PAIN:  04/10/23  no  Are you having pain? Yes NPRS scale: 0-8/10 Pain location:  vagina  Pain type: sharp Pain description: intermittent   Aggravating  factors: attempts at penetration, voiding bowel and bladder Relieving factors: not attempting penetration   PRECAUTIONS: Other: postpartum   RED FLAGS: None   WEIGHT BEARING RESTRICTIONS: No  FALLS:  Has patient fallen in last 6 months? No  LIVING ENVIRONMENT: Lives with: lives with their family Lives in: House/apartment   OCCUPATION: on maternity leave   PLOF: Independent  PATIENT GOALS: to feel stronger after c-section and to have less tightness at pelvic floor   PERTINENT HISTORY:  C-section x1, History of HSV; Rh  negative state in antepartum period; and Choroid plexus cyst   Sexual abuse: No  BOWEL MOVEMENT: Pain with bowel movement: No Type of bowel movement:Type (Bristol Stool Scale) 4, Frequency daily sometimes every other day, and Strain Yes Fully empty rectum: Yes:   Leakage: No Pads: No Fiber supplement: No  URINATION: Pain with urination: Yes - hard or painful to urinate sometimes Fully empty bladder: Yes:   Stream: Strong Urgency: No Frequency: 2-4 hours Leakage:  none Pads: No  INTERCOURSE: Pain with intercourse: Initial Penetration Ability to have vaginal penetration:  No Climax: not yet Marinoff Scale: 3/3  PREGNANCY: Vaginal deliveries 0  C-section deliveries 1 Currently pregnant No  PROLAPSE: None   OBJECTIVE:   DIAGNOSTIC FINDINGS:    COGNITION: Overall cognitive status: Within functional limits for tasks assessed     SENSATION: Light touch: Appears intact Proprioception: Appears intact  MUSCLE LENGTH: HS: mild tightness bil Piriformis:WNL  LUMBAR SPECIAL TESTS:  Single leg stance test: 10s both legs, but unstable with hip drop bil and decreased core activation  FUNCTIONAL TESTS:  Functional squat - descent limited by 25% with Lt knee pain and mild bil valgus with initial return to standing  GAIT: WFL  POSTURE: rounded shoulders, increased thoracic kyphosis, and anterior pelvic tilt  PELVIC  ALIGNMENT:  LUMBARAROM/PROM:  WFL, some tightness with left rotation 25% deficit, TTP Rt glute with flexion  LOWER EXTREMITY ROM:  WFL  LOWER EXTREMITY MMT:  Bil hips 4+/5; knees 5/5  PALPATION:   General  TTP in bil gluteals Rt>Lt, lumbar with tightness noted,                 External Perineal Exam TTP at bil bulbocavernosus, and perineal body                             Internal Pelvic Floor tension at perineal body in downward direction, Lt obturator internus, bil pubococcygeus with TTP in these areas as well   Patient confirms identification and approves PT to assess internal pelvic floor and treatment Yes No emotional/communication barriers or cognitive limitation. Patient is motivated to learn. Patient understands and agrees with treatment goals and plan. PT explains patient will be examined in standing, sitting, and lying down to see how their muscles and joints work. When they are ready, they will be asked to remove their underwear so PT can examine their perineum. The patient is also given the option of providing their own chaperone as one is not provided in our facility. The patient also has the right and is explained the right to defer or refuse any part of the evaluation or treatment including the internal exam. With the patient's consent, PT will use one gloved finger to gently assess the muscles of the pelvic floor, seeing how well it contracts and relaxes and if there is muscle symmetry. After, the patient will get dressed and PT and patient will discuss exam findings and plan of care. PT and patient discuss plan of care, schedule, attendance policy and HEP activities.  PELVIC MMT:   MMT eval 03/27/23   Vaginal 3/5; 4s; 3 reps 3/5  Internal Anal Sphincter    External Anal Sphincter    Puborectalis    Diastasis Recti  1 finger separation above and below umbilicus, mild density    (Blank rows = not tested)       C-section scar - healing well, pink scar line but fully  closed and  no scabbing. decreased mobility at lateral Rt quadrant and decreased fascial restrictions here as well in surrounding tissue.  03/27/23 - still pink at scar but improved mobility in all directions - pt does report continued sensitivity but improving  TONE: WFL  PROLAPSE: Not seen in hooklying with cough  TODAY'S TREATMENT:                                                                                                                              DATE:   04/03/23:  Patient consented to internal pelvic floor treatment vaginally this date and found to have continued tightness throughout bil deep pelvic floor layer but Lt side much more tight and also tight with trigger points at superficial layer on Lt. Pt reports improvement at end of session with gentle stretching and trigger point release. Pt tolerated well, no pain at end of session. Pt does still have pelvic wand, and educated again on use of this vs. Vibration wand DME for improved muscle relaxation and tissue mobility for decreased pain, improved ability to void urine and stool.  Vibration plate - 2x1 min sitting in happy baby  04/10/23: Fire hydrants 2x10 yellow loop Donkey kicks 2x10 yellow loop Dead bugs x10 Heel taps 2x10  Hooklying bil shoulder extension red band 2x10 Squats 10# 2x10  04/11/23: Patient consented to internal pelvic floor treatment vaginally this date and found to have continued tightness at lt deep pelvic floor layer and bil bulbocavernosus. Greatly improved from last session and pt reports no pain with palpation today, just reports noted tightness feeling. Pt reports improvement at end of session with gentle stretching. Pt tolerated well, no pain at end of session.  Pt reports she has been using a vibration wand to help with relaxation and stretching and thinks this is helping C-section manual work for mobility to decreased trunk flexion and improve posture and decreased strain at pelvic floor. Pt  tolerated well and denied pain   PATIENT EDUCATION:  Education details: Horticulturist, commercial Person educated: Patient Education method: Explanation, Demonstration, Tactile cues, Verbal cues, and Handouts Education comprehension: verbalized understanding and returned demonstration  HOME EXERCISE PROGRAM: [id number]  ASSESSMENT:  CLINICAL IMPRESSION: Patient presents for treatment, focus on internal vaginal pelvic floor manual work and once completed c-section manual work. Pt does continue to have tightness internally but improved greatly compared to previous internal treatment. Pt also reports feeling much less pain with palpation and gentle stretching. Pt tolerated well, also c-section scar moving well with minimal tension noted. Pt does continue to have increased sensitivity to various textures per pt and educated to continue introducing these textures slowly within tolerance to desensitize scar site. Pt agreed. Pt does report Pt would benefit from additional PT to further address deficits.    OBJECTIVE IMPAIRMENTS: decreased activity tolerance, decreased coordination, decreased endurance, decreased mobility, decreased strength, increased fascial restrictions, increased muscle spasms, impaired flexibility, impaired sensation, improper body mechanics, postural dysfunction, and pain.   ACTIVITY  LIMITATIONS: standing, squatting, locomotion level, and caring for others  PARTICIPATION LIMITATIONS: interpersonal relationship and community activity  PERSONAL FACTORS: Time since onset of injury/illness/exacerbation and 1 comorbidity: c-section   are also affecting patient's functional outcome.   REHAB POTENTIAL: Good  CLINICAL DECISION MAKING: Stable/uncomplicated  EVALUATION COMPLEXITY: Low   GOALS: Goals reviewed with patient? Yes  SHORT TERM GOALS: Target date: 02/26/23  Pt to be I with HEP.  Baseline: Goal status: INITIAL  2.  Pt to demonstrate at least 3/5 pelvic floor strength and ability  to hold contraction for at least 8 s for improved pelvic stability and decreased strain at pelvic floor/ decrease leakage.  Baseline:  Goal status: INITIAL  3.  Pt to demonstrate improved coordination of pelvic floor and breathing mechanics with 10# squat with appropriate synergistic patterns to decrease pain and leakage at least 50% of the time.    Baseline:  Goal status: INITIAL  4.  Pt to be I with pelvic wand use at home as needed for improved perineal body mobility and decreased tension and pain internally as needed.  Baseline:  Goal status: INITIAL   LONG TERM GOALS: Target date: 05/31/23  Pt to be I with advanced HEP.  Baseline:  Goal status: INITIAL  2.  Pt to demonstrate at least 4/5 pelvic floor strength for improved pelvic stability and decreased strain at pelvic floor/ decrease leakage.  Baseline:  Goal status: INITIAL  3.  Pt to demonstrate improved coordination of pelvic floor and breathing mechanics with 30# squat with appropriate synergistic patterns to decrease pain and leakage at least 75% of the time.    Baseline:  Goal status: INITIAL  4.  Pt to be I with scar mobility at home for decreased restrictions at c-section scar for improved mobility and activation at core muscles.  Baseline:  Goal status: INITIAL  5.  Pt to demonstrate no compensatory strategies with single leg Sit to stand x10 each leg due to improved pelvic stability for decreased pelvic floor overuse and tension. Baseline:  Goal status: INITIAL  6.  Pt will report 50% reduction of pain due to improvements in posture, strength, and muscle length for improved tolerance to vaginal penetration.  Baseline:  Goal status: INITIAL  PLAN:  PT FREQUENCY: 2x/week  PT DURATION:  12 sessions  PLANNED INTERVENTIONS: Therapeutic exercises, Therapeutic activity, Neuromuscular re-education, Balance training, Gait training, Patient/Family education, Self Care, Joint mobilization, Aquatic Therapy, Dry  Needling, Cryotherapy, Moist heat, scar mobilization, Taping, Biofeedback, Manual therapy, and Re-evaluation  PLAN FOR NEXT SESSION: gentle beginner deep core stabilizing/activation (postpartum 1-2 levels); posture strengthening; diaphragmatic breathing; scar mobility; internal pelvic floor mobility and strengthening; coordination of pelvic floor with breathing and exercises    Otelia Sergeant, PT, DPT 04/10/2409:45 AM

## 2023-04-16 ENCOUNTER — Encounter: Payer: Self-pay | Admitting: *Deleted

## 2023-04-16 ENCOUNTER — Other Ambulatory Visit: Payer: Self-pay | Admitting: *Deleted

## 2023-04-16 DIAGNOSIS — K6289 Other specified diseases of anus and rectum: Secondary | ICD-10-CM

## 2023-04-17 ENCOUNTER — Ambulatory Visit: Payer: Commercial Managed Care - PPO | Admitting: Physical Therapy

## 2023-04-17 DIAGNOSIS — M62838 Other muscle spasm: Secondary | ICD-10-CM

## 2023-04-17 DIAGNOSIS — R293 Abnormal posture: Secondary | ICD-10-CM

## 2023-04-17 DIAGNOSIS — M6281 Muscle weakness (generalized): Secondary | ICD-10-CM | POA: Diagnosis not present

## 2023-04-17 DIAGNOSIS — R279 Unspecified lack of coordination: Secondary | ICD-10-CM

## 2023-04-17 NOTE — Therapy (Signed)
OUTPATIENT PHYSICAL THERAPY FEMALE PELVIC TREATMENT   Patient Name: Monique Pruitt MRN: [id number] DOB:Sep 15, 1990, 32 y.o., female Today's Date: 04/17/2023  END OF SESSION:  PT End of Session - 04/17/23 0937     Visit Number 7    Date for PT Re-Evaluation 05/31/23    Authorization Type [employer]    PT Start Time 0932    PT Stop Time 1014    PT Time Calculation (min) 42 min    Activity Tolerance Patient tolerated treatment well    Behavior During Therapy [facility] for tasks assessed/performed              Past Medical History:  Diagnosis Date   Depression    Genital HSV    Migraine without aura 03/15/2021   Past Surgical History:  Procedure Laterality Date   CESAREAN SECTION N/A 12/14/2022   Procedure: CESAREAN SECTION;  Surgeon: Federico Flake, MD;  Location: [facility] LD ORS;  Service: Obstetrics;  Laterality: N/A;   WISDOM TOOTH EXTRACTION     Patient Active Problem List   Diagnosis Date Noted   History of C-section 12/14/2022   History of HSV 05/29/2022    PCP: not in chart  REFERRING PROVIDER: Milas Hock, MD   REFERRING DIAG: M62.89 (ICD-10-CM) - Pelvic floor dysfunction  THERAPY DIAG:  Muscle weakness (generalized)  Abnormal posture  Unspecified lack of coordination  Other muscle spasm  Rationale for Evaluation and Treatment: Rehabilitation  ONSET DATE: 12/14/22  SUBJECTIVE:                                                                                                                                                                                           SUBJECTIVE STATEMENT: Pain with intercourse is improving still present slightly but better. Pain with bowel movements still present but referral placed to GI from her MD and awaiting to be seen for that. Pt's goal is to have no pain with intercourse and stronger after c-section.   Fluid intake: Yes: water - 100oz daily    PAIN:  04/10/23  no  Are you having pain? Yes NPRS scale:  0-8/10 Pain location:  vagina  Pain type: sharp Pain description: intermittent   Aggravating factors: attempts at penetration, voiding bowel and bladder Relieving factors: not attempting penetration   PRECAUTIONS: Other: postpartum   RED FLAGS: None   WEIGHT BEARING RESTRICTIONS: No  FALLS:  Has patient fallen in last 6 months? No  LIVING ENVIRONMENT: Lives with: lives with their family Lives in: House/apartment   OCCUPATION: on maternity leave   PLOF: Independent  PATIENT GOALS: to feel stronger after c-section and to have less  tightness at pelvic floor   PERTINENT HISTORY:  C-section x1, History of HSV; Rh negative state in antepartum period; and Choroid plexus cyst   Sexual abuse: No  BOWEL MOVEMENT: Pain with bowel movement: No Type of bowel movement:Type (Bristol Stool Scale) 4, Frequency daily sometimes every other day, and Strain Yes Fully empty rectum: Yes:   Leakage: No Pads: No Fiber supplement: No  URINATION: Pain with urination: Yes - hard or painful to urinate sometimes Fully empty bladder: Yes:   Stream: Strong Urgency: No Frequency: 2-4 hours Leakage:  none Pads: No  INTERCOURSE: Pain with intercourse: Initial Penetration Ability to have vaginal penetration:  No Climax: not yet Marinoff Scale: 3/3  PREGNANCY: Vaginal deliveries 0  C-section deliveries 1 Currently pregnant No  PROLAPSE: None   OBJECTIVE:   DIAGNOSTIC FINDINGS:    COGNITION: Overall cognitive status: Within functional limits for tasks assessed     SENSATION: Light touch: Appears intact Proprioception: Appears intact  MUSCLE LENGTH: HS: mild tightness bil Piriformis:WNL  LUMBAR SPECIAL TESTS:  Single leg stance test: 10s both legs, but unstable with hip drop bil and decreased core activation  FUNCTIONAL TESTS:  Functional squat - descent limited by 25% with Lt knee pain and mild bil valgus with initial return to standing  GAIT: WFL  POSTURE:  rounded shoulders, increased thoracic kyphosis, and anterior pelvic tilt  PELVIC ALIGNMENT:  LUMBARAROM/PROM:  WFL, some tightness with left rotation 25% deficit, TTP Rt glute with flexion  LOWER EXTREMITY ROM:  WFL  LOWER EXTREMITY MMT:  Bil hips 4+/5; knees 5/5  PALPATION:   General  TTP in bil gluteals Rt>Lt, lumbar with tightness noted,                 External Perineal Exam TTP at bil bulbocavernosus, and perineal body                             Internal Pelvic Floor tension at perineal body in downward direction, Lt obturator internus, bil pubococcygeus with TTP in these areas as well   Patient confirms identification and approves PT to assess internal pelvic floor and treatment Yes No emotional/communication barriers or cognitive limitation. Patient is motivated to learn. Patient understands and agrees with treatment goals and plan. PT explains patient will be examined in standing, sitting, and lying down to see how their muscles and joints work. When they are ready, they will be asked to remove their underwear so PT can examine their perineum. The patient is also given the option of providing their own chaperone as one is not provided in our facility. The patient also has the right and is explained the right to defer or refuse any part of the evaluation or treatment including the internal exam. With the patient's consent, PT will use one gloved finger to gently assess the muscles of the pelvic floor, seeing how well it contracts and relaxes and if there is muscle symmetry. After, the patient will get dressed and PT and patient will discuss exam findings and plan of care. PT and patient discuss plan of care, schedule, attendance policy and HEP activities.  PELVIC MMT:   MMT eval 03/27/23   Vaginal 3/5; 4s; 3 reps 3/5  Internal Anal Sphincter    External Anal Sphincter    Puborectalis    Diastasis Recti  1 finger separation above and below umbilicus, mild density    (Blank  rows = not tested)  C-section scar - healing well, pink scar line but fully closed and no scabbing. decreased mobility at lateral Rt quadrant and decreased fascial restrictions here as well in surrounding tissue.  03/27/23 - still pink at scar but improved mobility in all directions - pt does report continued sensitivity but improving  TONE: WFL  PROLAPSE: Not seen in hooklying with cough  TODAY'S TREATMENT:                                                                                                                              DATE:   04/10/23: Fire hydrants 2x10 yellow loop Donkey kicks 2x10 yellow loop Dead bugs x10 Heel taps 2x10  Hooklying bil shoulder extension red band 2x10 Squats 10# 2x10  04/11/23: Patient consented to internal pelvic floor treatment vaginally this date and found to have continued tightness at lt deep pelvic floor layer and bil bulbocavernosus. Greatly improved from last session and pt reports no pain with palpation today, just reports noted tightness feeling. Pt reports improvement at end of session with gentle stretching. Pt tolerated well, no pain at end of session.  Pt reports she has been using a vibration wand to help with relaxation and stretching and thinks this is helping C-section manual work for mobility to decreased trunk flexion and improve posture and decreased strain at pelvic floor. Pt tolerated well and denied pain  04/17/23: Transverse abdominis in hooklying x20 Fire hydrants 2x10 red loop Donkey kicks 2x10 red loop Dead bugs x10 Heel taps 2x10  Hooklying bil shoulder extension red band 2x10 Cables 10# palloffs 2x10 each Bosu ball body weight squats 2x10 Lateral step ups bosu ball 2x10 Farmers carry 20# 1000' x2 switching hands  PATIENT EDUCATION:  Education details: Horticulturist, commercial Person educated: Patient Education method: Explanation, Demonstration, Tactile cues, Verbal cues, and Handouts Education comprehension: verbalized  understanding and returned demonstration  HOME EXERCISE PROGRAM: [id number]  ASSESSMENT:  CLINICAL IMPRESSION: Patient presents for treatment, focus on hip and core strengthening status post c-section. Pt tolerated well and demonstrated improved core activation with minimal cues but improving compared to previous sessions. Pt does continue to progress toward goals but demonstrated need for cues for techniques and core activation and intermittent pelvic floor pain and tightness.  Pt does report Pt would benefit from additional PT to further address deficits.    OBJECTIVE IMPAIRMENTS: decreased activity tolerance, decreased coordination, decreased endurance, decreased mobility, decreased strength, increased fascial restrictions, increased muscle spasms, impaired flexibility, impaired sensation, improper body mechanics, postural dysfunction, and pain.   ACTIVITY LIMITATIONS: standing, squatting, locomotion level, and caring for others  PARTICIPATION LIMITATIONS: interpersonal relationship and community activity  PERSONAL FACTORS: Time since onset of injury/illness/exacerbation and 1 comorbidity: c-section   are also affecting patient's functional outcome.   REHAB POTENTIAL: Good  CLINICAL DECISION MAKING: Stable/uncomplicated  EVALUATION COMPLEXITY: Low   GOALS: Goals reviewed with patient? Yes  SHORT TERM GOALS: Target date: 02/26/23  Pt to be I with HEP.  Baseline: Goal status: INITIAL  2.  Pt to demonstrate at least 3/5 pelvic floor strength and ability to hold contraction for at least 8 s for improved pelvic stability and decreased strain at pelvic floor/ decrease leakage.  Baseline:  Goal status: INITIAL  3.  Pt to demonstrate improved coordination of pelvic floor and breathing mechanics with 10# squat with appropriate synergistic patterns to decrease pain and leakage at least 50% of the time.    Baseline:  Goal status: INITIAL  4.  Pt to be I with pelvic wand use at home as  needed for improved perineal body mobility and decreased tension and pain internally as needed.  Baseline:  Goal status: INITIAL   LONG TERM GOALS: Target date: 05/31/23  Pt to be I with advanced HEP.  Baseline:  Goal status: INITIAL  2.  Pt to demonstrate at least 4/5 pelvic floor strength for improved pelvic stability and decreased strain at pelvic floor/ decrease leakage.  Baseline:  Goal status: INITIAL  3.  Pt to demonstrate improved coordination of pelvic floor and breathing mechanics with 30# squat with appropriate synergistic patterns to decrease pain and leakage at least 75% of the time.    Baseline:  Goal status: INITIAL  4.  Pt to be I with scar mobility at home for decreased restrictions at c-section scar for improved mobility and activation at core muscles.  Baseline:  Goal status: INITIAL  5.  Pt to demonstrate no compensatory strategies with single leg Sit to stand x10 each leg due to improved pelvic stability for decreased pelvic floor overuse and tension. Baseline:  Goal status: INITIAL  6.  Pt will report 50% reduction of pain due to improvements in posture, strength, and muscle length for improved tolerance to vaginal penetration.  Baseline:  Goal status: INITIAL  PLAN:  PT FREQUENCY: 2x/week  PT DURATION:  12 sessions  PLANNED INTERVENTIONS: Therapeutic exercises, Therapeutic activity, Neuromuscular re-education, Balance training, Gait training, Patient/Family education, Self Care, Joint mobilization, Aquatic Therapy, Dry Needling, Cryotherapy, Moist heat, scar mobilization, Taping, Biofeedback, Manual therapy, and Re-evaluation  PLAN FOR NEXT SESSION: gentle beginner deep core stabilizing/activation (postpartum 1-2 levels); posture strengthening; diaphragmatic breathing; scar mobility; internal pelvic floor mobility and strengthening; coordination of pelvic floor with breathing and exercises    Otelia Sergeant, PT, DPT 04/16/2409:22 AM

## 2023-04-19 ENCOUNTER — Ambulatory Visit: Payer: Commercial Managed Care - PPO | Admitting: Physical Therapy

## 2023-04-19 DIAGNOSIS — R293 Abnormal posture: Secondary | ICD-10-CM

## 2023-04-19 DIAGNOSIS — M6281 Muscle weakness (generalized): Secondary | ICD-10-CM

## 2023-04-19 DIAGNOSIS — R279 Unspecified lack of coordination: Secondary | ICD-10-CM

## 2023-04-19 NOTE — Therapy (Signed)
OUTPATIENT PHYSICAL THERAPY FEMALE PELVIC TREATMENT   Patient Name: Monique Pruitt MRN: [id number] DOB:1990-12-22, 32 y.o., female Today's Date: 04/19/2023  END OF SESSION:  PT End of Session - 04/19/23 1449     Visit Number 8    Date for PT Re-Evaluation 05/31/23    Authorization Type [employer]    PT Start Time 1400    PT Stop Time 1446    PT Time Calculation (min) 46 min    Activity Tolerance Patient tolerated treatment well    Behavior During Therapy [facility] for tasks assessed/performed               Past Medical History:  Diagnosis Date   Depression    Genital HSV    Migraine without aura 03/15/2021   Past Surgical History:  Procedure Laterality Date   CESAREAN SECTION N/A 12/14/2022   Procedure: CESAREAN SECTION;  Surgeon: Federico Flake, MD;  Location: [facility] LD ORS;  Service: Obstetrics;  Laterality: N/A;   WISDOM TOOTH EXTRACTION     Patient Active Problem List   Diagnosis Date Noted   History of C-section 12/14/2022   History of HSV 05/29/2022    PCP: not in chart  REFERRING PROVIDER: Milas Hock, MD   REFERRING DIAG: M62.89 (ICD-10-CM) - Pelvic floor dysfunction  THERAPY DIAG:  Muscle weakness (generalized)  Abnormal posture  Unspecified lack of coordination  Rationale for Evaluation and Treatment: Rehabilitation  ONSET DATE: 12/14/22  SUBJECTIVE:                                                                                                                                                                                           SUBJECTIVE STATEMENT: Overall feeling a lot better, bowels still the same.   Fluid intake: Yes: water - 100oz daily    PAIN:  04/10/23  no  Are you having pain? Yes NPRS scale: 0-8/10 Pain location:  vagina  Pain type: sharp Pain description: intermittent   Aggravating factors: attempts at penetration, voiding bowel and bladder Relieving factors: not attempting penetration   PRECAUTIONS: Other:  postpartum   RED FLAGS: None   WEIGHT BEARING RESTRICTIONS: No  FALLS:  Has patient fallen in last 6 months? No  LIVING ENVIRONMENT: Lives with: lives with their family Lives in: House/apartment   OCCUPATION: on maternity leave   PLOF: Independent  PATIENT GOALS: to feel stronger after c-section and to have less tightness at pelvic floor   PERTINENT HISTORY:  C-section x1, History of HSV; Rh negative state in antepartum period; and Choroid plexus cyst   Sexual abuse: No  BOWEL MOVEMENT: Pain with bowel movement: No Type  of bowel movement:Type (Bristol Stool Scale) 4, Frequency daily sometimes every other day, and Strain Yes Fully empty rectum: Yes:   Leakage: No Pads: No Fiber supplement: No  URINATION: Pain with urination: Yes - hard or painful to urinate sometimes Fully empty bladder: Yes:   Stream: Strong Urgency: No Frequency: 2-4 hours Leakage:  none Pads: No  INTERCOURSE: Pain with intercourse: Initial Penetration Ability to have vaginal penetration:  No Climax: not yet Marinoff Scale: 3/3  PREGNANCY: Vaginal deliveries 0  C-section deliveries 1 Currently pregnant No  PROLAPSE: None   OBJECTIVE:   DIAGNOSTIC FINDINGS:    COGNITION: Overall cognitive status: Within functional limits for tasks assessed     SENSATION: Light touch: Appears intact Proprioception: Appears intact  MUSCLE LENGTH: HS: mild tightness bil Piriformis:WNL  LUMBAR SPECIAL TESTS:  Single leg stance test: 10s both legs, but unstable with hip drop bil and decreased core activation  FUNCTIONAL TESTS:  Functional squat - descent limited by 25% with Lt knee pain and mild bil valgus with initial return to standing  GAIT: WFL  POSTURE: rounded shoulders, increased thoracic kyphosis, and anterior pelvic tilt  PELVIC ALIGNMENT:  LUMBARAROM/PROM:  WFL, some tightness with left rotation 25% deficit, TTP Rt glute with flexion  LOWER EXTREMITY ROM:  WFL  LOWER  EXTREMITY MMT:  Bil hips 4+/5; knees 5/5  PALPATION:   General  TTP in bil gluteals Rt>Lt, lumbar with tightness noted,                 External Perineal Exam TTP at bil bulbocavernosus, and perineal body                             Internal Pelvic Floor tension at perineal body in downward direction, Lt obturator internus, bil pubococcygeus with TTP in these areas as well   Patient confirms identification and approves PT to assess internal pelvic floor and treatment Yes No emotional/communication barriers or cognitive limitation. Patient is motivated to learn. Patient understands and agrees with treatment goals and plan. PT explains patient will be examined in standing, sitting, and lying down to see how their muscles and joints work. When they are ready, they will be asked to remove their underwear so PT can examine their perineum. The patient is also given the option of providing their own chaperone as one is not provided in our facility. The patient also has the right and is explained the right to defer or refuse any part of the evaluation or treatment including the internal exam. With the patient's consent, PT will use one gloved finger to gently assess the muscles of the pelvic floor, seeing how well it contracts and relaxes and if there is muscle symmetry. After, the patient will get dressed and PT and patient will discuss exam findings and plan of care. PT and patient discuss plan of care, schedule, attendance policy and HEP activities.  PELVIC MMT:   MMT eval 03/27/23  04/19/23   Vaginal 3/5; 4s; 3 reps 3/5 4/5, 10s, 5 reps  Internal Anal Sphincter     External Anal Sphincter     Puborectalis     Diastasis Recti  1 finger separation above and below umbilicus, mild density     (Blank rows = not tested)       C-section scar - healing well, pink scar line but fully closed and no scabbing. decreased mobility at lateral Rt quadrant and decreased fascial restrictions here as  well in  surrounding tissue.  03/27/23 - still pink at scar but improved mobility in all directions - pt does report continued sensitivity but improving  TONE: WFL  PROLAPSE: Not seen in hooklying with cough  TODAY'S TREATMENT:                                                                                                                              DATE:   04/11/23: Patient consented to internal pelvic floor treatment vaginally this date and found to have continued tightness at lt deep pelvic floor layer and bil bulbocavernosus. Greatly improved from last session and pt reports no pain with palpation today, just reports noted tightness feeling. Pt reports improvement at end of session with gentle stretching. Pt tolerated well, no pain at end of session.  Pt reports she has been using a vibration wand to help with relaxation and stretching and thinks this is helping C-section manual work for mobility to decreased trunk flexion and improve posture and decreased strain at pelvic floor. Pt tolerated well and denied pain  04/17/23: Transverse abdominis in hooklying x20 Fire hydrants 2x10 red loop Donkey kicks 2x10 red loop Dead bugs x10 Heel taps 2x10  Hooklying bil shoulder extension red band 2x10 Cables 10# palloffs 2x10 each Bosu ball body weight squats 2x10 Lateral step ups bosu ball 2x10 Farmers carry 20# 1000' x2 switching hands  04/19/23  Patient consented to internal pelvic floor treatment vaginally this date and found to have improvement in strength, endurance, and coordination. Findings above. No pain or increased tension throughout pelvic floor. Greatly improved. X10 pelvic floor contractions completed, x10 quick flicks, x10 isometrics for 10s able to complete x5 then decreased to 8s for final 5 reps X20 transverse abdominis activations - one cue for technique Hooklying bil shoulder extension with alt knee to chest green band 2x10 Sidelying reverse clam green band 2x10 Hip thrust  x10 Single leg Sit to stand from mat table with kick stance leg x10 Reverse chops green band for anti rotation x10 each Chops with anti rotation green band x10 each Thoracic opener in quad x10 each  PATIENT EDUCATION:  Education details: Horticulturist, commercial Person educated: Patient Education method: Programmer, multimedia, Demonstration, Actor cues, Verbal cues, and Handouts Education comprehension: verbalized understanding and returned demonstration  HOME EXERCISE PROGRAM: [id number]  ASSESSMENT:  CLINICAL IMPRESSION: Patient presents for treatment, focus on hip and core strengthening status post c-section. Pt tolerated well and demonstrated improved core activation with minimal cues but improving compared to previous sessions. Pt does continue to progress toward goals but demonstrated need for cues for techniques and core activation and intermittent pelvic floor pain and tightness.  Pt also consented to internal vaginal pelvic floor re-checking today and found to have great improvement. Pt does report Pt would benefit from additional PT to further address deficits.    OBJECTIVE IMPAIRMENTS: decreased activity tolerance, decreased coordination, decreased endurance, decreased mobility, decreased strength, increased fascial restrictions, increased muscle spasms,  impaired flexibility, impaired sensation, improper body mechanics, postural dysfunction, and pain.   ACTIVITY LIMITATIONS: standing, squatting, locomotion level, and caring for others  PARTICIPATION LIMITATIONS: interpersonal relationship and community activity  PERSONAL FACTORS: Time since onset of injury/illness/exacerbation and 1 comorbidity: c-section   are also affecting patient's functional outcome.   REHAB POTENTIAL: Good  CLINICAL DECISION MAKING: Stable/uncomplicated  EVALUATION COMPLEXITY: Low   GOALS: Goals reviewed with patient? Yes  SHORT TERM GOALS: Target date: 02/26/23  Pt to be I with HEP.  Baseline: Goal status: MET  2.   Pt to demonstrate at least 3/5 pelvic floor strength and ability to hold contraction for at least 8 s for improved pelvic stability and decreased strain at pelvic floor/ decrease leakage.  Baseline:  Goal status: MET  3.  Pt to demonstrate improved coordination of pelvic floor and breathing mechanics with 10# squat with appropriate synergistic patterns to decrease pain and leakage at least 50% of the time.    Baseline:  Goal status: MET  4.  Pt to be I with pelvic wand use at home as needed for improved perineal body mobility and decreased tension and pain internally as needed.  Baseline:  Goal status: MET   LONG TERM GOALS: Target date: 05/31/23  Pt to be I with advanced HEP.  Baseline:  Goal status: MET  2.  Pt to demonstrate at least 4/5 pelvic floor strength for improved pelvic stability and decreased strain at pelvic floor/ decrease leakage.  Baseline:  Goal status: MET  3.  Pt to demonstrate improved coordination of pelvic floor and breathing mechanics with 30# squat with appropriate synergistic patterns to decrease pain and leakage at least 75% of the time.    Baseline:  Goal status: on going  4.  Pt to be I with scar mobility at home for decreased restrictions at c-section scar for improved mobility and activation at core muscles.  Baseline:  Goal status: MET  5.  Pt to demonstrate no compensatory strategies with single leg Sit to stand x10 each leg due to improved pelvic stability for decreased pelvic floor overuse and tension. Baseline:  Goal status: on going  6.  Pt will report 50% reduction of pain due to improvements in posture, strength, and muscle length for improved tolerance to vaginal penetration.  Baseline:  Goal status: MET  PLAN:  PT FREQUENCY: 2x/week  PT DURATION:  12 sessions  PLANNED INTERVENTIONS: Therapeutic exercises, Therapeutic activity, Neuromuscular re-education, Balance training, Gait training, Patient/Family education, Self Care, Joint  mobilization, Aquatic Therapy, Dry Needling, Cryotherapy, Moist heat, scar mobilization, Taping, Biofeedback, Manual therapy, and Re-evaluation  PLAN FOR NEXT SESSION: posture strengthening; diaphragmatic breathing; scar mobility; internal pelvic floor mobility and strengthening; coordination of pelvic floor with breathing and exercises    Otelia Sergeant, PT, DPT 12/12/242:49 PM

## 2023-04-25 ENCOUNTER — Ambulatory Visit: Payer: Commercial Managed Care - PPO | Admitting: Physical Therapy

## 2023-04-25 DIAGNOSIS — M6281 Muscle weakness (generalized): Secondary | ICD-10-CM

## 2023-04-25 DIAGNOSIS — R293 Abnormal posture: Secondary | ICD-10-CM

## 2023-04-25 NOTE — Therapy (Signed)
OUTPATIENT PHYSICAL THERAPY FEMALE PELVIC TREATMENT   Patient Name: Monique Pruitt MRN: [id number] DOB:1990-12-27, 32 y.o., female Today's Date: 04/25/2023  END OF SESSION:  PT End of Session - 04/25/23 0844     Visit Number 9    Date for PT Re-Evaluation 05/31/23    Authorization Type [employer]    PT Start Time 0845    PT Stop Time 0930    PT Time Calculation (min) 45 min    Activity Tolerance Patient tolerated treatment well    Behavior During Therapy [facility] for tasks assessed/performed               Past Medical History:  Diagnosis Date   Depression    Genital HSV    Migraine without aura 03/15/2021   Past Surgical History:  Procedure Laterality Date   CESAREAN SECTION N/A 12/14/2022   Procedure: CESAREAN SECTION;  Surgeon: Federico Flake, MD;  Location: [facility] LD ORS;  Service: Obstetrics;  Laterality: N/A;   WISDOM TOOTH EXTRACTION     Patient Active Problem List   Diagnosis Date Noted   History of C-section 12/14/2022   History of HSV 05/29/2022    PCP: not in chart  REFERRING PROVIDER: Milas Hock, MD   REFERRING DIAG: M62.89 (ICD-10-CM) - Pelvic floor dysfunction  THERAPY DIAG:  Muscle weakness (generalized)  Abnormal posture  Rationale for Evaluation and Treatment: Rehabilitation  ONSET DATE: 12/14/22  SUBJECTIVE:                                                                                                                                                                                           SUBJECTIVE STATEMENT: Has been doing much better, started taking mirilax to help with stools and this has been much less painful.   Fluid intake: Yes: water - 100oz daily    PAIN:  04/25/23  no  Are you having pain? Yes NPRS scale: 0-8/10 Pain location:  vagina  Pain type: sharp Pain description: intermittent   Aggravating factors: attempts at penetration, voiding bowel and bladder Relieving factors: not attempting penetration    PRECAUTIONS: Other: postpartum   RED FLAGS: None   WEIGHT BEARING RESTRICTIONS: No  FALLS:  Has patient fallen in last 6 months? No  LIVING ENVIRONMENT: Lives with: lives with their family Lives in: House/apartment   OCCUPATION: on maternity leave   PLOF: Independent  PATIENT GOALS: to feel stronger after c-section and to have less tightness at pelvic floor   PERTINENT HISTORY:  C-section x1, History of HSV; Rh negative state in antepartum period; and Choroid plexus cyst   Sexual abuse: No  BOWEL MOVEMENT: Pain  with bowel movement: No Type of bowel movement:Type (Bristol Stool Scale) 4, Frequency daily sometimes every other day, and Strain Yes Fully empty rectum: Yes:   Leakage: No Pads: No Fiber supplement: No  URINATION: Pain with urination: Yes - hard or painful to urinate sometimes Fully empty bladder: Yes:   Stream: Strong Urgency: No Frequency: 2-4 hours Leakage:  none Pads: No  INTERCOURSE: Pain with intercourse: Initial Penetration Ability to have vaginal penetration:  No Climax: not yet Marinoff Scale: 3/3  PREGNANCY: Vaginal deliveries 0  C-section deliveries 1 Currently pregnant No  PROLAPSE: None   OBJECTIVE:   DIAGNOSTIC FINDINGS:    COGNITION: Overall cognitive status: Within functional limits for tasks assessed     SENSATION: Light touch: Appears intact Proprioception: Appears intact  MUSCLE LENGTH: HS: mild tightness bil Piriformis:WNL  LUMBAR SPECIAL TESTS:  Single leg stance test: 10s both legs, but unstable with hip drop bil and decreased core activation  FUNCTIONAL TESTS:  Functional squat - descent limited by 25% with Lt knee pain and mild bil valgus with initial return to standing  GAIT: WFL  POSTURE: rounded shoulders, increased thoracic kyphosis, and anterior pelvic tilt  PELVIC ALIGNMENT:  LUMBARAROM/PROM:  WFL, some tightness with left rotation 25% deficit, TTP Rt glute with flexion  LOWER  EXTREMITY ROM:  WFL  LOWER EXTREMITY MMT:  Bil hips 4+/5; knees 5/5  PALPATION:   General  TTP in bil gluteals Rt>Lt, lumbar with tightness noted,                 External Perineal Exam TTP at bil bulbocavernosus, and perineal body                             Internal Pelvic Floor tension at perineal body in downward direction, Lt obturator internus, bil pubococcygeus with TTP in these areas as well   Patient confirms identification and approves PT to assess internal pelvic floor and treatment Yes No emotional/communication barriers or cognitive limitation. Patient is motivated to learn. Patient understands and agrees with treatment goals and plan. PT explains patient will be examined in standing, sitting, and lying down to see how their muscles and joints work. When they are ready, they will be asked to remove their underwear so PT can examine their perineum. The patient is also given the option of providing their own chaperone as one is not provided in our facility. The patient also has the right and is explained the right to defer or refuse any part of the evaluation or treatment including the internal exam. With the patient's consent, PT will use one gloved finger to gently assess the muscles of the pelvic floor, seeing how well it contracts and relaxes and if there is muscle symmetry. After, the patient will get dressed and PT and patient will discuss exam findings and plan of care. PT and patient discuss plan of care, schedule, attendance policy and HEP activities.  PELVIC MMT:   MMT eval 03/27/23  04/19/23   Vaginal 3/5; 4s; 3 reps 3/5 4/5, 10s, 5 reps  Internal Anal Sphincter     External Anal Sphincter     Puborectalis     Diastasis Recti  1 finger separation above and below umbilicus, mild density     (Blank rows = not tested)       C-section scar - healing well, pink scar line but fully closed and no scabbing. decreased mobility at lateral Rt quadrant and  decreased fascial  restrictions here as well in surrounding tissue.  03/27/23 - still pink at scar but improved mobility in all directions - pt does report continued sensitivity but improving  TONE: WFL  PROLAPSE: Not seen in hooklying with cough  TODAY'S TREATMENT:                                                                                                                              DATE:   04/17/23: Transverse abdominis in hooklying x20 Fire hydrants 2x10 red loop Donkey kicks 2x10 red loop Dead bugs x10 Heel taps 2x10  Hooklying bil shoulder extension red band 2x10 Cables 10# palloffs 2x10 each Bosu ball body weight squats 2x10 Lateral step ups bosu ball 2x10 Farmers carry 20# 1000' x2 switching hands  04/19/23  Patient consented to internal pelvic floor treatment vaginally this date and found to have improvement in strength, endurance, and coordination. Findings above. No pain or increased tension throughout pelvic floor. Greatly improved. X10 pelvic floor contractions completed, x10 quick flicks, x10 isometrics for 10s able to complete x5 then decreased to 8s for final 5 reps X20 transverse abdominis activations - one cue for technique Hooklying bil shoulder extension with alt knee to chest green band 2x10 Sidelying reverse clam green band 2x10 Hip thrust x10 Single leg Sit to stand from mat table with kick stance leg x10 Reverse chops green band for anti rotation x10 each Chops with anti rotation green band x10 each Thoracic opener in quad x10 each  04/25/23:  2x10 single leg Sit to stand   from lowest setting mat table X10 squats 30# Farmers carry 30# decreased to 20# after 300' then additional 700' with 20#, 1000' 20# in Lt hand Reverse chop green band x10 Chop green band x10  Answered all questions about HEP and goals  PATIENT EDUCATION:  Education details: Horticulturist, commercial Person educated: Patient Education method: Explanation, Demonstration, Tactile cues, Verbal cues, and  Handouts Education comprehension: verbalized understanding and returned demonstration  HOME EXERCISE PROGRAM: [id number]  ASSESSMENT:  CLINICAL IMPRESSION: Patient presents for treatment, focus on reviewing goals, pt concerns/questions, and strengthening of hips and core. Pt tolerated well demonstrated good techniques throughout. Did address her questions on making sure she is completing chops and reverse chop exercises correctly for home and she was doing them correctly. Pt denied pain, reports she is comfortable with DC today and all goals have been met. Pt is planning to follow up with GI, appt is scheduled. However has been seeing improvement with bowels and pain. Pt understands she will need new referral any future PT needs.   OBJECTIVE IMPAIRMENTS: decreased activity tolerance, decreased coordination, decreased endurance, decreased mobility, decreased strength, increased fascial restrictions, increased muscle spasms, impaired flexibility, impaired sensation, improper body mechanics, postural dysfunction, and pain.   ACTIVITY LIMITATIONS: standing, squatting, locomotion level, and caring for others  PARTICIPATION LIMITATIONS: interpersonal relationship and community activity  PERSONAL FACTORS: Time since onset of injury/illness/exacerbation and 1  comorbidity: c-section   are also affecting patient's functional outcome.   REHAB POTENTIAL: Good  CLINICAL DECISION MAKING: Stable/uncomplicated  EVALUATION COMPLEXITY: Low   GOALS: Goals reviewed with patient? Yes  SHORT TERM GOALS: Target date: 02/26/23  Pt to be I with HEP.  Baseline: Goal status: MET  2.  Pt to demonstrate at least 3/5 pelvic floor strength and ability to hold contraction for at least 8 s for improved pelvic stability and decreased strain at pelvic floor/ decrease leakage.  Baseline:  Goal status: MET  3.  Pt to demonstrate improved coordination of pelvic floor and breathing mechanics with 10# squat with  appropriate synergistic patterns to decrease pain and leakage at least 50% of the time.    Baseline:  Goal status: MET  4.  Pt to be I with pelvic wand use at home as needed for improved perineal body mobility and decreased tension and pain internally as needed.  Baseline:  Goal status: MET   LONG TERM GOALS: Target date: 05/31/23  Pt to be I with advanced HEP.  Baseline:  Goal status: MET  2.  Pt to demonstrate at least 4/5 pelvic floor strength for improved pelvic stability and decreased strain at pelvic floor/ decrease leakage.  Baseline:  Goal status: MET  3.  Pt to demonstrate improved coordination of pelvic floor and breathing mechanics with 30# squat with appropriate synergistic patterns to decrease pain and leakage at least 75% of the time.    Baseline:  Goal status: MET  4.  Pt to be I with scar mobility at home for decreased restrictions at c-section scar for improved mobility and activation at core muscles.  Baseline:  Goal status: MET  5.  Pt to demonstrate no compensatory strategies with single leg Sit to stand x10 each leg due to improved pelvic stability for decreased pelvic floor overuse and tension. Baseline:  Goal status: MET  6.  Pt will report 50% reduction of pain due to improvements in posture, strength, and muscle length for improved tolerance to vaginal penetration.  Baseline:  Goal status: MET  PLAN:  PT FREQUENCY: 2x/week  PT DURATION:  12 sessions  PLANNED INTERVENTIONS: Therapeutic exercises, Therapeutic activity, Neuromuscular re-education, Balance training, Gait training, Patient/Family education, Self Care, Joint mobilization, Aquatic Therapy, Dry Needling, Cryotherapy, Moist heat, scar mobilization, Taping, Biofeedback, Manual therapy, and Re-evaluation  PLAN FOR NEXT SESSION:   PHYSICAL THERAPY DISCHARGE SUMMARY  Visits from Start of Care: 9  Current functional level related to goals / functional outcomes: All goals met   Remaining  deficits: All goals met   Education / Equipment: HEP   Patient agrees to discharge. Patient goals were met. Patient is being discharged due to meeting the stated rehab goals.    Otelia Sergeant, PT, DPT 04/24/2409:27 AM

## 2023-09-03 IMAGING — US US PELVIS COMPLETE WITH TRANSVAGINAL
2 series · 13 of 25 positions shown · non-contrast
Comparison: None Available.

CLINICAL DATA: Dysmenorrhea, heavy painful menses for years, LEFT
pelvic pain during penetration for 1 month, LMP 09/09/2021

EXAM:
TRANSABDOMINAL AND TRANSVAGINAL ULTRASOUND OF PELVIS
TECHNIQUE: Both transabdominal and transvaginal ultrasound examinations of the
pelvis were performed. Transabdominal technique was performed for
global imaging of the pelvis including uterus, ovaries, adnexal
regions, and pelvic cul-de-sac. It was necessary to proceed with
endovaginal exam following the transabdominal exam to visualize the
endometrium and LEFT ovary.

[Series 1: us pelvic complete with transvaginal · 12 of 89 slices shown]
[im 1/89]
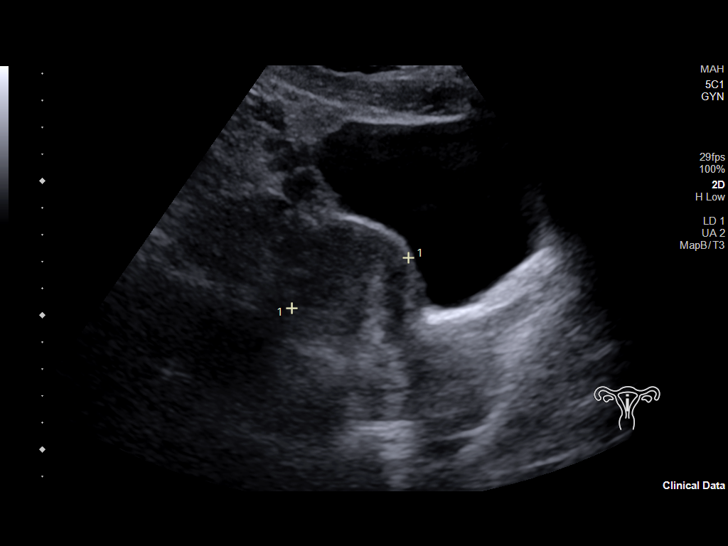
[im 8/89]
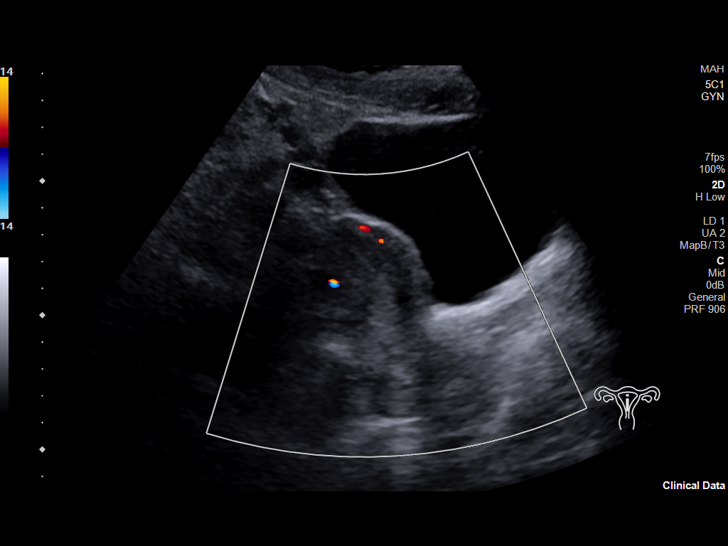
[im 16/89]
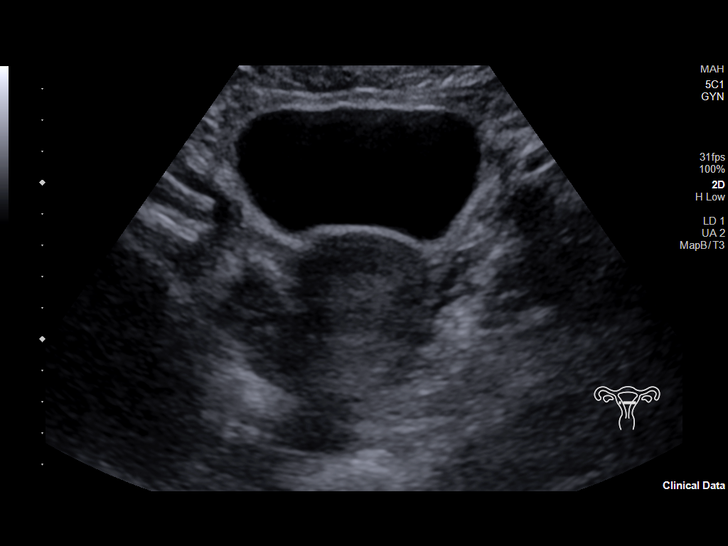
[im 23/89]
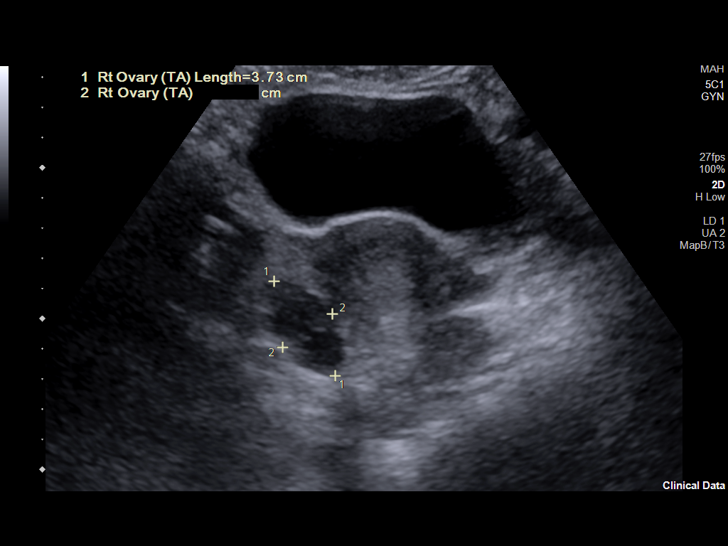
[im 31/89]
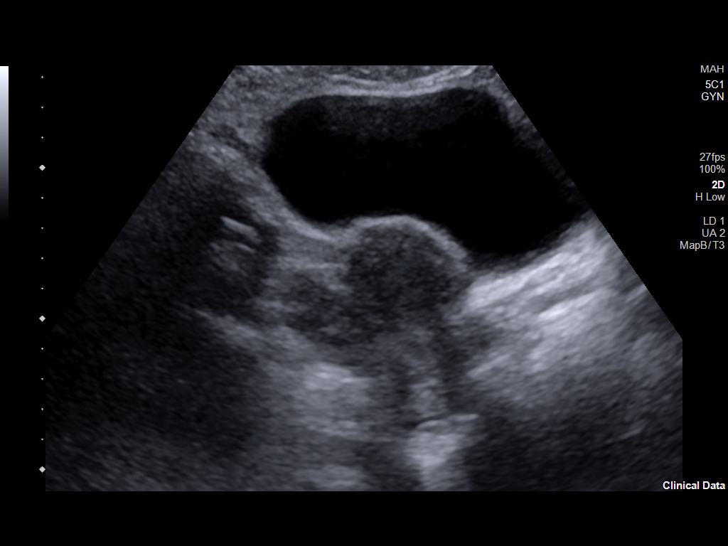
[im 39/89]
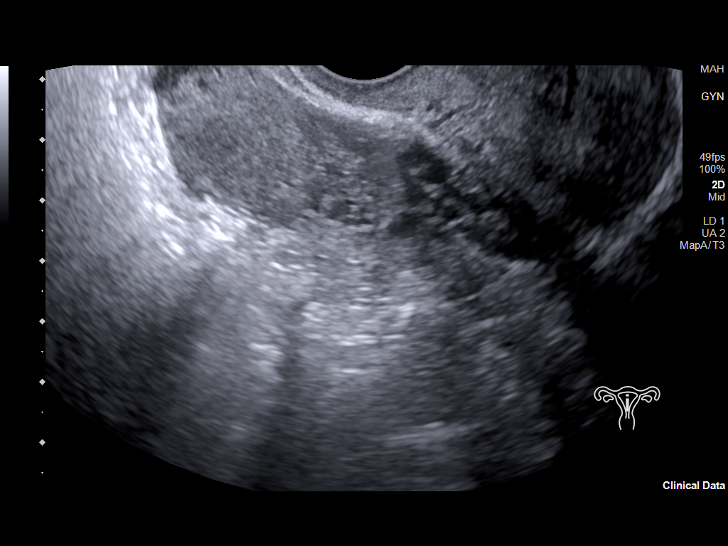
[im 46/89]
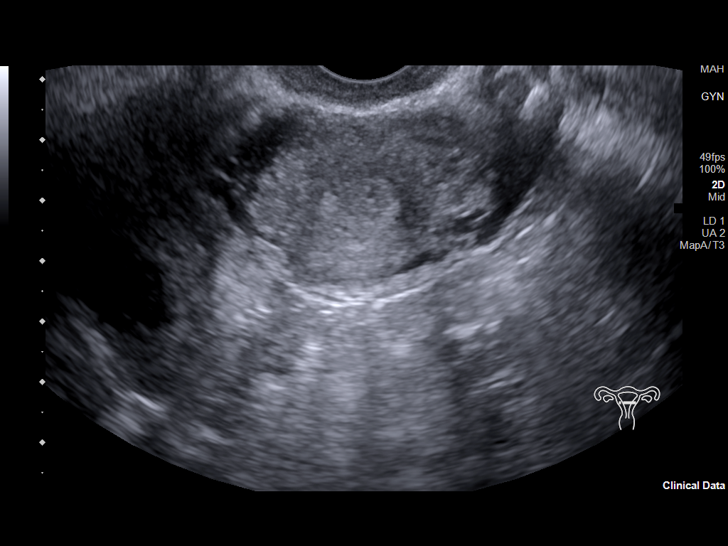
[im 54/89]
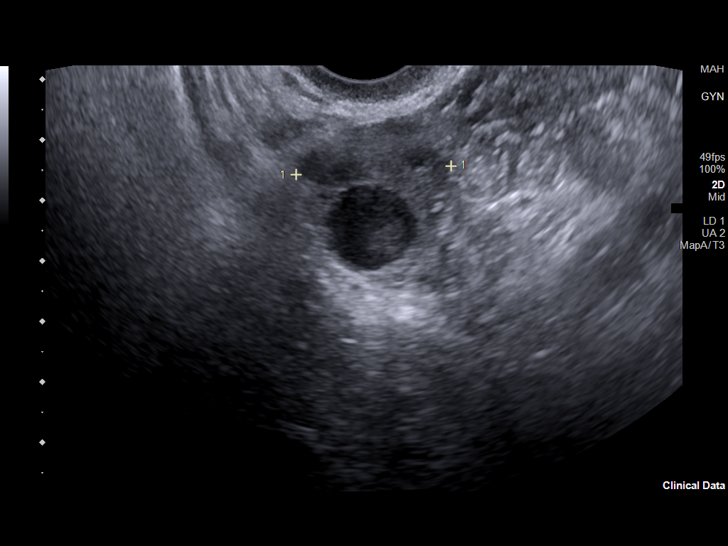
[im 62/89]
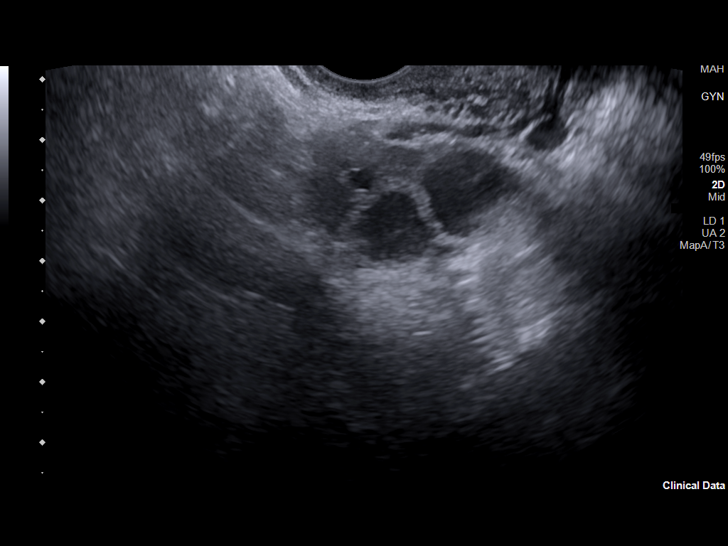
[im 69/89]
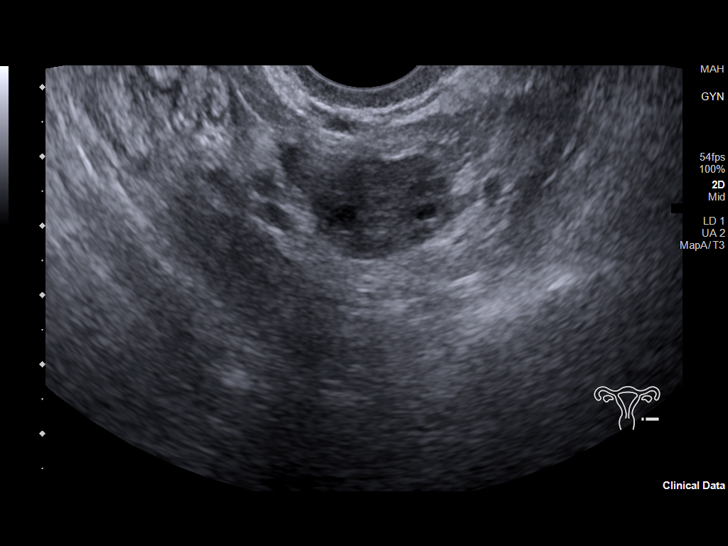
[im 77/89]
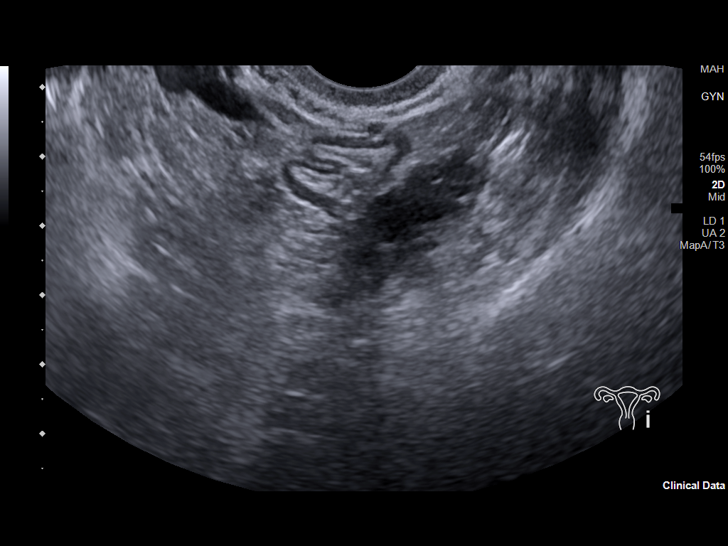
[im 85/89]
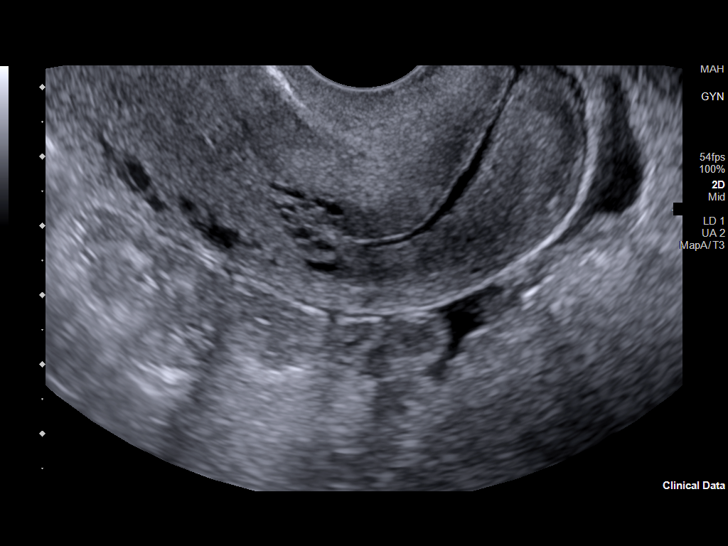

[Series 1001: gyn · 1 of 1 slices shown]
[im 1/1]
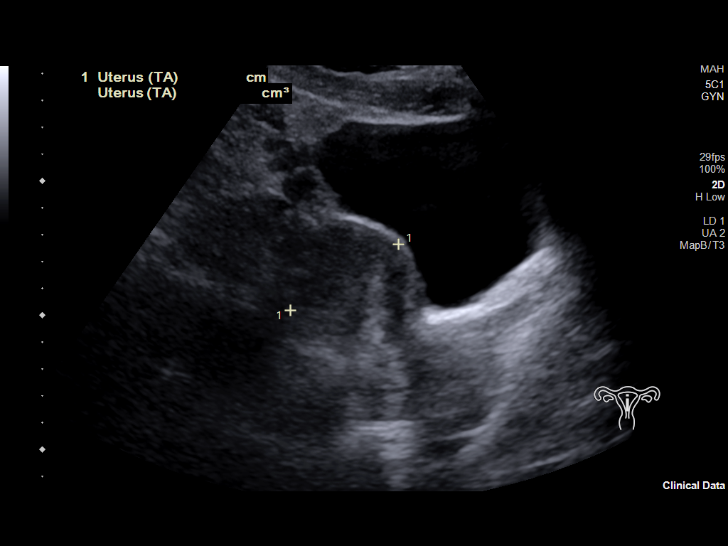

[13 of 25 positions shown; findings below may reference images not displayed]

FINDINGS: Uterus

Measurements: 9.5 x 4.7 x 4.4 cm = volume: 103 mL. Anteverted.
Normal morphology without mass

Endometrium

Thickness: 11 mm. No endometrial fluid or mass. Trace nonspecific
fluid within endocervical canal.

Right ovary

Measurements: 4.1 x 3.0 x 2.6 cm = volume: 16.4 mL. Normal
morphology without mass. Small hemorrhagic corpus luteum identified;
no follow-up imaging recommended.

Left ovary

Measurements: 3.6 x 1.3 x 2.0 cm = volume: 5.1 mL. Normal morphology
without mass

Other findings

Trace nonspecific free pelvic fluid.  No adnexal masses.
IMPRESSION: No significant pelvic sonographic abnormalities.

## 2023-12-22 ENCOUNTER — Other Ambulatory Visit: Payer: Self-pay | Admitting: Obstetrics and Gynecology

## 2023-12-27 ENCOUNTER — Other Ambulatory Visit: Payer: Self-pay

## 2023-12-27 MED ORDER — NORETHINDRONE 0.35 MG PO TABS: 1.0000 | ORAL_TABLET | Freq: Every day | ORAL | 3 refills | Status: DC

## 2024-04-01 ENCOUNTER — Other Ambulatory Visit (HOSPITAL_COMMUNITY)
Admission: RE | Admit: 2024-04-01 | Discharge: 2024-04-01 | Disposition: A | Discharge status: Home / Self Care | Source: Ambulatory Visit | Attending: Obstetrics and Gynecology | Admitting: Obstetrics and Gynecology

## 2024-04-01 ENCOUNTER — Ambulatory Visit (INDEPENDENT_AMBULATORY_CARE_PROVIDER_SITE_OTHER)

## 2024-04-01 VITALS — BP 105/75 | HR 89 | Ht 64.0 in | Wt 153.0 lb

## 2024-04-01 DIAGNOSIS — N926 Irregular menstruation, unspecified: Secondary | ICD-10-CM | POA: Diagnosis not present

## 2024-04-01 DIAGNOSIS — Z3A10 10 weeks gestation of pregnancy: Secondary | ICD-10-CM | POA: Diagnosis not present

## 2024-04-01 DIAGNOSIS — Z3201 Encounter for pregnancy test, result positive: Secondary | ICD-10-CM | POA: Diagnosis not present

## 2024-04-01 DIAGNOSIS — Z3401 Encounter for supervision of normal first pregnancy, first trimester: Secondary | ICD-10-CM | POA: Diagnosis present

## 2024-04-01 LAB — POCT URINE PREGNANCY: Preg Test, Ur: POSITIVE — AB

## 2024-04-01 NOTE — Progress Notes (Signed)
 Monique Pruitt here for a UPT. Pt had a positive upt at home. First positive test at home on 03/21/24. LMP is estimated at 01/22/2024.  Pt reported had 1.5 days in early October of spotting.    UPT in office Positive.    Reviewed medications and informed to start a PNV, if not already. Pt to follow up in 2 weeks for New OB visit. Pt reported transferring care to [facility] Intake  I explained I am completing New OB Intake today. We discussed EDD of 10/28/2024, by Last Menstrual Period. Pt is G2P1001. I reviewed her allergies, medications and Medical/Surgical/OB history.    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   Last Pap Diagnosis  Date Value Ref Range Status  10/10/2021   Final   - Negative for intraepithelial lesion or malignancy (NILM)    First visit review Pt reported in process of moving to [place] and will be transferring care to [facility], would like to have dating/viability ultrasound completed prior to transfer.    Monique Pruitt, [place] 04/01/2024  9:19 AM

## 2024-04-02 LAB — CERVICOVAGINAL ANCILLARY ONLY
Chlamydia: NEGATIVE
Comment: NEGATIVE
Comment: NORMAL
Neisseria Gonorrhea: NEGATIVE

## 2024-04-03 LAB — CULTURE, OB URINE

## 2024-04-03 LAB — URINE CULTURE, OB REFLEX

## 2024-04-08 ENCOUNTER — Other Ambulatory Visit: Payer: Self-pay

## 2024-04-08 ENCOUNTER — Other Ambulatory Visit

## 2024-04-08 ENCOUNTER — Other Ambulatory Visit: Payer: Self-pay | Admitting: Obstetrics and Gynecology

## 2024-04-08 DIAGNOSIS — Z3A10 10 weeks gestation of pregnancy: Secondary | ICD-10-CM | POA: Diagnosis not present

## 2024-04-08 DIAGNOSIS — N926 Irregular menstruation, unspecified: Secondary | ICD-10-CM

## 2024-04-08 DIAGNOSIS — Z3491 Encounter for supervision of normal pregnancy, unspecified, first trimester: Secondary | ICD-10-CM | POA: Diagnosis not present

## 2024-04-21 ENCOUNTER — Ambulatory Visit (HOSPITAL_BASED_OUTPATIENT_CLINIC_OR_DEPARTMENT_OTHER): Admission: RE | Admit: 2024-04-21 | Source: Ambulatory Visit

## 2024-04-21 ENCOUNTER — Telehealth: Payer: Self-pay

## 2024-04-21 DIAGNOSIS — O209 Hemorrhage in early pregnancy, unspecified: Secondary | ICD-10-CM

## 2024-04-21 NOTE — Addendum Note (Signed)
 Addended by: ORLINDA SILVANO ORN on: 04/21/2024 11:52 AM   Modules accepted: Orders

## 2024-04-21 NOTE — Telephone Encounter (Addendum)
 Pt reported starting having light pink bleeding last week on Sunday, then Tuesday (had intercourse) changed to a brown color then by Friday dark red, on Friday night also saw small clots, wore a pad Friday evening, Saturday and Sunday. Pt reported was not a ton of blood. Pt reported yesterday evening, saw a long flat clear strip, unsure if fetal tissue, after that bleeding has resolved. Pt reported now just seeing very light brown. RN reviewed with provider Dr. Leggett who gave orders for ultrasound.   Silvano LELON Piano, RN

## 2024-04-22 ENCOUNTER — Ambulatory Visit (HOSPITAL_BASED_OUTPATIENT_CLINIC_OR_DEPARTMENT_OTHER)
Admission: RE | Admit: 2024-04-22 | Discharge: 2024-04-22 | Attending: Obstetrics & Gynecology | Admitting: Obstetrics & Gynecology

## 2024-04-22 ENCOUNTER — Telehealth: Payer: Self-pay

## 2024-04-22 DIAGNOSIS — O209 Hemorrhage in early pregnancy, unspecified: Secondary | ICD-10-CM

## 2024-04-22 NOTE — Telephone Encounter (Signed)
 RN spoke with patient per request of the provider and reviewed results and when to follow up. RN reviewed signs and symptoms to report. Pt verbalized understanding. Pt reported hesitant to make new OB appointment as will be changing practices due to moving. RN offered to schedule appointment after first of the year if patient not already established with another provider, Pt agreeable and reported would let us  know.  Silvano LELON Piano, RN

## 2024-04-25 ENCOUNTER — Encounter: Payer: Self-pay | Admitting: Obstetrics & Gynecology

## 2024-05-21 ENCOUNTER — Telehealth: Payer: Self-pay | Admitting: *Deleted

## 2024-05-21 NOTE — Telephone Encounter (Signed)
 Left patient a message to call or MyChart message the office if she has established care with another office or not per [facility], [place].

## 2024-06-11 ENCOUNTER — Ambulatory Visit: Admitting: Obstetrics and Gynecology

## 2024-06-11 VITALS — BP 119/76 | HR 102 | Wt 157.0 lb

## 2024-06-11 DIAGNOSIS — Z3480 Encounter for supervision of other normal pregnancy, unspecified trimester: Secondary | ICD-10-CM

## 2024-06-11 DIAGNOSIS — Z98891 History of uterine scar from previous surgery: Secondary | ICD-10-CM

## 2024-06-11 DIAGNOSIS — Z6791 Unspecified blood type, Rh negative: Secondary | ICD-10-CM

## 2024-06-11 DIAGNOSIS — Z8619 Personal history of other infectious and parasitic diseases: Secondary | ICD-10-CM

## 2024-06-11 DIAGNOSIS — Z3A15 15 weeks gestation of pregnancy: Secondary | ICD-10-CM

## 2024-06-11 DIAGNOSIS — Z131 Encounter for screening for diabetes mellitus: Secondary | ICD-10-CM

## 2024-06-11 NOTE — Progress Notes (Signed)
 "    Subjective:   Monique Pruitt is a 34 y.o. G2P1001 at [redacted]w[redacted]d by 6wk US  being seen today for her first obstetrical visit.  Her obstetrical history is significant for prior CS, hx HSV, Rh neg. Patient does intend to breast feed. Pregnancy history fully reviewed.  Patient reports doing well overall. Recently moved to [place], [place] so would like to deliver in [place]. Is in the process of finding an OB with [facility].  HISTORY: OB History  Gravida Para Term Preterm AB Living  2 1 1  0 0 1  SAB IAB Ectopic Multiple Live Births  0 0 0 0 1    # Outcome Date GA Lbr Len/2nd Weight Sex Type Anes PTL Lv  2 Current           1 Term 12/14/22 [redacted]w[redacted]d  7 lb 8.6 oz (3.42 kg) F CS-LTranv Spinal  LIV     Name: Monique Pruitt     Apgar1: 8  Apgar5: 9     Last pap smear: Lab Results  Component Value Date   DIAGPAP  10/10/2021    - Negative for intraepithelial lesion or malignancy (NILM)    Past Medical History:  Diagnosis Date   Depression    Genital HSV    Migraine without aura 03/15/2021   Past Surgical History:  Procedure Laterality Date   CESAREAN SECTION N/A 12/14/2022   Procedure: CESAREAN SECTION;  Surgeon: Eldonna Suzen Octave, MD;  Location: [facility] LD ORS;  Service: Obstetrics;  Laterality: N/A;   WISDOM TOOTH EXTRACTION     Family History  Problem Relation Age of Onset   Endometriosis Mother    Ovarian cancer Maternal Grandmother    Social History[1] Allergies[2] Medications Ordered Prior to Encounter[3]  Exam   Vitals:   06/11/24 1535  BP: 119/76  Pulse: (!) 102  Weight: 157 lb (71.2 kg)   Fetal Heart Rate (bpm): 155  General:  Alert, oriented and cooperative. Patient is in no acute distress.  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 Supervision of other normal pregnancy, antepartum (Primary) 2. [redacted] weeks gestation of pregnancy 3. Screening for diabetes mellitus Initial labs drawn. Continue prenatal vitamins. Genetic Screening discussed: NIPS, carrier screening and AFP, declined. Ultrasound discussed; fetal anatomic survey: ordered. Problem list reviewed and updated. The nature of [facility] with multiple MDs and other Advanced Practice Providers was explained to patient; also emphasized that residents, students are part of our team. Routine obstetric precautions reviewed. - CBC/D/Plt+RPR+Rh+ABO+RubIgG... - US  MFM OB COMP + 14 WK; Future - Hemoglobin A1c  4. History of C-section G1 breech MOD to be discussed  5. History of HSV Suppression at 36 weeks  6. Rh negative state in antepartum period Rhogam at 28 weeks  Return in about 4 weeks (around 07/09/2024) for return OB at 19-20 weeks.  Kieth Carolin, MD Obstetrician & Gynecologist, [facility] for [facility], [facility]     [1]  Social History Tobacco Use   Smoking status: Never   Smokeless tobacco: Never  Vaping Use   Vaping status: Never Used  Substance  Use Topics   Alcohol use: Not Currently    Comment: occ   Drug use: Never  [2] No Known Allergies [3]  Current Outpatient Medications on File Prior to Visit  Medication Sig Dispense Refill   Prenatal Vit-Fe Fumarate-FA (PRENATAL VITAMIN PO) Take 1 tablet by mouth in the morning.     Riboflavin (VITAMIN B-2 PO) Take by mouth.     No current facility-administered medications on file prior to visit.   "

## 2024-06-12 LAB — CBC/D/PLT+RPR+RH+ABO+RUBIGG...
Antibody Screen: NEGATIVE
Basophils Absolute: 0 10*3/uL (ref 0.0–0.2)
Basos: 0 %
EOS (ABSOLUTE): 0.1 10*3/uL (ref 0.0–0.4)
Eos: 1 %
HCV Ab: NONREACTIVE
HIV Screen 4th Generation wRfx: NONREACTIVE
Hematocrit: 38.1 % (ref 34.0–46.6)
Hemoglobin: 12.7 g/dL (ref 11.1–15.9)
Hepatitis B Surface Ag: NEGATIVE
Immature Grans (Abs): 0 10*3/uL (ref 0.0–0.1)
Immature Granulocytes: 0 %
Lymphocytes Absolute: 2 10*3/uL (ref 0.7–3.1)
Lymphs: 25 %
MCH: 29.8 pg (ref 26.6–33.0)
MCHC: 33.3 g/dL (ref 31.5–35.7)
MCV: 89 fL (ref 79–97)
Monocytes Absolute: 0.5 10*3/uL (ref 0.1–0.9)
Monocytes: 6 %
Neutrophils Absolute: 5.4 10*3/uL (ref 1.4–7.0)
Neutrophils: 67 %
Platelets: 197 10*3/uL (ref 150–450)
RBC: 4.26 x10E6/uL (ref 3.77–5.28)
RDW: 12.8 % (ref 11.7–15.4)
RPR Ser Ql: NONREACTIVE
Rh Factor: NEGATIVE
Rubella Antibodies, IGG: 4.9 {index}
WBC: 8.1 10*3/uL (ref 3.4–10.8)

## 2024-06-12 LAB — HEMOGLOBIN A1C
Est. average glucose Bld gHb Est-mCnc: 100 mg/dL
Hgb A1c MFr Bld: 5.1 % (ref 4.8–5.6)

## 2024-06-12 LAB — HCV INTERPRETATION

## 2024-06-13 ENCOUNTER — Ambulatory Visit: Payer: Self-pay | Admitting: Obstetrics and Gynecology

## 2024-06-13 DIAGNOSIS — Z3480 Encounter for supervision of other normal pregnancy, unspecified trimester: Secondary | ICD-10-CM

## 2024-07-15 ENCOUNTER — Encounter: Admitting: Obstetrics and Gynecology

## 2024-07-22 ENCOUNTER — Ambulatory Visit

## 2024-08-12 ENCOUNTER — Encounter: Admitting: Obstetrics and Gynecology
